# Patient Record
Sex: Male | Born: 1975 | Race: Black or African American | Hispanic: No | Marital: Single
Health system: Northeastern US, Academic
[De-identification: ages and names within clinical notes are randomized; demographics above are authoritative.]

## PROBLEM LIST (undated history)

## (undated) DIAGNOSIS — F32A Depression, unspecified: Secondary | ICD-10-CM

## (undated) DIAGNOSIS — F329 Major depressive disorder, single episode, unspecified: Secondary | ICD-10-CM

## (undated) DIAGNOSIS — R45851 Suicidal ideations: Secondary | ICD-10-CM

## (undated) DIAGNOSIS — F419 Anxiety disorder, unspecified: Secondary | ICD-10-CM

## (undated) DIAGNOSIS — F209 Schizophrenia, unspecified: Secondary | ICD-10-CM

## (undated) DIAGNOSIS — F3110 Bipolar disorder, current episode manic without psychotic features, unspecified: Secondary | ICD-10-CM

## (undated) HISTORY — PX: NO PAST SURGERIES: SHX2092

## (undated) HISTORY — DX: Anxiety disorder, unspecified: F41.9

## (undated) HISTORY — DX: Schizophrenia, unspecified: F20.9

## (undated) HISTORY — DX: Bipolar disorder, current episode manic without psychotic features, unspecified: F31.10

---

## 1997-10-25 ENCOUNTER — Emergency Department (HOSPITAL_COMMUNITY): Admission: EM | Admit: 1997-10-25 | Discharge: 1997-10-25 | Payer: Self-pay | Admitting: Emergency Medicine

## 2004-10-26 ENCOUNTER — Emergency Department (HOSPITAL_COMMUNITY): Admission: EM | Admit: 2004-10-26 | Discharge: 2004-10-26 | Payer: Self-pay | Admitting: Emergency Medicine

## 2004-12-25 ENCOUNTER — Emergency Department (HOSPITAL_COMMUNITY): Admission: EM | Admit: 2004-12-25 | Discharge: 2004-12-25 | Payer: Self-pay | Admitting: Emergency Medicine

## 2005-04-18 ENCOUNTER — Emergency Department (HOSPITAL_COMMUNITY): Admission: EM | Admit: 2005-04-18 | Discharge: 2005-04-18 | Payer: Self-pay | Admitting: Emergency Medicine

## 2005-06-05 ENCOUNTER — Emergency Department (HOSPITAL_COMMUNITY): Admission: EM | Admit: 2005-06-05 | Discharge: 2005-06-06 | Payer: Self-pay | Admitting: Emergency Medicine

## 2005-07-28 ENCOUNTER — Emergency Department (HOSPITAL_COMMUNITY): Admission: EM | Admit: 2005-07-28 | Discharge: 2005-07-28 | Payer: Self-pay | Admitting: Emergency Medicine

## 2007-07-17 ENCOUNTER — Other Ambulatory Visit: Payer: Self-pay

## 2007-07-17 ENCOUNTER — Inpatient Hospital Stay (HOSPITAL_COMMUNITY): Admission: AD | Admit: 2007-07-17 | Discharge: 2007-07-23 | Payer: Self-pay | Admitting: Psychiatry

## 2007-07-17 ENCOUNTER — Ambulatory Visit: Payer: Self-pay | Admitting: Psychiatry

## 2007-07-17 ENCOUNTER — Other Ambulatory Visit: Payer: Self-pay | Admitting: *Deleted

## 2007-07-28 ENCOUNTER — Emergency Department (HOSPITAL_COMMUNITY): Admission: EM | Admit: 2007-07-28 | Discharge: 2007-07-28 | Payer: Self-pay | Admitting: Emergency Medicine

## 2008-05-10 ENCOUNTER — Other Ambulatory Visit: Payer: Self-pay

## 2008-05-11 ENCOUNTER — Ambulatory Visit: Payer: Self-pay | Admitting: Psychiatry

## 2008-05-11 ENCOUNTER — Inpatient Hospital Stay (HOSPITAL_COMMUNITY): Admission: AD | Admit: 2008-05-11 | Discharge: 2008-05-15 | Payer: Self-pay | Admitting: Psychiatry

## 2008-05-18 ENCOUNTER — Emergency Department (HOSPITAL_COMMUNITY): Admission: EM | Admit: 2008-05-18 | Discharge: 2008-05-18 | Payer: Self-pay | Admitting: Emergency Medicine

## 2008-05-19 ENCOUNTER — Ambulatory Visit: Payer: Self-pay | Admitting: *Deleted

## 2008-05-19 ENCOUNTER — Emergency Department (HOSPITAL_COMMUNITY): Admission: EM | Admit: 2008-05-19 | Discharge: 2008-05-19 | Payer: Self-pay | Admitting: Emergency Medicine

## 2008-05-19 ENCOUNTER — Inpatient Hospital Stay (HOSPITAL_COMMUNITY): Admission: AD | Admit: 2008-05-19 | Discharge: 2008-05-22 | Payer: Self-pay | Admitting: *Deleted

## 2008-06-01 ENCOUNTER — Emergency Department (HOSPITAL_COMMUNITY): Admission: EM | Admit: 2008-06-01 | Discharge: 2008-06-02 | Payer: Self-pay | Admitting: Emergency Medicine

## 2008-06-07 ENCOUNTER — Emergency Department (HOSPITAL_COMMUNITY): Admission: EM | Admit: 2008-06-07 | Discharge: 2008-06-08 | Payer: Self-pay | Admitting: Emergency Medicine

## 2009-03-21 ENCOUNTER — Other Ambulatory Visit: Payer: Self-pay | Admitting: Emergency Medicine

## 2009-03-21 ENCOUNTER — Inpatient Hospital Stay (HOSPITAL_COMMUNITY): Admission: RE | Admit: 2009-03-21 | Discharge: 2009-03-29 | Payer: Self-pay | Admitting: Psychiatry

## 2009-03-21 ENCOUNTER — Ambulatory Visit: Payer: Self-pay | Admitting: Psychiatry

## 2009-04-21 ENCOUNTER — Inpatient Hospital Stay (HOSPITAL_COMMUNITY): Admission: RE | Admit: 2009-04-21 | Discharge: 2009-04-27 | Payer: Self-pay | Admitting: Psychiatry

## 2009-04-21 ENCOUNTER — Ambulatory Visit: Payer: Self-pay | Admitting: Psychiatry

## 2009-08-26 ENCOUNTER — Ambulatory Visit: Payer: Self-pay | Admitting: Psychiatry

## 2009-08-26 ENCOUNTER — Inpatient Hospital Stay (HOSPITAL_COMMUNITY): Admission: RE | Admit: 2009-08-26 | Discharge: 2009-09-03 | Payer: Self-pay | Admitting: Psychiatry

## 2009-09-10 ENCOUNTER — Other Ambulatory Visit: Payer: Self-pay | Admitting: Emergency Medicine

## 2009-09-10 ENCOUNTER — Ambulatory Visit (HOSPITAL_COMMUNITY): Admission: RE | Admit: 2009-09-10 | Discharge: 2009-09-10 | Payer: Self-pay | Admitting: Psychiatry

## 2009-09-12 ENCOUNTER — Inpatient Hospital Stay (HOSPITAL_COMMUNITY): Admission: RE | Admit: 2009-09-12 | Discharge: 2009-09-15 | Payer: Self-pay | Admitting: Psychiatry

## 2009-10-08 ENCOUNTER — Emergency Department (HOSPITAL_COMMUNITY): Admission: EM | Admit: 2009-10-08 | Discharge: 2009-10-08 | Payer: Self-pay | Admitting: Emergency Medicine

## 2009-10-15 ENCOUNTER — Other Ambulatory Visit: Payer: Self-pay | Admitting: Emergency Medicine

## 2009-10-15 ENCOUNTER — Ambulatory Visit (HOSPITAL_COMMUNITY): Admission: RE | Admit: 2009-10-15 | Discharge: 2009-10-15 | Payer: Self-pay | Admitting: Psychiatry

## 2009-10-20 ENCOUNTER — Inpatient Hospital Stay (HOSPITAL_COMMUNITY): Admission: RE | Admit: 2009-10-20 | Discharge: 2009-10-23 | Payer: Self-pay | Admitting: Psychiatry

## 2009-10-20 ENCOUNTER — Ambulatory Visit: Payer: Self-pay | Admitting: Psychiatry

## 2009-11-05 ENCOUNTER — Emergency Department (HOSPITAL_COMMUNITY): Admission: EM | Admit: 2009-11-05 | Discharge: 2009-11-05 | Payer: Self-pay | Admitting: Emergency Medicine

## 2009-11-07 ENCOUNTER — Emergency Department (HOSPITAL_COMMUNITY): Admission: EM | Admit: 2009-11-07 | Discharge: 2009-11-07 | Payer: Self-pay | Admitting: Emergency Medicine

## 2009-11-10 ENCOUNTER — Ambulatory Visit (HOSPITAL_COMMUNITY): Admission: RE | Admit: 2009-11-10 | Discharge: 2009-11-10 | Payer: Self-pay | Admitting: Psychiatry

## 2009-11-16 ENCOUNTER — Emergency Department (HOSPITAL_COMMUNITY)
Admission: EM | Admit: 2009-11-16 | Discharge: 2009-11-18 | Payer: Self-pay | Source: Home / Self Care | Admitting: Emergency Medicine

## 2009-11-16 ENCOUNTER — Ambulatory Visit (HOSPITAL_COMMUNITY)
Admission: RE | Admit: 2009-11-16 | Discharge: 2009-11-16 | Payer: Self-pay | Source: Home / Self Care | Admitting: Psychiatry

## 2009-11-18 ENCOUNTER — Emergency Department (HOSPITAL_COMMUNITY): Admission: EM | Admit: 2009-11-18 | Discharge: 2009-11-19 | Payer: Self-pay | Admitting: Emergency Medicine

## 2009-11-20 ENCOUNTER — Emergency Department (HOSPITAL_COMMUNITY)
Admission: EM | Admit: 2009-11-20 | Discharge: 2009-11-21 | Payer: Self-pay | Source: Home / Self Care | Admitting: Emergency Medicine

## 2010-02-26 ENCOUNTER — Emergency Department (HOSPITAL_COMMUNITY)
Admission: EM | Admit: 2010-02-26 | Discharge: 2010-02-26 | Discharge status: Against Medical Advice | Payer: Self-pay | Attending: Emergency Medicine | Admitting: Emergency Medicine

## 2010-02-26 DIAGNOSIS — Z0389 Encounter for observation for other suspected diseases and conditions ruled out: Secondary | ICD-10-CM | POA: Insufficient documentation

## 2010-03-16 LAB — SEDIMENTATION RATE: Sed Rate: 1 mm/hr (ref 0–16)

## 2010-03-16 LAB — DIFFERENTIAL
Basophils Absolute: 0.1 10*3/uL (ref 0.0–0.1)
Basophils Absolute: 0 10*3/uL (ref 0.0–0.1)
Basophils Relative: 1 % (ref 0–1)
Basophils Relative: 1 % (ref 0–1)
Eosinophils Absolute: 0.1 10*3/uL (ref 0.0–0.7)
Eosinophils Relative: 2 % (ref 0–5)
Lymphocytes Relative: 42 % (ref 12–46)
Neutro Abs: 2.8 10*3/uL (ref 1.7–7.7)
Neutrophils Relative %: 47 % (ref 43–77)
Neutrophils Relative %: 53 % (ref 43–77)

## 2010-03-16 LAB — POCT I-STAT, CHEM 8
Calcium, Ion: 1.14 mmol/L (ref 1.12–1.32)
HCT: 52 % (ref 39.0–52.0)
Hemoglobin: 17.7 g/dL — ABNORMAL HIGH (ref 13.0–17.0)
Sodium: 139 mEq/L (ref 135–145)
TCO2: 25 mmol/L (ref 0–100)

## 2010-03-16 LAB — COMPREHENSIVE METABOLIC PANEL
Alkaline Phosphatase: 63 U/L (ref 39–117)
BUN: 9 mg/dL (ref 6–23)
CO2: 27 mEq/L (ref 19–32)
Chloride: 105 mEq/L (ref 96–112)
Creatinine, Ser: 0.97 mg/dL (ref 0.4–1.5)
GFR calc non Af Amer: >60 mL/min (ref >60)
Glucose, Bld: 94 mg/dL (ref 70–99)
Potassium: 3.9 mEq/L (ref 3.5–5.1)
Total Bilirubin: 0.8 mg/dL (ref 0.3–1.2)

## 2010-03-16 LAB — RAPID URINE DRUG SCREEN, HOSP PERFORMED
Amphetamines: NOT DETECTED
Barbiturates: NOT DETECTED
Benzodiazepines: NOT DETECTED
Benzodiazepines: NOT DETECTED
Cocaine: NOT DETECTED
Cocaine: NOT DETECTED

## 2010-03-16 LAB — BASIC METABOLIC PANEL
BUN: 9 mg/dL (ref 6–23)
CO2: 28 mEq/L (ref 19–32)
Calcium: 9.9 mg/dL (ref 8.4–10.5)
Chloride: 108 mEq/L (ref 96–112)
Creatinine, Ser: 0.9 mg/dL (ref 0.4–1.5)
GFR calc Af Amer: >60 mL/min (ref >60)

## 2010-03-16 LAB — CBC
HCT: 45.4 % (ref 39.0–52.0)
Hemoglobin: 15.5 g/dL (ref 13.0–17.0)
MCH: 30.8 pg (ref 26.0–34.0)
MCH: 31.2 pg (ref 26.0–34.0)
MCV: 89.9 fL (ref 78.0–100.0)
MCV: 89.9 fL (ref 78.0–100.0)
Platelets: 166 10*3/uL (ref 150–400)
Platelets: 162 10*3/uL (ref 150–400)
RBC: 5.05 MIL/uL (ref 4.22–5.81)
RBC: 5.13 MIL/uL (ref 4.22–5.81)
RDW: 13.9 % (ref 11.5–15.5)
WBC: 6 10*3/uL (ref 4.0–10.5)

## 2010-03-16 LAB — VALPROIC ACID LEVEL: Valproic Acid Lvl: <10 ug/mL — ABNORMAL LOW (ref 50.0–100.0)

## 2010-03-16 LAB — ETHANOL: Alcohol, Ethyl (B): <5 mg/dL (ref 0–10)

## 2010-03-18 LAB — DIFFERENTIAL
Basophils Absolute: 0 10*3/uL (ref 0.0–0.1)
Basophils Relative: 1 % (ref 0–1)
Eosinophils Absolute: 0.1 10*3/uL (ref 0.0–0.7)
Monocytes Absolute: 0.4 10*3/uL (ref 0.1–1.0)
Neutro Abs: 3.2 10*3/uL (ref 1.7–7.7)
Neutrophils Relative %: 55 % (ref 43–77)

## 2010-03-18 LAB — CBC
HCT: 41.6 % (ref 39.0–52.0)
HCT: 44 % (ref 39.0–52.0)
Hemoglobin: 14.3 g/dL (ref 13.0–17.0)
Hemoglobin: 15.2 g/dL (ref 13.0–17.0)
MCH: 31.2 pg (ref 26.0–34.0)
MCHC: 34.4 g/dL (ref 30.0–36.0)
MCHC: 34.5 g/dL (ref 30.0–36.0)
RBC: 4.57 MIL/uL (ref 4.22–5.81)
RBC: 4.87 MIL/uL (ref 4.22–5.81)
RDW: 14.4 % (ref 11.5–15.5)
WBC: 5.3 10*3/uL (ref 4.0–10.5)
WBC: 5.8 10*3/uL (ref 4.0–10.5)

## 2010-03-18 LAB — ETHANOL
Alcohol, Ethyl (B): <5 mg/dL (ref 0–10)
Alcohol, Ethyl (B): <5 mg/dL (ref 0–10)

## 2010-03-18 LAB — BASIC METABOLIC PANEL
Chloride: 104 mEq/L (ref 96–112)
Creatinine, Ser: 1 mg/dL (ref 0.4–1.5)
GFR calc Af Amer: >60 mL/min (ref >60)
Potassium: 3.7 mEq/L (ref 3.5–5.1)
Sodium: 138 mEq/L (ref 135–145)

## 2010-03-18 LAB — RAPID URINE DRUG SCREEN, HOSP PERFORMED
Amphetamines: NOT DETECTED
Barbiturates: NOT DETECTED
Cocaine: NOT DETECTED
Opiates: NOT DETECTED
Tetrahydrocannabinol: NOT DETECTED

## 2010-03-18 LAB — COMPREHENSIVE METABOLIC PANEL
ALT: 25 U/L (ref 0–53)
AST: 29 U/L (ref 0–37)
Alkaline Phosphatase: 57 U/L (ref 39–117)
CO2: 26 mEq/L (ref 19–32)
Chloride: 109 mEq/L (ref 96–112)
GFR calc Af Amer: >60 mL/min (ref >60)
GFR calc non Af Amer: >60 mL/min (ref >60)
Glucose, Bld: 100 mg/dL — ABNORMAL HIGH (ref 70–99)
Potassium: 3.6 mEq/L (ref 3.5–5.1)
Sodium: 141 mEq/L (ref 135–145)
Total Bilirubin: 0.7 mg/dL (ref 0.3–1.2)

## 2010-03-18 LAB — VALPROIC ACID LEVEL: Valproic Acid Lvl: <10 ug/mL — ABNORMAL LOW (ref 50.0–100.0)

## 2010-03-19 LAB — CBC
Hemoglobin: 14.9 g/dL (ref 13.0–17.0)
MCH: 31.3 pg (ref 26.0–34.0)
MCHC: 34.7 g/dL (ref 30.0–36.0)
MCV: 90.3 fL (ref 78.0–100.0)
Platelets: 137 10*3/uL — ABNORMAL LOW (ref 150–400)
RBC: 4.75 MIL/uL (ref 4.22–5.81)

## 2010-03-19 LAB — VALPROIC ACID LEVEL: Valproic Acid Lvl: 89.4 ug/mL (ref 50.0–100.0)

## 2010-03-19 LAB — URINALYSIS, ROUTINE W REFLEX MICROSCOPIC
Bilirubin Urine: NEGATIVE
Hgb urine dipstick: NEGATIVE
Nitrite: NEGATIVE
Specific Gravity, Urine: 1.022 (ref 1.005–1.030)
Urobilinogen, UA: 0.2 mg/dL (ref 0.0–1.0)

## 2010-03-19 LAB — URINE DRUGS OF ABUSE SCREEN W ALC, ROUTINE (REF LAB)
Benzodiazepines.: NEGATIVE
Ethyl Alcohol: <10 mg/dL (ref <10)
Marijuana Metabolite: NEGATIVE
Opiate Screen, Urine: NEGATIVE
Phencyclidine (PCP): NEGATIVE
Propoxyphene: NEGATIVE

## 2010-03-19 LAB — HEPATIC FUNCTION PANEL
Albumin: 3.6 g/dL (ref 3.5–5.2)
Indirect Bilirubin: 0.6 mg/dL (ref 0.3–0.9)
Total Protein: 6.9 g/dL (ref 6.0–8.3)

## 2010-03-19 LAB — COMPREHENSIVE METABOLIC PANEL
BUN: 14 mg/dL (ref 6–23)
CO2: 27 mEq/L (ref 19–32)
Calcium: 9 mg/dL (ref 8.4–10.5)
Chloride: 109 mEq/L (ref 96–112)
Creatinine, Ser: 1.02 mg/dL (ref 0.4–1.5)
GFR calc non Af Amer: >60 mL/min (ref >60)
Total Bilirubin: 0.7 mg/dL (ref 0.3–1.2)

## 2010-03-23 LAB — CBC
HCT: 44 % (ref 39.0–52.0)
MCV: 92 fL (ref 78.0–100.0)
Platelets: 165 10*3/uL (ref 150–400)
RBC: 4.78 MIL/uL (ref 4.22–5.81)
WBC: 5.3 10*3/uL (ref 4.0–10.5)

## 2010-03-23 LAB — COMPREHENSIVE METABOLIC PANEL
AST: 17 U/L (ref 0–37)
Albumin: 3.6 g/dL (ref 3.5–5.2)
Alkaline Phosphatase: 49 U/L (ref 39–117)
BUN: 12 mg/dL (ref 6–23)
CO2: 29 mEq/L (ref 19–32)
Chloride: 106 mEq/L (ref 96–112)
GFR calc non Af Amer: >60 mL/min (ref >60)
Potassium: 4.1 mEq/L (ref 3.5–5.1)
Total Bilirubin: 0.7 mg/dL (ref 0.3–1.2)

## 2010-03-28 LAB — URINALYSIS, ROUTINE W REFLEX MICROSCOPIC
Nitrite: NEGATIVE
Specific Gravity, Urine: 1.038 — ABNORMAL HIGH (ref 1.005–1.030)
Urobilinogen, UA: 1 mg/dL (ref 0.0–1.0)

## 2010-03-28 LAB — BASIC METABOLIC PANEL
CO2: 29 mEq/L (ref 19–32)
Calcium: 9 mg/dL (ref 8.4–10.5)
Creatinine, Ser: 0.86 mg/dL (ref 0.4–1.5)
GFR calc Af Amer: >60 mL/min (ref >60)
GFR calc non Af Amer: >60 mL/min (ref >60)
Glucose, Bld: 102 mg/dL — ABNORMAL HIGH (ref 70–99)

## 2010-03-28 LAB — RAPID URINE DRUG SCREEN, HOSP PERFORMED
Barbiturates: NOT DETECTED
Opiates: NOT DETECTED
Tetrahydrocannabinol: NOT DETECTED

## 2010-03-28 LAB — DIFFERENTIAL
Basophils Absolute: 0.1 10*3/uL (ref 0.0–0.1)
Basophils Relative: 1 % (ref 0–1)
Neutro Abs: 6.2 10*3/uL (ref 1.7–7.7)
Neutrophils Relative %: 69 % (ref 43–77)

## 2010-03-28 LAB — CBC
MCHC: 33.4 g/dL (ref 30.0–36.0)
RDW: 13.6 % (ref 11.5–15.5)

## 2010-04-13 LAB — DIFFERENTIAL
Basophils Relative: 1 % (ref 0–1)
Basophils Relative: 1 % (ref 0–1)
Eosinophils Absolute: 0.3 10*3/uL (ref 0.0–0.7)
Eosinophils Relative: 4 % (ref 0–5)
Lymphocytes Relative: 28 % (ref 12–46)
Lymphocytes Relative: 45 % (ref 12–46)
Lymphs Abs: 3 10*3/uL (ref 0.7–4.0)
Lymphs Abs: 2.5 10*3/uL (ref 0.7–4.0)
Monocytes Absolute: 0.5 10*3/uL (ref 0.1–1.0)
Monocytes Absolute: 0.6 10*3/uL (ref 0.1–1.0)
Monocytes Relative: 8 % (ref 3–12)
Monocytes Relative: 10 % (ref 3–12)
Neutro Abs: 3.8 10*3/uL (ref 1.7–7.7)
Neutro Abs: 2.7 10*3/uL (ref 1.7–7.7)

## 2010-04-13 LAB — URINALYSIS, ROUTINE W REFLEX MICROSCOPIC
Bilirubin Urine: NEGATIVE
Bilirubin Urine: NEGATIVE
Hgb urine dipstick: NEGATIVE
Ketones, ur: NEGATIVE mg/dL
Nitrite: NEGATIVE
Specific Gravity, Urine: 1.026 (ref 1.005–1.030)
Urobilinogen, UA: 1 mg/dL (ref 0.0–1.0)
pH: 5.5 (ref 5.0–8.0)

## 2010-04-13 LAB — POCT I-STAT, CHEM 8
BUN: 18 mg/dL (ref 6–23)
Chloride: 105 mEq/L (ref 96–112)
HCT: 48 % (ref 39.0–52.0)
Sodium: 142 mEq/L (ref 135–145)
TCO2: 27 mmol/L (ref 0–100)

## 2010-04-13 LAB — CBC
HCT: 44.8 % (ref 39.0–52.0)
HCT: 40.6 % (ref 39.0–52.0)
Hemoglobin: 15.6 g/dL (ref 13.0–17.0)
Hemoglobin: 14.3 g/dL (ref 13.0–17.0)
MCHC: 34.7 g/dL (ref 30.0–36.0)
MCHC: 35.2 g/dL (ref 30.0–36.0)
MCV: 89.6 fL (ref 78.0–100.0)
Platelets: 181 10*3/uL (ref 150–400)
RBC: 5.05 MIL/uL (ref 4.22–5.81)
RBC: 4.54 MIL/uL (ref 4.22–5.81)
WBC: 6.8 10*3/uL (ref 4.0–10.5)
WBC: 6 10*3/uL (ref 4.0–10.5)

## 2010-04-13 LAB — BASIC METABOLIC PANEL
BUN: 8 mg/dL (ref 6–23)
CO2: 30 mEq/L (ref 19–32)
CO2: 26 mEq/L (ref 19–32)
Calcium: 10 mg/dL (ref 8.4–10.5)
Calcium: 9.1 mg/dL (ref 8.4–10.5)
Chloride: 109 mEq/L (ref 96–112)
Creatinine, Ser: 0.84 mg/dL (ref 0.4–1.5)
GFR calc Af Amer: >60 mL/min (ref >60)
GFR calc Af Amer: >60 mL/min (ref >60)
Glucose, Bld: 82 mg/dL (ref 70–99)
Sodium: 147 mEq/L — ABNORMAL HIGH (ref 135–145)

## 2010-04-13 LAB — RAPID URINE DRUG SCREEN, HOSP PERFORMED
Amphetamines: NOT DETECTED
Amphetamines: NOT DETECTED
Barbiturates: NOT DETECTED
Benzodiazepines: NOT DETECTED
Benzodiazepines: NOT DETECTED
Cocaine: NOT DETECTED
Cocaine: NOT DETECTED
Opiates: NOT DETECTED
Opiates: NOT DETECTED
Tetrahydrocannabinol: NOT DETECTED
Tetrahydrocannabinol: NOT DETECTED

## 2010-04-13 LAB — ETHANOL: Alcohol, Ethyl (B): <5 mg/dL (ref 0–10)

## 2010-05-18 NOTE — H&P (Signed)
NAME:  Erik [name], Erik [name] NO.:  [id number]   MEDICAL RECORD NO.:  [id number]          PATIENT TYPE:  IPS   LOCATION:  0407                          FACILITY:  BH   PHYSICIAN:  Anselm Jungling, MD  DATE OF BIRTH:  [birth year]   DATE OF ADMISSION:  07/17/2007  DATE OF DISCHARGE:                       PSYCHIATRIC ADMISSION ASSESSMENT   This is a 35 year old male involuntarily committed on 07/17/07.   HISTORY OF PRESENT ILLNESS:  The patient is here on petition papers.  Papers state the patient is acutely psychotic, refusing labs and  threatening to leave and is clearly a danger to himself and others.  The  patient states that he brought himself here and he is here to help get  his money back.  Denies any hallucinations or suicidal thoughts.   PAST PSYCHIATRIC HISTORY:  First admission to [facility].  No current outpatient mental health treatment.   SOCIAL HISTORY:  A 35 year old male, homeless. Remainder of social  history is unclear.   FAMILY HISTORY:  Unknown.   ALCOHOL AND DRUG HISTORY:  Denies any alcohol or drug use.   PRIMARY CARE Larance Ratledge:  None.   MEDICAL PROBLEMS:  Denies any acute or chronic health issues.   MEDICATIONS:  None prior to arrival.   DRUG ALLERGIES:  No known allergies.   PHYSICAL EXAM:  GENERAL:  This is a healthy-appearing male who was  assessed at [facility] Emergency Department.  His physical exam was  reviewed.  VITAL SIGNS:  Temperature 97.4, 88 heart rate, 60 respirations, blood  pressure is 100/62, 5 feet 9 inches tall and 174 pounds, 99% saturated.   In the ER the patient was laughing to himself.  He was tangential and  malodorous.  He did receive Ativan.   LABORATORY DATA:  Shows a platelet count 142.  Acetaminophen level less  than 10.  Salicylate level less than four.  Urine drug screen is  negative.  Valproic acid less than 10, potassium of 3.4.   MENTAL STATUS EXAM:  The patient is resting in bed.  He  is fully alert,  good eye contact.  Thought process, he just makes a couple statements  about wanting to get his money back.  He seems aware of himself, unclear  about the situation.  He is a poor historian.   AXIS I:  Psychotic disorder NOS, rule out polysubstance abuse.  AXIS II:  Deferred.  AXIS III:  No known medical conditions.  AXIS IV:  Problems with housing and possible other psychosocial  problems.  AXIS V:  Current is 30.   PLAN:  Agricultural engineer.  Stabilize mood and thinking.  Will have Zyprexa  available at bedtime with some p.r.n. doses of Zyprexa and Ativan.  Will  continue to gather more history and identify supports. His tentative  length of stay at this time is consider to be 3-5 days.      Landry Corporal, N.P.      Anselm Jungling, MD  Electronically Signed    JO/MEDQ  D:  07/18/2007  T:  07/18/2007  Job:  [phone]

## 2010-05-18 NOTE — Discharge Summary (Signed)
NAME:  Erik, [name] NO.:  [id number]   MEDICAL RECORD NO.:  [id number]          PATIENT TYPE:  IPS   LOCATION:  0403                          FACILITY:  BH   PHYSICIAN:  Anselm Jungling, MD  DATE OF BIRTH:  [birth year]   DATE OF ADMISSION:  05/11/2008  DATE OF DISCHARGE:  05/15/2008                               DISCHARGE SUMMARY   IDENTIFYING DATA AND REASON FOR ADMISSION:  This was an inpatient  psychiatric admission for Erik [name], a 35 year old African American male who  was admitted with a psychotic disorder and history of schizophrenia.  Please refer to the admission note for further details pertaining to the  symptoms, circumstances, and history that led to his hospitalization.  He was given an initial Axis I diagnosis of schizophrenia versus  psychosis, NOS.   MEDICAL AND LABORATORY:  The patient was medically and physically  assessed by the psychiatric nurse practitioner.  He was in generally  good health without any active or chronic medical problems.  There were  no significant medical issues.   HOSPITAL COURSE:  The patient was admitted to the adult inpatient  psychiatric service.  He presented as a well-nourished, normally-  developed, adult male who was quite withdrawn, guarded, and preferred to  remain in bed during the first several days of his hospital stay.  He  was not very communicative.   He was initially evaluated by Dr. Geoffery Lyons.  The undersigned assumed  his care on the 3rd hospital day.  On that date, the patient was  somewhat groggy but reported that he felt that his medications were  satisfactory.  At that time, he was on a regimen of Haldol and Depakote.  Once he was awake for a few minutes, he did not appear sedated.  He  complained of low back pain for which he was given Lidoderm 5% dermal  patch with reasonably good results.   The patient continued somewhat isolative and uninterested in  participating in the milieu program.   He did get up for meals but  otherwise stayed to himself.   The patient was up, dressed, and active on the unit on the 4th and 5th  hospital days.  He was still not very forthcoming with personal  information and not easy to engage verbally but he indicated that he  felt better and felt ready for discharge back to the community.   We learned that he had been last seen at [facility]  in February of 2009.  Following that he had been referred to an  assertive community treatment team with whom he was not cooperative.  He  had another appointment at [facility] in March of  this year for followup subsequent to an inpatient psychiatric  hospitalization at [facility].  He no showed for that  appointment.   The patient agreed to the following discharge and aftercare plan.  The  patient was to follow up with [facility] with an  appointment to see their psychiatrist on June 03, 2008, at 8:30 a.m.  DISCHARGE MEDICATIONS:  1. Haldol 5 mg q.h.s.  2. Depakote 1000 mg q.h.s.  3. Cogentin 1 mg p.o. b.i.d.   DISCHARGE DIAGNOSES:  AXIS I:  Schizophrenia, chronic paranoid type,  acute exacerbation, resolving.  AXIS II:  Deferred.  AXIS III:  No acute or chronic illnesses.  AXIS IV:  Stressors, severe.  AXIS V:  Global Assessment of Functioning on discharge 50.      Anselm Jungling, MD  Electronically Signed     SPB/MEDQ  D:  05/19/2008  T:  05/19/2008  Job:  [id number]

## 2010-05-21 NOTE — Discharge Summary (Signed)
NAME:  Erik [name], Erik [name] NO.:  [id number]   MEDICAL RECORD NO.:  [id number]          PATIENT TYPE:  IPS   LOCATION:  0407                          FACILITY:  BH   PHYSICIAN:  Anselm Jungling, MD  DATE OF BIRTH:  [birth year]   DATE OF ADMISSION:  07/17/2007  DATE OF DISCHARGE:  07/23/2007                               DISCHARGE SUMMARY   IDENTIFYING DATA/REASON FOR ADMISSION:  The patient is a 35 year old  single African American male who is homeless.  He was admitted with  psychotic symptoms on an involuntary basis.  He had been on no  medications.  He was a poor historian.  Please refer to the admission  note for further details pertaining to the symptoms, circumstances and  history that led to his hospitalization.  He was given an initial Axis I  diagnosis of psychosis NOS.   MEDICAL AND LABORATORY:  The patient was medically and physically  assessed by the psychiatric nurse practitioner.  He was in good health  without any active or chronic medical problems.  There were no  significant medical issues.   HOSPITAL COURSE:  The patient was admitted to the adult inpatient  psychiatric service.  He presented as a thin, but adequately nourished  [place] male, who although alert and awake, and appearing to be  fully oriented, stated that the reason that he was hospitalized was to  get help getting my money.  He really had no insight into the reason  for his hospitalization initially, and felt that he was there in order  to get some sort of social work help with getting his disability  benefits for welfare money.   He generally was isolative and withdrawn during the initial portion of  his inpatient stay, preferring to remain in bed most of the time.   He was treated with a regimen of Zyprexa 5 mg q.h.s.  Because of the  apparent affective component to his disorder and his level of  irritability.  He was also placed on Depakote ER.  Over the course of  his hospital stay, he became less reclusive and isolative.  He  eventually was able to provide more specific and important history.  He  acknowledged that medication was helpful to him.  He stated that the  medications that he had been on both with [place], and in prior courses of  psychiatric treatment helps to calm everything down.   He told us that he was being followed by the [facility] community  support team, which is based in [place].  He was able to tell us the  name of his case manager there, and we were able to contact that  program.   Following this, the patient continued to improve, and he had no further  subjective complaints, denied psychotic symptoms, and sleep and appetite  were stable.  He agreed to the following aftercare plan.   AFTERCARE:  The patient was to follow-up with the [facility] ACT team.  In  fact, a representative from that agency was to pick the patient up from  our facility on the date of discharge, July 20, 2007.  They were to see  to provision of all of his outpatient treatment needs.   DISCHARGE MEDICATIONS:  1. Depakote ER 500 mg b.i.d.  2. Zyprexa 5 mg q.h.s..   DISCHARGE DIAGNOSIS:  AXIS I:  Bipolar disorder NOS.  AXIS II:  Deferred.  AXIS III:  No acute or chronic illnesses.  AXIS IV:  Stressors severe.  AXIS V:  GAF on discharge 50.      Anselm Jungling, MD  Electronically Signed     SPB/MEDQ  D:  08/13/2007  T:  08/13/2007  Job:  [id number]

## 2010-05-21 NOTE — Discharge Summary (Signed)
NAME:  KARINA, LENDERMAN NO.:  [id number]   MEDICAL RECORD NO.:  [id number]          PATIENT TYPE:  IPS   LOCATION:  0302                          FACILITY:  BH   PHYSICIAN:  Jasmine Pang, M.D. DATE OF BIRTH:  12/19/1975   DATE OF ADMISSION:  05/19/2008  DATE OF DISCHARGE:  05/22/2008                               DISCHARGE SUMMARY   IDENTIFICATION:  This is a 35 year old single African American male from  [place], who was admitted on a voluntary basis.   HISTORY OF PRESENT ILLNESS:  The patient was just released from [facility] a  weeks ago.  He went to the [employer].  He was sent from the homeless  shelter due to disorganized behavior and constant masturbation.  He  states he cannot take his medications at the homeless shelter because  people are watching.  He came to the emergency department and stated  that shelter was terrible.  He has no family support on him.  He stated  I am trying to get myself back together.  For further admission  information, see psychiatric admission assessment.   PHYSICAL FINDINGS:  There were no acute physical or medical problems  noted.  A complete physical exam was done in the ED prior to transfer to  our unit.   HOSPITAL COURSE:  Upon admission, the patient was started on his home  medications of Depakote 500 mg p.o. q.h.s.  He was also started on  Haldol 5 mg p.o. q.6 h. p.r.n. psychosis, and Cogentin 1 mg p.o. b.i.d.  He was also started on Ambien 10 mg p.o. q.h.s. p.r.n. and Ativan 1 mg  p.o. t.i.d. p.r.n. agitation.  In individual sessions, the patient  initially presented as disheveled with minimal eye contact.  There was  positive psychomotor retardation.  Speech was soft and slow.  Mood was  depressed and anxious.  Affect consistent with mood.  There was no  suicidal or homicidal ideation.  There was no evidence of psychosis or  thought disorder.  He stated that he would like an assisted-living  facility.  He stayed  in bed much of the time.  His sleep was good  however, and this hospitalization progressed.  His mood was less  depressed, less anxious.  His thoughts remained somewhat slowed and he  continued to focus on going to assisted living.  On May 22, 2008, mental  status had improved.  The patient excepted [facility].  He was  happy about this.  Transition mood was less depressed and anxious.  Affect was consistent with mood.  There was no suicidal or homicidal  ideation.  No thoughts of self-injurious behavior.  No auditory or  visual hallucinations.  No paranoia or delusions.  Thoughts were logical  and goal-directed, thought content.  No predominant theme.  Insight is  fair, judgment fair, impulse control good.  It was felt the patient was  safe for discharge today and was going to transition to [facility].   DISCHARGE DIAGNOSES:  Axis I:  Schizophrenia, paranoid type.  Axis II:  None.  Axis III:  No acute or chronic health problems.  Axis IV:  Severe (problems with primary support group, housing problem,  economic problem, burden of psychiatric illness, occupational problem).  Axis V:  Global assessment of functioning was 50 upon discharge.  GAF  was 35 upon admission.  GAF highest past year was 60-65.   DISCHARGE PLANS:  There was no specific activity level or dietary  restrictions.   POSTHOSPITAL CARE PLANS:  The patient will go to [facility] on June 03, 2008, at 8:30 a.m.   DISCHARGE MEDICATIONS:  1. Seroquel 50 mg twice a day and Seroquel 200 mg at bedtime.  2. Depakote 500 mg at bedtime.   Haldol was discontinued since the Seroquel was begun.      Jasmine Pang, M.D.  Electronically Signed     BHS/MEDQ  D:  05/24/2008  T:  05/24/2008  Job:  [id number]

## 2010-07-06 ENCOUNTER — Emergency Department (HOSPITAL_COMMUNITY)
Admission: EM | Admit: 2010-07-06 | Discharge: 2010-07-06 | Disposition: A | Discharge status: Home / Self Care | Payer: Medicaid Other | Attending: Emergency Medicine | Admitting: Emergency Medicine

## 2010-07-06 DIAGNOSIS — F329 Major depressive disorder, single episode, unspecified: Secondary | ICD-10-CM | POA: Insufficient documentation

## 2010-07-06 DIAGNOSIS — F3289 Other specified depressive episodes: Secondary | ICD-10-CM | POA: Insufficient documentation

## 2010-07-06 DIAGNOSIS — F411 Generalized anxiety disorder: Secondary | ICD-10-CM | POA: Insufficient documentation

## 2010-07-14 ENCOUNTER — Emergency Department (HOSPITAL_COMMUNITY)
Admission: EM | Admit: 2010-07-14 | Discharge: 2010-07-15 | Disposition: A | Discharge status: Other Acute Inpatient Hospital | Payer: Medicaid Other | Source: Home / Self Care | Attending: Emergency Medicine | Admitting: Emergency Medicine

## 2010-07-14 DIAGNOSIS — F3289 Other specified depressive episodes: Secondary | ICD-10-CM | POA: Insufficient documentation

## 2010-07-14 DIAGNOSIS — F329 Major depressive disorder, single episode, unspecified: Secondary | ICD-10-CM | POA: Insufficient documentation

## 2010-07-14 DIAGNOSIS — R45851 Suicidal ideations: Secondary | ICD-10-CM | POA: Insufficient documentation

## 2010-07-14 LAB — BASIC METABOLIC PANEL
CO2: 28 mEq/L (ref 19–32)
Calcium: 9.6 mg/dL (ref 8.4–10.5)
Creatinine, Ser: 0.77 mg/dL (ref 0.50–1.35)
GFR calc non Af Amer: >60 mL/min (ref >60)
Glucose, Bld: 85 mg/dL (ref 70–99)
Sodium: 140 mEq/L (ref 135–145)

## 2010-07-14 LAB — CBC
HCT: 44.3 % (ref 39.0–52.0)
Hemoglobin: 14.7 g/dL (ref 13.0–17.0)
MCH: 29.5 pg (ref 26.0–34.0)
MCHC: 33.2 g/dL (ref 30.0–36.0)
RDW: 12.9 % (ref 11.5–15.5)

## 2010-07-14 LAB — DIFFERENTIAL
Basophils Absolute: 0 10*3/uL (ref 0.0–0.1)
Basophils Relative: 1 % (ref 0–1)
Eosinophils Relative: 1 % (ref 0–5)
Monocytes Absolute: 0.5 10*3/uL (ref 0.1–1.0)
Monocytes Relative: 8 % (ref 3–12)
Neutro Abs: 3.9 10*3/uL (ref 1.7–7.7)

## 2010-07-14 LAB — ETHANOL: Alcohol, Ethyl (B): 15 mg/dL — ABNORMAL HIGH (ref 0–11)

## 2010-07-14 LAB — URINALYSIS, ROUTINE W REFLEX MICROSCOPIC
Glucose, UA: NEGATIVE mg/dL
Leukocytes, UA: NEGATIVE
Protein, ur: 30 mg/dL — AB
Urobilinogen, UA: 1 mg/dL (ref 0.0–1.0)

## 2010-07-14 LAB — RAPID URINE DRUG SCREEN, HOSP PERFORMED
Amphetamines: NOT DETECTED
Tetrahydrocannabinol: NOT DETECTED

## 2010-07-14 LAB — URINE MICROSCOPIC-ADD ON

## 2010-07-15 ENCOUNTER — Inpatient Hospital Stay (HOSPITAL_COMMUNITY)
Admission: AD | Admit: 2010-07-15 | Discharge: 2010-07-22 | DRG: 885 | Disposition: A | Discharge status: Home / Self Care | Payer: Medicaid Other | Source: Ambulatory Visit | Attending: Psychiatry | Admitting: Psychiatry

## 2010-07-15 DIAGNOSIS — Z5900 Homelessness unspecified: Secondary | ICD-10-CM

## 2010-07-15 DIAGNOSIS — Z91199 Patient's noncompliance with other medical treatment and regimen due to unspecified reason: Secondary | ICD-10-CM

## 2010-07-15 DIAGNOSIS — R45851 Suicidal ideations: Secondary | ICD-10-CM

## 2010-07-15 DIAGNOSIS — Z560 Unemployment, unspecified: Secondary | ICD-10-CM

## 2010-07-15 DIAGNOSIS — F39 Unspecified mood [affective] disorder: Principal | ICD-10-CM

## 2010-07-15 DIAGNOSIS — F339 Major depressive disorder, recurrent, unspecified: Secondary | ICD-10-CM

## 2010-07-15 DIAGNOSIS — Z9119 Patient's noncompliance with other medical treatment and regimen: Secondary | ICD-10-CM

## 2010-07-16 DIAGNOSIS — F39 Unspecified mood [affective] disorder: Secondary | ICD-10-CM

## 2010-07-22 ENCOUNTER — Emergency Department (HOSPITAL_COMMUNITY)
Admission: EM | Admit: 2010-07-22 | Discharge: 2010-07-23 | Discharge status: Against Medical Advice | Payer: Medicaid Other | Attending: Emergency Medicine | Admitting: Emergency Medicine

## 2010-07-22 DIAGNOSIS — R079 Chest pain, unspecified: Secondary | ICD-10-CM | POA: Insufficient documentation

## 2010-07-22 DIAGNOSIS — R51 Headache: Secondary | ICD-10-CM | POA: Insufficient documentation

## 2010-07-23 ENCOUNTER — Emergency Department (HOSPITAL_COMMUNITY): Payer: Medicaid Other

## 2010-07-27 NOTE — Discharge Summary (Signed)
  NAME:  Erik [name], Erik [name] NO.:  [id number]  MEDICAL RECORD NO.:  [id number]  LOCATION:  0400                          FACILITY:  BH  PHYSICIAN:  Eulogio Ditch, MD DATE OF BIRTH:  April 15, 1975  DATE OF ADMISSION:  07/15/2010 DATE OF DISCHARGE:  07/22/2010                              DISCHARGE SUMMARY   HISTORY OF PRESENT ILLNESS:  A 35 year old African American male with history of depression who came to the ER as he verbalized suicidal ideations for the last 2 weeks with a plan to overdose on pills.  The patient was also frustrated because he was homeless.  The patient reported he was on Zoloft and Zyprexa but was noncompliant with these medications.  HOSPITAL COURSE:  The patient was admitted to [facility] and was started on 100 mg p.o. daily and Zyprexa 5 mg p.o. daily.  The patient responded to these medications well without any side effects.  The patient was also given Cogentin 1 mg at bedtime to prevent any side effects like EPS from Zyprexa.  Psychoeducation given to the patient regarding the side effects of Zyprexa that it can cause irreversible diabetes, but the patient reported to me that he feels good on Zyprexa and he wanted to continue that.  The patient was advised to follow up regularly for his blood sugar levels with primary care physician or with psychiatrist.  Throughout the stay in the hospital the patient participated in all the groups.  No agitated behavior was reported by the staff.  His insight and judgment improved throughout the stay in the hospital.  On 07/22/2010, when I saw the patient, the patient was very logical and goal-directed, pleasant on approach.  Denied hearing any voices.  Denied any suicidal or homicidal ideations.  The patient denied any side effects from the medications.  The patient got a better  and wanted to go today, as the patient is stable the patient was discharged to follow up in the outpatient  setting.  DISCHARGE MEDICATIONS:  Zyprexa 5 mg p.o. daily,    Zoloft 100 mg p.o. daily.  Diagnosis; 1 Mood disorder nos. 2 deferred 3 no active medical issue.  DISCHARGE FOLLOWUP:  The patient is going to follow up at [facility]  at phone number [phone].     Eulogio Ditch, MD     SA/MEDQ  D:  07/22/2010  T:  07/22/2010  Job:  [id number]  Electronically Signed by Eulogio Ditch  on 07/27/2010 09:41:36 AM

## 2010-07-27 NOTE — Assessment & Plan Note (Signed)
  NAME:  Erik [name], TURVEY NO.:  [id number]  MEDICAL RECORD NO.:  [id number]  LOCATION:  0400                          FACILITY:  BH  PHYSICIAN:  Eulogio Ditch, MD DATE OF BIRTH:  05/18/1975  DATE OF ADMISSION:  07/15/2010 DATE OF DISCHARGE:                      PSYCHIATRIC ADMISSION ASSESSMENT   REASON FOR ADMISSION:  Depression and suicidal ideations.  HISTORY OF PRESENT ILLNESS:  A 35 year old, African American male with a history of depression who was admitted from the ER as he verbalized suicidal ideations for the last 2 weeks with a plan to overdose on the pills.  The patient reported he is frustrated and stressed out because of not working and homeless.  The patient also reported he was not taking his medication Zoloft and Zyprexa.  The patient has a number of suicidal threats in the past.  SUBSTANCE ABUSE:  The patient denied abusing any drugs.  PAST PSYCH HISTORY:  The patient has multiple hospitalizations in the past at behavioral health but I do not see any records in the E-chart at this time.  PAST MEDICATIONS:  Prozac, Zoloft, Zyprexa, last use was in September 2011.  MEDICAL HISTORY:  No active medical issues.  ALLERGIES:  NO KNOWN DRUG ALLERGIES.  PHYSICAL EXAMINATION:  Done at [facility] within normal limits.  VITAL SIGNS:  Blood pressure 116/81, pulse 74, respiratory rate 18.  WBC count 6.4, hemoglobin 14.7, alcohol level 15.  Sodium 140, potassium 3.1, BUN 9, creatinine 0.77, UDS negative.  Physical examination done by Dr. Eber Hong in [place] ER within normal limits.  The patient was given potassium chloride 40 mg in the ER. MENTAL STATUS EXAM:  The patient is calm, cooperative with the interview.  Fair eye contact.  Guarded.  Speech normal in rate, rhythm and volume.  MOOD:  Depressed.  Affect and mood congruent.  Thought process logical and goal-directed.  Thought content:  The patient still has suicidal ideation with a  plan to overdose on the pills, not delusional.  Denies hearing voices.  Is not internally preoccupied. Cognition:  Alert, awake, oriented x3.  Memory immediate, recent and remote fair.  Attention and concentration poor.  Abstractionability fair.  Insight and judgment poor.  DIAGNOSIS:  Axis I:  Mood disorder, not otherwise specified. Rule out major depressive disorder, recurrent type. Axis II:  Deferred. Axis III:  No active medical issue. Axis IV:  Psychosocial stressors, homeless, unemployed. Axis V:  40.  TREATMENT PLAN: 1. The patient will be started on Zoloft 100 mg p.o. daily and Zyprexa     5 mg at bedtime. 2. We will get more collateral information on the patient. 3. Estimated length of stay in the hospital will be 6-7 days. 4. Side effects, risks and benefits of the medication discussed with     the patient.     Eulogio Ditch, MD     SA/MEDQ  D:  07/16/2010  T:  07/16/2010  Job:  [id number]  Electronically Signed by Eulogio Ditch  on 07/27/2010 09:38:35 AM

## 2010-07-28 ENCOUNTER — Emergency Department (HOSPITAL_COMMUNITY)
Admission: EM | Admit: 2010-07-28 | Discharge: 2010-07-28 | Disposition: A | Discharge status: Home / Self Care | Payer: Medicaid Other | Attending: Emergency Medicine | Admitting: Emergency Medicine

## 2010-07-28 DIAGNOSIS — F319 Bipolar disorder, unspecified: Secondary | ICD-10-CM | POA: Insufficient documentation

## 2010-07-28 LAB — CBC
Hemoglobin: 14.8 g/dL (ref 13.0–17.0)
MCHC: 33.5 g/dL (ref 30.0–36.0)
RBC: 5.03 MIL/uL (ref 4.22–5.81)

## 2010-07-28 LAB — COMPREHENSIVE METABOLIC PANEL
Albumin: 4.1 g/dL (ref 3.5–5.2)
BUN: 11 mg/dL (ref 6–23)
Chloride: 102 mEq/L (ref 96–112)
Creatinine, Ser: 0.74 mg/dL (ref 0.50–1.35)
GFR calc Af Amer: >60 mL/min (ref >60)
Glucose, Bld: 105 mg/dL — ABNORMAL HIGH (ref 70–99)
Total Bilirubin: 0.7 mg/dL (ref 0.3–1.2)

## 2010-07-28 LAB — RAPID URINE DRUG SCREEN, HOSP PERFORMED
Amphetamines: NOT DETECTED
Cocaine: NOT DETECTED
Opiates: NOT DETECTED
Tetrahydrocannabinol: NOT DETECTED

## 2010-07-28 LAB — DIFFERENTIAL
Basophils Absolute: 0 10*3/uL (ref 0.0–0.1)
Basophils Relative: 1 % (ref 0–1)
Neutro Abs: 3.9 10*3/uL (ref 1.7–7.7)
Neutrophils Relative %: 59 % (ref 43–77)

## 2010-07-28 LAB — ETHANOL: Alcohol, Ethyl (B): <11 mg/dL (ref 0–11)

## 2010-08-12 ENCOUNTER — Emergency Department (HOSPITAL_COMMUNITY)
Admission: EM | Admit: 2010-08-12 | Discharge: 2010-08-13 | Disposition: A | Discharge status: Other Acute Inpatient Hospital | Payer: Medicaid Other | Attending: Emergency Medicine | Admitting: Emergency Medicine

## 2010-08-13 ENCOUNTER — Emergency Department (HOSPITAL_COMMUNITY): Payer: Medicaid Other

## 2010-08-13 ENCOUNTER — Emergency Department (HOSPITAL_COMMUNITY)
Admission: EM | Admit: 2010-08-13 | Discharge: 2010-08-13 | Disposition: A | Discharge status: Home / Self Care | Payer: Medicaid Other | Attending: Emergency Medicine | Admitting: Emergency Medicine

## 2010-08-13 DIAGNOSIS — IMO0001 Reserved for inherently not codable concepts without codable children: Secondary | ICD-10-CM | POA: Insufficient documentation

## 2010-08-13 DIAGNOSIS — F319 Bipolar disorder, unspecified: Secondary | ICD-10-CM | POA: Insufficient documentation

## 2010-08-13 LAB — URINALYSIS, ROUTINE W REFLEX MICROSCOPIC
Leukocytes, UA: NEGATIVE
Nitrite: NEGATIVE
Specific Gravity, Urine: 1.031 — ABNORMAL HIGH (ref 1.005–1.030)
Urobilinogen, UA: 1 mg/dL (ref 0.0–1.0)
pH: 6 (ref 5.0–8.0)

## 2010-08-13 LAB — RAPID URINE DRUG SCREEN, HOSP PERFORMED
Benzodiazepines: NOT DETECTED
Cocaine: NOT DETECTED

## 2010-08-13 LAB — BASIC METABOLIC PANEL
Chloride: 104 mEq/L (ref 96–112)
GFR calc Af Amer: >60 mL/min (ref >60)
GFR calc non Af Amer: >60 mL/min (ref >60)
Potassium: 3.7 mEq/L (ref 3.5–5.1)
Sodium: 142 mEq/L (ref 135–145)

## 2010-08-13 LAB — DIFFERENTIAL
Basophils Absolute: 0 10*3/uL (ref 0.0–0.1)
Basophils Relative: 1 % (ref 0–1)
Eosinophils Absolute: 0.1 10*3/uL (ref 0.0–0.7)
Lymphs Abs: 2.5 10*3/uL (ref 0.7–4.0)
Neutrophils Relative %: 42 % — ABNORMAL LOW (ref 43–77)

## 2010-08-13 LAB — CBC
MCV: 86.7 fL (ref 78.0–100.0)
Platelets: 182 10*3/uL (ref 150–400)
RBC: 4.83 MIL/uL (ref 4.22–5.81)
RDW: 13.8 % (ref 11.5–15.5)
WBC: 5.6 10*3/uL (ref 4.0–10.5)

## 2010-08-13 LAB — ETHANOL: Alcohol, Ethyl (B): <11 mg/dL (ref 0–11)

## 2010-08-17 ENCOUNTER — Emergency Department (HOSPITAL_COMMUNITY)
Admission: EM | Admit: 2010-08-17 | Discharge: 2010-08-17 | Disposition: A | Discharge status: Home / Self Care | Payer: Medicaid Other | Attending: Emergency Medicine | Admitting: Emergency Medicine

## 2010-08-17 DIAGNOSIS — F319 Bipolar disorder, unspecified: Secondary | ICD-10-CM | POA: Insufficient documentation

## 2010-08-17 DIAGNOSIS — Z760 Encounter for issue of repeat prescription: Secondary | ICD-10-CM | POA: Insufficient documentation

## 2010-08-18 ENCOUNTER — Emergency Department (HOSPITAL_COMMUNITY)
Admission: EM | Admit: 2010-08-18 | Discharge: 2010-08-18 | Disposition: A | Discharge status: Home / Self Care | Payer: Medicaid Other | Attending: Emergency Medicine | Admitting: Emergency Medicine

## 2010-08-18 DIAGNOSIS — Z760 Encounter for issue of repeat prescription: Secondary | ICD-10-CM | POA: Insufficient documentation

## 2010-08-18 DIAGNOSIS — F3130 Bipolar disorder, current episode depressed, mild or moderate severity, unspecified: Secondary | ICD-10-CM | POA: Insufficient documentation

## 2010-08-21 ENCOUNTER — Emergency Department (HOSPITAL_COMMUNITY)
Admission: EM | Admit: 2010-08-21 | Discharge: 2010-08-21 | Discharge status: Against Medical Advice | Payer: Medicaid Other | Attending: Emergency Medicine | Admitting: Emergency Medicine

## 2010-08-22 ENCOUNTER — Emergency Department (HOSPITAL_COMMUNITY)
Admission: EM | Admit: 2010-08-22 | Discharge: 2010-08-23 | Disposition: A | Discharge status: Home / Self Care | Payer: Medicaid Other | Attending: Emergency Medicine | Admitting: Emergency Medicine

## 2010-08-22 DIAGNOSIS — F319 Bipolar disorder, unspecified: Secondary | ICD-10-CM | POA: Insufficient documentation

## 2010-08-22 DIAGNOSIS — Z79899 Other long term (current) drug therapy: Secondary | ICD-10-CM | POA: Insufficient documentation

## 2010-08-22 DIAGNOSIS — IMO0001 Reserved for inherently not codable concepts without codable children: Secondary | ICD-10-CM | POA: Insufficient documentation

## 2010-08-22 DIAGNOSIS — F411 Generalized anxiety disorder: Secondary | ICD-10-CM | POA: Insufficient documentation

## 2010-08-22 LAB — CBC
HCT: 41.9 % (ref 39.0–52.0)
MCH: 29.9 pg (ref 26.0–34.0)
MCV: 88.2 fL (ref 78.0–100.0)
Platelets: 170 10*3/uL (ref 150–400)
RBC: 4.75 MIL/uL (ref 4.22–5.81)
WBC: 5.5 10*3/uL (ref 4.0–10.5)

## 2010-08-22 LAB — COMPREHENSIVE METABOLIC PANEL
AST: 22 U/L (ref 0–37)
BUN: 12 mg/dL (ref 6–23)
CO2: 28 mEq/L (ref 19–32)
Calcium: 9.2 mg/dL (ref 8.4–10.5)
Chloride: 106 mEq/L (ref 96–112)
Creatinine, Ser: 0.78 mg/dL (ref 0.50–1.35)
GFR calc Af Amer: >60 mL/min (ref >60)
GFR calc non Af Amer: >60 mL/min (ref >60)
Total Bilirubin: 0.2 mg/dL — ABNORMAL LOW (ref 0.3–1.2)

## 2010-08-22 LAB — DIFFERENTIAL
Eosinophils Absolute: 0.3 10*3/uL (ref 0.0–0.7)
Eosinophils Relative: 5 % (ref 0–5)
Lymphocytes Relative: 37 % (ref 12–46)
Lymphs Abs: 2 10*3/uL (ref 0.7–4.0)
Monocytes Relative: 10 % (ref 3–12)
Neutrophils Relative %: 48 % (ref 43–77)

## 2010-08-22 LAB — ETHANOL: Alcohol, Ethyl (B): <11 mg/dL (ref 0–11)

## 2010-08-22 LAB — RAPID URINE DRUG SCREEN, HOSP PERFORMED
Cocaine: NOT DETECTED
Opiates: NOT DETECTED

## 2010-08-27 ENCOUNTER — Emergency Department (HOSPITAL_COMMUNITY)
Admission: EM | Admit: 2010-08-27 | Discharge: 2010-08-27 | Disposition: A | Discharge status: Home / Self Care | Payer: Medicaid Other | Attending: Emergency Medicine | Admitting: Emergency Medicine

## 2010-08-27 DIAGNOSIS — Z9119 Patient's noncompliance with other medical treatment and regimen: Secondary | ICD-10-CM | POA: Insufficient documentation

## 2010-08-27 DIAGNOSIS — F319 Bipolar disorder, unspecified: Secondary | ICD-10-CM | POA: Insufficient documentation

## 2010-08-27 DIAGNOSIS — Z79899 Other long term (current) drug therapy: Secondary | ICD-10-CM | POA: Insufficient documentation

## 2010-08-27 DIAGNOSIS — Z91199 Patient's noncompliance with other medical treatment and regimen due to unspecified reason: Secondary | ICD-10-CM | POA: Insufficient documentation

## 2010-08-27 LAB — RAPID URINE DRUG SCREEN, HOSP PERFORMED
Amphetamines: NOT DETECTED
Barbiturates: NOT DETECTED
Benzodiazepines: NOT DETECTED
Cocaine: NOT DETECTED
Tetrahydrocannabinol: NOT DETECTED

## 2010-09-02 ENCOUNTER — Emergency Department (HOSPITAL_COMMUNITY)
Admission: EM | Admit: 2010-09-02 | Discharge: 2010-09-03 | Disposition: A | Discharge status: Other Acute Inpatient Hospital | Payer: Medicaid Other | Source: Home / Self Care | Attending: Emergency Medicine | Admitting: Emergency Medicine

## 2010-09-02 DIAGNOSIS — Z046 Encounter for general psychiatric examination, requested by authority: Secondary | ICD-10-CM | POA: Insufficient documentation

## 2010-09-02 DIAGNOSIS — Z5900 Homelessness unspecified: Secondary | ICD-10-CM | POA: Insufficient documentation

## 2010-09-02 DIAGNOSIS — F22 Delusional disorders: Secondary | ICD-10-CM | POA: Insufficient documentation

## 2010-09-02 LAB — RAPID URINE DRUG SCREEN, HOSP PERFORMED
Amphetamines: NOT DETECTED
Barbiturates: NOT DETECTED
Benzodiazepines: NOT DETECTED
Cocaine: NOT DETECTED
Opiates: NOT DETECTED
Tetrahydrocannabinol: NOT DETECTED

## 2010-09-02 LAB — CBC
HCT: 42.2 % (ref 39.0–52.0)
Hemoglobin: 14.2 g/dL (ref 13.0–17.0)
MCH: 29.6 pg (ref 26.0–34.0)
MCHC: 33.6 g/dL (ref 30.0–36.0)
MCV: 87.9 fL (ref 78.0–100.0)
Platelets: 182 K/uL (ref 150–400)
RBC: 4.8 MIL/uL (ref 4.22–5.81)
RDW: 13.8 % (ref 11.5–15.5)
WBC: 4.9 K/uL (ref 4.0–10.5)

## 2010-09-02 LAB — URINALYSIS, ROUTINE W REFLEX MICROSCOPIC
Bilirubin Urine: NEGATIVE
Glucose, UA: NEGATIVE mg/dL
Hgb urine dipstick: NEGATIVE
Ketones, ur: NEGATIVE mg/dL
Leukocytes, UA: NEGATIVE
Nitrite: NEGATIVE
Protein, ur: NEGATIVE mg/dL
Specific Gravity, Urine: 1.02 (ref 1.005–1.030)
Urobilinogen, UA: 1 mg/dL (ref 0.0–1.0)
pH: 6 (ref 5.0–8.0)

## 2010-09-02 LAB — DIFFERENTIAL
Basophils Absolute: 0 10*3/uL (ref 0.0–0.1)
Basophils Relative: 0 % (ref 0–1)
Eosinophils Absolute: 0.1 K/uL (ref 0.0–0.7)
Eosinophils Relative: 2 % (ref 0–5)
Lymphocytes Relative: 38 % (ref 12–46)
Lymphs Abs: 1.9 10*3/uL (ref 0.7–4.0)
Monocytes Absolute: 0.4 10*3/uL (ref 0.1–1.0)
Monocytes Relative: 9 % (ref 3–12)
Neutro Abs: 2.5 10*3/uL (ref 1.7–7.7)
Neutrophils Relative %: 51 % (ref 43–77)

## 2010-09-02 LAB — BASIC METABOLIC PANEL WITH GFR
CO2: 24 meq/L (ref 19–32)
Calcium: 10 mg/dL (ref 8.4–10.5)
Chloride: 107 meq/L (ref 96–112)
Glucose, Bld: 88 mg/dL (ref 70–99)
Sodium: 140 meq/L (ref 135–145)

## 2010-09-02 LAB — BASIC METABOLIC PANEL
BUN: 15 mg/dL (ref 6–23)
Creatinine, Ser: 0.62 mg/dL (ref 0.50–1.35)
GFR calc Af Amer: >60 mL/min (ref >60)
GFR calc non Af Amer: >60 mL/min (ref >60)
Potassium: 3.9 mEq/L (ref 3.5–5.1)

## 2010-09-02 LAB — ETHANOL: Alcohol, Ethyl (B): <11 mg/dL (ref 0–11)

## 2010-09-03 ENCOUNTER — Inpatient Hospital Stay (HOSPITAL_COMMUNITY)
Admission: RE | Admit: 2010-09-03 | Discharge: 2010-09-06 | DRG: 885 | Disposition: A | Discharge status: Home / Self Care | Payer: Medicaid Other | Source: Ambulatory Visit | Attending: Psychiatry | Admitting: Psychiatry

## 2010-09-03 DIAGNOSIS — F39 Unspecified mood [affective] disorder: Secondary | ICD-10-CM

## 2010-09-03 DIAGNOSIS — F200 Paranoid schizophrenia: Secondary | ICD-10-CM

## 2010-09-03 DIAGNOSIS — Z91199 Patient's noncompliance with other medical treatment and regimen due to unspecified reason: Secondary | ICD-10-CM

## 2010-09-03 DIAGNOSIS — Z5900 Homelessness unspecified: Secondary | ICD-10-CM

## 2010-09-03 DIAGNOSIS — Z9119 Patient's noncompliance with other medical treatment and regimen: Secondary | ICD-10-CM

## 2010-09-03 DIAGNOSIS — F29 Unspecified psychosis not due to a substance or known physiological condition: Principal | ICD-10-CM

## 2010-09-04 DIAGNOSIS — F29 Unspecified psychosis not due to a substance or known physiological condition: Secondary | ICD-10-CM

## 2010-09-08 NOTE — Discharge Summary (Signed)
  NAMEBILLYJOE, GO NO.:  [id number]  MEDICAL RECORD NO.:  [id number]  LOCATION:  0402                          FACILITY:  BH  PHYSICIAN:  Eulogio Ditch, MD DATE OF BIRTH:  07-20-1975  DATE OF ADMISSION:  09/03/2010 DATE OF DISCHARGE:  09/06/2010                              DISCHARGE SUMMARY   HISTORY OF PRESENT ILLNESS:  Please refer to initial psych assessment for details.  Briefly, a 35 year old African American male who was admitted on commitment papers.  The patient denied any suicidal ideations or hallucinations at the time of admission.  The patient stated that he was noncompliant with his medications.  HOSPITAL COURSE:  The patient was started on his medication Zyprexa 5 mg p.o. at bedtime.  Side effects of the medications likely irreversible diabetes and other side effects from metabolic syndrome were explained to the patient.  But the patient still wanted to be continued on Zyprexa.  The patient responded to the medication well without any side effects.  On 09/06/2010, the patient was seen in the treatment team.  The patient was logical and goal-directed, not hallucinating or delusional, not suicidal or homicidal.  The patient was motivated to follow up in the outpatient setting and wanted to be compliant with his treatment. Psychoeducation given to the patient regarding abusing alcohol.  When asking why the patient came to the hospital, the patient stated that he came to get some help for the placement.  Also, he was looking for a job.  DIAGNOSES:  AXIS I: Psychosis NOS, rule out schizophrenia paranoid type, rule out mood disorder NOS AXIS II: Deferred AXIS III: No active medical issue AXIS IV: Chronic mental health issues, psychosocial stressors AXIS V: 55  DISCHARGE MEDICATIONS:  Zyprexa 5 mg at bedtime  DISCHARGE FOLLOWUP:  The patient will follow up at [facility].  Appointment was given to the patient.     Eulogio Ditch,  MD     SA/MEDQ  D:  09/06/2010  T:  09/06/2010  Job:  [id number]  Electronically Signed by Eulogio Ditch  on 09/08/2010 02:59:49 PM
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
## 2010-09-09 ENCOUNTER — Emergency Department (HOSPITAL_COMMUNITY)
Admission: EM | Admit: 2010-09-09 | Discharge: 2010-09-09 | Disposition: A | Discharge status: Home / Self Care | Payer: Medicaid Other | Source: Home / Self Care | Attending: Emergency Medicine | Admitting: Emergency Medicine

## 2010-09-09 ENCOUNTER — Inpatient Hospital Stay (HOSPITAL_COMMUNITY)
Admission: AD | Admit: 2010-09-09 | Discharge: 2010-09-14 | DRG: 885 | Disposition: A | Discharge status: Home / Self Care | Payer: Medicaid Other | Source: Ambulatory Visit | Attending: Psychiatry | Admitting: Psychiatry

## 2010-09-09 DIAGNOSIS — Z91199 Patient's noncompliance with other medical treatment and regimen due to unspecified reason: Secondary | ICD-10-CM

## 2010-09-09 DIAGNOSIS — F329 Major depressive disorder, single episode, unspecified: Secondary | ICD-10-CM

## 2010-09-09 DIAGNOSIS — IMO0002 Reserved for concepts with insufficient information to code with codable children: Secondary | ICD-10-CM | POA: Insufficient documentation

## 2010-09-09 DIAGNOSIS — F411 Generalized anxiety disorder: Secondary | ICD-10-CM | POA: Insufficient documentation

## 2010-09-09 DIAGNOSIS — F3289 Other specified depressive episodes: Secondary | ICD-10-CM

## 2010-09-09 DIAGNOSIS — R000 Tachycardia, unspecified: Secondary | ICD-10-CM | POA: Insufficient documentation

## 2010-09-09 DIAGNOSIS — F39 Unspecified mood [affective] disorder: Secondary | ICD-10-CM

## 2010-09-09 DIAGNOSIS — Z9119 Patient's noncompliance with other medical treatment and regimen: Secondary | ICD-10-CM

## 2010-09-09 DIAGNOSIS — F200 Paranoid schizophrenia: Principal | ICD-10-CM

## 2010-09-09 DIAGNOSIS — Z5900 Homelessness unspecified: Secondary | ICD-10-CM

## 2010-09-09 DIAGNOSIS — Z0000 Encounter for general adult medical examination without abnormal findings: Secondary | ICD-10-CM | POA: Insufficient documentation

## 2010-09-13 NOTE — Assessment & Plan Note (Signed)
NAME:  Erik [name], Erik [name] NO.:  [id number]  MEDICAL RECORD NO.:  [id number]  LOCATION:  0400                          FACILITY:  BH  PHYSICIAN:  Eulogio Ditch, MD DATE OF BIRTH:  1975-05-05  DATE OF ADMISSION:  09/09/2010 DATE OF DISCHARGE:                      PSYCHIATRIC ADMISSION ASSESSMENT   DATE OF ASSESSMENT:  September 09, 2010, at 11:40 a.m.  IDENTIFYING INFORMATION:  This is a 35 year old male.  This is a voluntary admission.  He is single.  HISTORY OF THE PRESENT ILLNESS:  Erik [name] presents today with an exacerbation of his psychosis.  He has been having some significant disorganization of thought and reports that he is upset because he did not get his check this month and is unable to find his car.  It takes quite a while to sort out what he is saying.  He is not reporting any specific homicidal or suicidal thoughts, but he does appear somewhat anxious and reports that he is very upset.  He also says that he has nowhere to go, and his living circumstances are unclear.  He was medically cleared in the emergency room yesterday, where he reportedly did smell of alcohol, but he does not appear intoxicated today or with any withdrawal symptoms and has no odor of alcohol.  In the emergency room, he refused basic labs studies.  Today. He has been directable and cooperative, though, as noted, he is disorganized in thought.  He denies any substance abuse when asked directly.  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  He has a history of previous admissions to be [facility] August 31st to September the 3rd and July 12th to the 19th, 2012, also for disorganized thought.  He has requested to remain on Zyprexa. He reports he did not keep any of his followup appointments at [facility].  It is unclear if he has been taking medications.  SOCIAL HISTORY:  Living situation is unknown.  He reports that he  does have a car which he is unable to locate and receives a disability check for chronic mental illness.  He denies any legal charges.  FAMILY HISTORY:  Not available.  MEDICAL HISTORY:  He denies having a primary care provider.  He denies chronic medical problems.  The record reflects no chronic medical problems.  CURRENT MEDICATIONS:  Previously on Zyprexa 5 mg p.o. q.h.s.  DRUG ALLERGIES:  None.  PHYSICAL EXAM:  This is a well-nourished, well-developed male with smooth motor exam and normal gait.  He is fully alert.  Memory seems intact, but thoughts are disorganized.  He will not permit a full physical exam at this time, although he has allowed [place] to get his vital signs.  Temperature 97.9, pulse 103, respirations 20, blood pressure 115/77.  He weighs 80 kg and is 5 feet 10-1/2 inches tall.  He is unable to participate in a review of systems.  Most recent diagnostic studies were done on August the 30th which reflect a normal basic chemistry, urine drug screen negative for all substances, a normal CBC and normal urinalysis.  Alcohol screen at that time was negative.  Updated diagnostic studies are currently pending.  MENTAL STATUS EXAM:  Fully alert male, cooperative.  Appears internally distracted and mildly guarded.  Has several seconds of response latency and appears to be either listening to voices or thinking over my questions before he answers.  He says that he primarily wants me to help him get into college and figure out why he has not gotten his check for the month of September.  Also, says that he does not know what to do about his car.  Thoughts are disorganized and illogical.  Speech is non- pressured.  Affect blunt.  Appears internally distracted.  Insight and judgment are all significantly impaired.  Impulse control appears normal.  Unable to accurately assess memory.  Axis I:  Psychosis not otherwise specified, rule out schizophrenia, acute exacerbation. Axis  II:  No diagnosis. Axis III:  No diagnosis. Axis IV:  Significant issues with burden of illness and apparent economic issues. Axis V:  Current is 30, past year not known.  PLAN:  Voluntarily admit him to our acute stabilization unit.  He has asked for medicine to help with his thoughts and would like to continue his previous Zyprexa 5 mg.  We have no baseline evaluation of his lipids or diabetes screen, so we will get a fasting lipid panel and a hemoglobin A1c.  We have advised him of the risks of Zyprexa in relation to metabolic syndrome, and we will reiterate those instructions after he can process information more effectively.     Margaret A. Lorin Picket, N.P.   ______________________________ Eulogio Ditch, MD    MAS/MEDQ  D:  09/09/2010  T:  09/09/2010  Job:  [id number]  Electronically Signed by Kari Baars N.P. on 09/13/2010 08:43:44 AM Electronically Signed by Eulogio Ditch  on 09/13/2010 01:07:36 PM

## 2010-09-24 ENCOUNTER — Emergency Department (HOSPITAL_COMMUNITY)
Admission: EM | Admit: 2010-09-24 | Discharge: 2010-09-27 | Disposition: A | Discharge status: Home / Self Care | Payer: Medicaid Other | Attending: Emergency Medicine | Admitting: Emergency Medicine

## 2010-09-24 DIAGNOSIS — F3289 Other specified depressive episodes: Secondary | ICD-10-CM | POA: Insufficient documentation

## 2010-09-24 DIAGNOSIS — F329 Major depressive disorder, single episode, unspecified: Secondary | ICD-10-CM | POA: Insufficient documentation

## 2010-09-25 LAB — ETHANOL: Alcohol, Ethyl (B): <11 mg/dL (ref 0–11)

## 2010-09-25 LAB — CBC
Platelets: 201 10*3/uL (ref 150–400)
RDW: 14.3 % (ref 11.5–15.5)
WBC: 6.7 10*3/uL (ref 4.0–10.5)

## 2010-09-25 LAB — DIFFERENTIAL
Basophils Absolute: 0 10*3/uL (ref 0.0–0.1)
Eosinophils Absolute: 0.3 10*3/uL (ref 0.0–0.7)
Eosinophils Relative: 5 % (ref 0–5)

## 2010-09-25 LAB — RAPID URINE DRUG SCREEN, HOSP PERFORMED
Benzodiazepines: NOT DETECTED
Cocaine: NOT DETECTED

## 2010-09-25 LAB — BASIC METABOLIC PANEL
BUN: 11 mg/dL (ref 6–23)
CO2: 30 mEq/L (ref 19–32)
Chloride: 105 mEq/L (ref 96–112)
Creatinine, Ser: 0.83 mg/dL (ref 0.50–1.35)

## 2010-09-28 ENCOUNTER — Emergency Department (HOSPITAL_COMMUNITY)
Admission: EM | Admit: 2010-09-28 | Discharge: 2010-09-28 | Disposition: A | Discharge status: Home / Self Care | Payer: Medicaid Other | Attending: Emergency Medicine | Admitting: Emergency Medicine

## 2010-09-28 DIAGNOSIS — Z79899 Other long term (current) drug therapy: Secondary | ICD-10-CM | POA: Insufficient documentation

## 2010-09-28 DIAGNOSIS — F319 Bipolar disorder, unspecified: Secondary | ICD-10-CM | POA: Insufficient documentation

## 2010-09-28 DIAGNOSIS — Z765 Malingerer [conscious simulation]: Secondary | ICD-10-CM | POA: Insufficient documentation

## 2010-09-28 LAB — CBC
Hemoglobin: 14.4 g/dL (ref 13.0–17.0)
MCHC: 35.3 g/dL (ref 30.0–36.0)
Platelets: 188 10*3/uL (ref 150–400)
RDW: 13.9 % (ref 11.5–15.5)

## 2010-09-28 LAB — DIFFERENTIAL
Basophils Absolute: 0 10*3/uL (ref 0.0–0.1)
Basophils Relative: 0 % (ref 0–1)
Eosinophils Absolute: 0.2 10*3/uL (ref 0.0–0.7)
Monocytes Absolute: 0.7 10*3/uL (ref 0.1–1.0)
Neutro Abs: 4.3 10*3/uL (ref 1.7–7.7)

## 2010-09-28 LAB — COMPREHENSIVE METABOLIC PANEL
ALT: 20 U/L (ref 0–53)
AST: 19 U/L (ref 0–37)
Albumin: 3.5 g/dL (ref 3.5–5.2)
Alkaline Phosphatase: 63 U/L (ref 39–117)
Glucose, Bld: 110 mg/dL — ABNORMAL HIGH (ref 70–99)
Potassium: 3.6 mEq/L (ref 3.5–5.1)
Sodium: 139 mEq/L (ref 135–145)
Total Protein: 6.9 g/dL (ref 6.0–8.3)

## 2010-09-28 LAB — RAPID URINE DRUG SCREEN, HOSP PERFORMED
Amphetamines: NOT DETECTED
Opiates: NOT DETECTED
Tetrahydrocannabinol: NOT DETECTED

## 2010-09-29 NOTE — Discharge Summary (Signed)
NAME:  Erik [name], Erik [name] NO.:  [id number]  MEDICAL RECORD NO.:  [id number]  LOCATION:  0400                          FACILITY:  BH  PHYSICIAN:  Eulogio Ditch, MD DATE OF BIRTH:  1975/03/26  DATE OF ADMISSION:  09/09/2010 DATE OF DISCHARGE:  09/14/2010                              DISCHARGE SUMMARY   IDENTIFYING INFORMATION:  This is a 35 year old, single, African American male.  This is a voluntary admission.  HISTORY OF PRESENT ILLNESS:  Erik [name] presented with an exacerbation of his psychosis.  He had been having significant disorganization of thought and had been upset because he did not get his disability check this month and had been unable to find his car.  On initial presentation, it had taken quite a while to sort out what he was actually saying.  He did not report any specific homicidal or suicidal thoughts but appeared quite anxious and reported that he was very upset.  Also, he said he had nowhere to go and that his living circumstances are unclear.  He apparently smelled of alcohol in the emergency room but did not appear intoxicated on presentation to our unit and denied any withdrawal symptoms.  He refused basic laboratory studies in the emergency room. He has been directable and cooperative though quite disorganized in thought.  He was previously diagnosed with schizophrenia and had a history of previous admissions to [facility] from September 03, 2010, to September 06, 2010, and also in July 2012 for disorganized thinking.  He has been on Zyprexa in the past and typically requests to stay on it.  It is not clear if he had been keeping any of his appointments at [facility].  MEDICAL EVALUATION:  He was medically evaluated in our emergency room. This is a normally-developed African American male with smooth motor exam.  No abnormal movements.  Normal gait.  Memory seemed intact but thoughts disorganized.  Physical exam was unremarkable.   Temperature 97.9, pulse 103, respirations 20, blood pressure 115/77, weight 80 kg, height 5 feet 10-1/2 inches.  He was unable to participate in any meaningful review of systems and had no physical complaints.  Most recent diagnostic studies which had been done previously on September 02, 2010, reflected normal chemistry, negative urine drug screen, normal CBC, normal urinalysis and negative alcohol screen.  COURSE OF HOSPITALIZATION:  He was admitted to our acute stabilization unit and presented as initially cooperative and directable but internally distracted and guarded with several seconds of response latency in responding to questions and appeared to be listening to voices or thinking over questions before giving answers.  His primary request was to have me help him get him into college and figure out why he had not gotten his check for the month of September.  He was also upset about his car, but the reason was unclear.  Thoughts were disorganized and illogical.  Speech was nonpressured.  Affect blunted and appearing internally distracted with significant impairment to insight, judgment and impulse control.  He was given an initial working diagnosis of psychosis, NOS, rule out schizophrenia, paranoid type, acute exacerbation.  We elected to continue his Zyprexa 5 mg nightly.  We  attempted to get basic labs including fasting lipid panel and hemoglobin A1c.  Had advised him of the risks of Zyprexa in relation to metabolic syndrome.  He, however, refused the laboratory studies but was cooperative in taking most doses of the Zyprexa.  Our case worker was able to investigate his outpatient services and found he had an ACTT team working with him.  He is homeless.  His SSDI check had been withheld because of no address.  His ACTT team was working on getting it reinstated for him.  We continued to work with the Zyprexa, and it was gradually titrated to 10 mg in the morning and 10 mg at  night to address symptoms of irritability and ongoing paranoia. Boy was willing to take the night doses but did not want to take a morning dose of medication.  His compliance with the morning dose was variable.  By September 12, 2010, he was doing better and denying any auditory or visual hallucinations but was still concerned about his check.  He had been taking evening medications consistently.  As previously noted, he refused laboratory studies against our advice.  His ACTT team stated that they were familiar with him and were willing to take him home to the shelter and continue to work with him.  He was ready for discharge by September 14, 2010.  DISCHARGE DIAGNOSIS:  AXIS I:  Schizophrenia, paranoid type, acute exacerbation, chronic. AXIS II:  Deferred. AXIS III:  No diagnosis. AXIS IV:  Chronic homelessness. AXIS V:  Current 50, past year not known.  DISCHARGE CONDITION:  Stable.  DISCHARGE PLAN:  Follow up with [facility] team on September 14, 2010, at 11:30 a.m.  They picked him up from the hospital.  DISCHARGE MEDICATIONS:  Olanzapine 10 mg q. a.m. and nightly for clear thoughts.  He was instructed to stop taking the 5 mg olanzapine dose.     Margaret A. Lorin Picket, N.P.   ______________________________ Eulogio Ditch, MD    MAS/MEDQ  D:  09/16/2010  T:  09/16/2010  Job:  [id number]  Electronically Signed by Kari Baars N.P. on 09/17/2010 08:26:20 AM Electronically Signed by Eulogio Ditch  on 09/29/2010 03:01:56 PM

## 2010-09-30 LAB — DIFFERENTIAL
Basophils Absolute: 0.1
Basophils Relative: 1
Eosinophils Absolute: 0.1
Monocytes Absolute: 0.4
Monocytes Relative: 6
Neutro Abs: 3.2
Neutrophils Relative %: 55

## 2010-09-30 LAB — CBC
Hemoglobin: 14.5
MCHC: 33.8
MCV: 89.4
RDW: 14.2

## 2010-09-30 LAB — RAPID URINE DRUG SCREEN, HOSP PERFORMED
Amphetamines: NOT DETECTED
Benzodiazepines: NOT DETECTED
Cocaine: NOT DETECTED
Opiates: NOT DETECTED
Tetrahydrocannabinol: NOT DETECTED

## 2010-09-30 LAB — BASIC METABOLIC PANEL
CO2: 28
Calcium: 9.2
Chloride: 101
Creatinine, Ser: 1.09
Glucose, Bld: 181 — ABNORMAL HIGH

## 2010-09-30 LAB — TRICYCLICS SCREEN, URINE: TCA Scrn: NOT DETECTED

## 2010-10-01 ENCOUNTER — Emergency Department (HOSPITAL_COMMUNITY)
Admission: EM | Admit: 2010-10-01 | Discharge: 2010-10-02 | Disposition: A | Discharge status: Home / Self Care | Payer: Medicaid Other | Attending: Emergency Medicine | Admitting: Emergency Medicine

## 2010-10-01 DIAGNOSIS — R443 Hallucinations, unspecified: Secondary | ICD-10-CM | POA: Insufficient documentation

## 2010-10-01 DIAGNOSIS — F319 Bipolar disorder, unspecified: Secondary | ICD-10-CM | POA: Insufficient documentation

## 2010-10-01 LAB — BASIC METABOLIC PANEL
Chloride: 108
GFR calc Af Amer: >60
GFR calc non Af Amer: >60
Potassium: 3.5
Sodium: 143

## 2010-10-01 LAB — URINALYSIS, ROUTINE W REFLEX MICROSCOPIC
Hgb urine dipstick: NEGATIVE
Nitrite: NEGATIVE
Protein, ur: 100 — AB
Specific Gravity, Urine: 1.039 — ABNORMAL HIGH
Urobilinogen, UA: 1

## 2010-10-01 LAB — ETHANOL: Alcohol, Ethyl (B): <5

## 2010-10-01 LAB — URINE MICROSCOPIC-ADD ON

## 2010-10-01 LAB — CBC
HCT: 43.6
MCV: 89.4
RBC: 4.87
WBC: 5.6

## 2010-10-01 LAB — DIFFERENTIAL
Eosinophils Absolute: 0.2
Eosinophils Relative: 3
Lymphocytes Relative: 43
Lymphs Abs: 2.4
Monocytes Relative: 16 — ABNORMAL HIGH

## 2010-10-01 LAB — RAPID URINE DRUG SCREEN, HOSP PERFORMED
Amphetamines: NOT DETECTED
Barbiturates: NOT DETECTED
Benzodiazepines: NOT DETECTED
Opiates: NOT DETECTED
Tetrahydrocannabinol: NOT DETECTED

## 2010-10-02 LAB — DIFFERENTIAL
Basophils Absolute: 0.1 10*3/uL (ref 0.0–0.1)
Basophils Relative: 1 % (ref 0–1)
Eosinophils Absolute: 0.3 10*3/uL (ref 0.0–0.7)
Eosinophils Relative: 5 % (ref 0–5)
Monocytes Absolute: 0.6 10*3/uL (ref 0.1–1.0)
Monocytes Relative: 10 % (ref 3–12)

## 2010-10-02 LAB — URINALYSIS, ROUTINE W REFLEX MICROSCOPIC
Glucose, UA: NEGATIVE mg/dL
Hgb urine dipstick: NEGATIVE
Leukocytes, UA: NEGATIVE
pH: 7.5 (ref 5.0–8.0)

## 2010-10-02 LAB — CBC
Hemoglobin: 14.5 g/dL (ref 13.0–17.0)
MCH: 30 pg (ref 26.0–34.0)
MCHC: 34.4 g/dL (ref 30.0–36.0)
RDW: 14.4 % (ref 11.5–15.5)

## 2010-10-02 LAB — BASIC METABOLIC PANEL
CO2: 26 mEq/L (ref 19–32)
Calcium: 9.5 mg/dL (ref 8.4–10.5)
Creatinine, Ser: 1.61 mg/dL — ABNORMAL HIGH (ref 0.50–1.35)
GFR calc non Af Amer: 49 mL/min — ABNORMAL LOW (ref >60)

## 2010-10-02 LAB — RAPID URINE DRUG SCREEN, HOSP PERFORMED
Amphetamines: NOT DETECTED
Benzodiazepines: NOT DETECTED
Cocaine: NOT DETECTED
Opiates: NOT DETECTED
Tetrahydrocannabinol: NOT DETECTED

## 2010-10-02 LAB — ETHANOL: Alcohol, Ethyl (B): <11 mg/dL (ref 0–11)

## 2010-10-07 ENCOUNTER — Emergency Department (HOSPITAL_COMMUNITY)
Admission: EM | Admit: 2010-10-07 | Discharge: 2010-10-07 | Discharge status: Against Medical Advice | Payer: Medicaid Other | Attending: Emergency Medicine | Admitting: Emergency Medicine

## 2010-10-07 DIAGNOSIS — F329 Major depressive disorder, single episode, unspecified: Secondary | ICD-10-CM | POA: Insufficient documentation

## 2010-10-07 DIAGNOSIS — F209 Schizophrenia, unspecified: Secondary | ICD-10-CM | POA: Insufficient documentation

## 2010-10-07 DIAGNOSIS — F3289 Other specified depressive episodes: Secondary | ICD-10-CM | POA: Insufficient documentation

## 2010-10-09 ENCOUNTER — Emergency Department (HOSPITAL_COMMUNITY)
Admission: EM | Admit: 2010-10-09 | Discharge: 2010-10-10 | Disposition: A | Discharge status: Other Acute Inpatient Hospital | Payer: Medicaid Other | Attending: Emergency Medicine | Admitting: Emergency Medicine

## 2010-10-09 DIAGNOSIS — F329 Major depressive disorder, single episode, unspecified: Secondary | ICD-10-CM | POA: Insufficient documentation

## 2010-10-09 DIAGNOSIS — F3289 Other specified depressive episodes: Secondary | ICD-10-CM | POA: Insufficient documentation

## 2010-10-09 DIAGNOSIS — R45851 Suicidal ideations: Secondary | ICD-10-CM | POA: Insufficient documentation

## 2010-10-09 DIAGNOSIS — Z8659 Personal history of other mental and behavioral disorders: Secondary | ICD-10-CM | POA: Insufficient documentation

## 2010-10-10 ENCOUNTER — Emergency Department (HOSPITAL_COMMUNITY)
Admission: EM | Admit: 2010-10-10 | Discharge: 2010-10-12 | Disposition: A | Discharge status: Home / Self Care | Payer: Medicaid Other | Attending: Emergency Medicine | Admitting: Emergency Medicine

## 2010-10-10 DIAGNOSIS — F209 Schizophrenia, unspecified: Secondary | ICD-10-CM | POA: Insufficient documentation

## 2010-10-10 DIAGNOSIS — F329 Major depressive disorder, single episode, unspecified: Secondary | ICD-10-CM | POA: Insufficient documentation

## 2010-10-10 DIAGNOSIS — Z79899 Other long term (current) drug therapy: Secondary | ICD-10-CM | POA: Insufficient documentation

## 2010-10-10 DIAGNOSIS — Z5900 Homelessness unspecified: Secondary | ICD-10-CM | POA: Insufficient documentation

## 2010-10-10 DIAGNOSIS — F3289 Other specified depressive episodes: Secondary | ICD-10-CM | POA: Insufficient documentation

## 2010-10-10 LAB — CBC
MCH: 30.2 pg (ref 26.0–34.0)
MCHC: 34.4 g/dL (ref 30.0–36.0)
Platelets: 190 10*3/uL (ref 150–400)
RBC: 4.84 MIL/uL (ref 4.22–5.81)

## 2010-10-10 LAB — DIFFERENTIAL
Basophils Absolute: 0 10*3/uL (ref 0.0–0.1)
Basophils Relative: 1 % (ref 0–1)
Eosinophils Absolute: 0.2 10*3/uL (ref 0.0–0.7)
Monocytes Absolute: 0.7 10*3/uL (ref 0.1–1.0)
Monocytes Relative: 9 % (ref 3–12)
Neutrophils Relative %: 51 % (ref 43–77)

## 2010-10-10 LAB — BASIC METABOLIC PANEL
Calcium: 9.5 mg/dL (ref 8.4–10.5)
Creatinine, Ser: 0.81 mg/dL (ref 0.50–1.35)
GFR calc Af Amer: >90 mL/min (ref >90)

## 2010-10-10 LAB — RAPID URINE DRUG SCREEN, HOSP PERFORMED: Opiates: NOT DETECTED

## 2010-10-10 LAB — ETHANOL: Alcohol, Ethyl (B): <11 mg/dL (ref 0–11)

## 2010-10-16 ENCOUNTER — Emergency Department: Payer: Self-pay | Admitting: Emergency Medicine

## 2010-10-18 ENCOUNTER — Emergency Department (HOSPITAL_COMMUNITY)
Admission: EM | Admit: 2010-10-18 | Discharge: 2010-10-18 | Disposition: A | Discharge status: Home / Self Care | Payer: Medicaid Other | Attending: Emergency Medicine | Admitting: Emergency Medicine

## 2010-10-18 DIAGNOSIS — Z5900 Homelessness unspecified: Secondary | ICD-10-CM | POA: Insufficient documentation

## 2010-10-18 DIAGNOSIS — K2970 Gastritis, unspecified, without bleeding: Secondary | ICD-10-CM | POA: Insufficient documentation

## 2010-10-18 DIAGNOSIS — K2990 Gastroduodenitis, unspecified, without bleeding: Secondary | ICD-10-CM | POA: Insufficient documentation

## 2010-10-18 DIAGNOSIS — R1013 Epigastric pain: Secondary | ICD-10-CM | POA: Insufficient documentation

## 2010-10-22 ENCOUNTER — Emergency Department (HOSPITAL_COMMUNITY)
Admission: EM | Admit: 2010-10-22 | Discharge: 2010-10-22 | Disposition: A | Discharge status: Home / Self Care | Payer: Medicaid Other | Attending: Emergency Medicine | Admitting: Emergency Medicine

## 2010-10-22 DIAGNOSIS — R5381 Other malaise: Secondary | ICD-10-CM | POA: Insufficient documentation

## 2010-10-22 DIAGNOSIS — F209 Schizophrenia, unspecified: Secondary | ICD-10-CM | POA: Insufficient documentation

## 2010-10-22 DIAGNOSIS — F329 Major depressive disorder, single episode, unspecified: Secondary | ICD-10-CM | POA: Insufficient documentation

## 2010-10-22 DIAGNOSIS — F3289 Other specified depressive episodes: Secondary | ICD-10-CM | POA: Insufficient documentation

## 2010-10-24 ENCOUNTER — Emergency Department (HOSPITAL_COMMUNITY)
Admission: EM | Admit: 2010-10-24 | Discharge: 2010-10-24 | Disposition: A | Discharge status: Home / Self Care | Payer: Medicaid Other | Attending: Emergency Medicine | Admitting: Emergency Medicine

## 2010-10-24 DIAGNOSIS — Z0000 Encounter for general adult medical examination without abnormal findings: Secondary | ICD-10-CM | POA: Insufficient documentation

## 2010-10-24 DIAGNOSIS — F329 Major depressive disorder, single episode, unspecified: Secondary | ICD-10-CM | POA: Insufficient documentation

## 2010-10-24 DIAGNOSIS — Z5900 Homelessness unspecified: Secondary | ICD-10-CM | POA: Insufficient documentation

## 2010-10-24 DIAGNOSIS — R52 Pain, unspecified: Secondary | ICD-10-CM | POA: Insufficient documentation

## 2010-10-24 DIAGNOSIS — F3289 Other specified depressive episodes: Secondary | ICD-10-CM | POA: Insufficient documentation

## 2010-10-25 ENCOUNTER — Emergency Department (HOSPITAL_COMMUNITY)
Admission: EM | Admit: 2010-10-25 | Discharge: 2010-10-25 | Disposition: A | Discharge status: Home / Self Care | Payer: Medicaid Other | Attending: Emergency Medicine | Admitting: Emergency Medicine

## 2010-10-25 ENCOUNTER — Ambulatory Visit (HOSPITAL_COMMUNITY)
Admission: RE | Admit: 2010-10-25 | Discharge: 2010-10-25 | Disposition: A | Discharge status: Home / Self Care | Payer: Medicaid Other | Attending: Psychiatry | Admitting: Psychiatry

## 2010-10-25 DIAGNOSIS — F2089 Other schizophrenia: Secondary | ICD-10-CM | POA: Insufficient documentation

## 2010-10-25 DIAGNOSIS — F209 Schizophrenia, unspecified: Secondary | ICD-10-CM | POA: Insufficient documentation

## 2010-10-25 DIAGNOSIS — R5381 Other malaise: Secondary | ICD-10-CM | POA: Insufficient documentation

## 2010-10-25 DIAGNOSIS — Z79899 Other long term (current) drug therapy: Secondary | ICD-10-CM | POA: Insufficient documentation

## 2010-11-13 ENCOUNTER — Encounter: Payer: Self-pay | Admitting: Emergency Medicine

## 2010-11-13 ENCOUNTER — Emergency Department (HOSPITAL_COMMUNITY)
Admission: EM | Admit: 2010-11-13 | Discharge: 2010-11-13 | Disposition: A | Discharge status: Home / Self Care | Payer: Medicaid Other | Attending: Emergency Medicine | Admitting: Emergency Medicine

## 2010-11-13 DIAGNOSIS — F209 Schizophrenia, unspecified: Secondary | ICD-10-CM

## 2010-11-13 DIAGNOSIS — F17200 Nicotine dependence, unspecified, uncomplicated: Secondary | ICD-10-CM | POA: Insufficient documentation

## 2010-11-13 LAB — CBC
HCT: 41.6 % (ref 39.0–52.0)
Hemoglobin: 14.2 g/dL (ref 13.0–17.0)
MCV: 88.5 fL (ref 78.0–100.0)
RBC: 4.7 MIL/uL (ref 4.22–5.81)
WBC: 4.3 10*3/uL (ref 4.0–10.5)

## 2010-11-13 LAB — POCT I-STAT, CHEM 8
BUN: 11 mg/dL (ref 6–23)
Calcium, Ion: 1.24 mmol/L (ref 1.12–1.32)
Chloride: 102 mEq/L (ref 96–112)
Creatinine, Ser: 0.8 mg/dL (ref 0.50–1.35)
Glucose, Bld: 90 mg/dL (ref 70–99)
HCT: 43 % (ref 39.0–52.0)
Hemoglobin: 14.6 g/dL (ref 13.0–17.0)
Sodium: 140 mEq/L (ref 135–145)
TCO2: 27 mmol/L (ref 0–100)

## 2010-11-13 LAB — DIFFERENTIAL
Eosinophils Relative: 3 % (ref 0–5)
Lymphocytes Relative: 38 % (ref 12–46)
Lymphs Abs: 1.6 10*3/uL (ref 0.7–4.0)
Monocytes Absolute: 0.5 10*3/uL (ref 0.1–1.0)

## 2010-11-13 LAB — RAPID URINE DRUG SCREEN, HOSP PERFORMED
Amphetamines: NOT DETECTED
Barbiturates: NOT DETECTED
Benzodiazepines: NOT DETECTED

## 2010-11-13 LAB — ETHANOL: Alcohol, Ethyl (B): <11 mg/dL (ref 0–11)

## 2010-11-13 NOTE — ED Provider Notes (Signed)
History     CSN: [id number] Arrival date & time: 11/13/2010  2:27 AM   First MD Initiated Contact with Patient 11/13/10 [phone]      Chief Complaint  Patient presents with  . Behavior Problem    hearing voices, uneasy feeling    (Consider location/radiation/quality/duration/timing/severity/associated sxs/prior treatment) The history is provided by the patient. The history is limited by the condition of the patient.   patient presents requesting admission for his schizophrenia. States that he recently has restarted his medications which is Zyprexa. He denies any suicidal or homicidal ideations. States that he has not  having increased hallucinations. He denies any recent illicit drug use. Denies any headaches chest pain or vomiting. States that "just doesn't feel right"  Past Medical History  Diagnosis Date  . Schizoaffective disorder     History reviewed. No pertinent past surgical history.  History reviewed. No pertinent family history.  History  Substance Use Topics  . Smoking status: Current Everyday Smoker -- 0.5 packs/day    Types: Cigarettes  . Smokeless tobacco: Not on file  . Alcohol Use: Yes     social      Review of Systems  Unable to perform ROS   Allergies  Review of patient's allergies indicates no known allergies.  Home Medications  No current outpatient prescriptions on file.  BP 112/77  Pulse 87  Temp(Src) 97.8 F (36.6 C) (Oral)  Resp 20  SpO2 100%  Physical Exam  Nursing note and vitals reviewed. Constitutional: He is oriented to person, place, and time. Vital signs are normal. He appears well-developed and well-nourished.  Non-toxic appearance. No distress.  HENT:  Head: Normocephalic and atraumatic.  Eyes: Conjunctivae and EOM are normal. Pupils are equal, round, and reactive to light.  Neck: Normal range of motion. Neck supple. No tracheal deviation present.  Cardiovascular: Normal rate, regular rhythm and normal heart sounds.  Exam  reveals no gallop.   No murmur heard. Pulmonary/Chest: Effort normal and breath sounds normal. No stridor. No respiratory distress. He has no wheezes.  Abdominal: Soft. Normal appearance and bowel sounds are normal. He exhibits no distension. There is no tenderness. There is no rebound.  Musculoskeletal: Normal range of motion. He exhibits no edema and no tenderness.  Neurological: He is alert and oriented to person, place, and time. He has normal strength. No cranial nerve deficit or sensory deficit. GCS eye subscore is 4. GCS verbal subscore is 5. GCS motor subscore is 6.  Skin: Skin is warm and dry.  Psychiatric: His speech is normal. His affect is angry and blunt. He is aggressive. He exhibits a depressed mood. He expresses no homicidal and no suicidal ideation. He expresses no suicidal plans and no homicidal plans.    ED Course  Procedures (including critical care time)   Labs Reviewed  CBC  DIFFERENTIAL  ETHANOL  URINE RAPID DRUG SCREEN (HOSP PERFORMED)  I-STAT, CHEM 8   No results found.   No diagnosis found.    MDM  Patient has been seen by behavioral health services and patient does not have an acute psychiatric emergency. Patient is well-known to the therapist that saw him today. Patient continues to deny suicidal ideations or gross visual or auditory hallucinations. He has been given a followup instructions        Toy Baker, MD 11/13/10 1049

## 2010-11-13 NOTE — ED Notes (Signed)
Bobby, ACT team, is at bedside to assess pt.

## 2010-11-13 NOTE — ED Notes (Signed)
Pt refused to let his vital signs be obtained will notify RN

## 2010-11-13 NOTE — ED Notes (Signed)
Awaiting patient to give urine sample

## 2010-11-13 NOTE — BH Assessment (Signed)
Assessment Note   Erik [name] is an 35 y.o. male. Pt initially came into ED wanting help to "get his life together."  Pt reports he is not taking his meds and does not want to go to [facility].  Pt denies SI/HI and AVH.  Pt uses illegal drugs but does not want help with issue.  Pt appears paranoid but at his base line.  Pt is frequently at [facility] and staff is aware of pt issues.  Pt currently receives ACTT services from [facility].  Pt referred back to program as well as to [facility] for optx and med mgt.  Pt reported he did not want help at this time.  While pt does struggle with schizophrenia pt appears to be able to make decisions, Ox3 and does not appear to be a threat to self or others at this time.  Pt encouraged to return to [facility] or ED if pt needed further help over the weekend.  Pt reported he wanted to leave and would follow up with services recommended on Monday "if I so choose."    Axis I: Schizoaffective Disorder  Past Medical History:  Past Medical History  Diagnosis Date  . Schizoaffective disorder     History reviewed. No pertinent past surgical history.  Family History: History reviewed. No pertinent family history.  Social History:  reports that he has been smoking Cigarettes.  He has been smoking about .5 packs per day. He does not have any smokeless tobacco history on file. He reports that he drinks alcohol. He reports that he does not use illicit drugs.  Allergies: No Known Allergies  Home Medications:  No current facility-administered medications on file as of 11/13/2010.   No current outpatient prescriptions on file as of 11/13/2010.    OB/GYN Status:  No LMP for male patient.     Risk to self Is patient at risk for suicide?: No Substance abuse history and/or treatment for substance abuse?: No     Mental Status Report Motor Activity:  (resting)           Home Assistive Devices/Equipment Home Assistive Devices/Equipment: None      Abuse/Neglect Assessment (Assessment to be complete while patient is alone) Physical Abuse: Denies Verbal Abuse: Denies Sexual Abuse: Denies Exploitation of patient/patient's resources: Denies Self-Neglect: Denies Values / Beliefs Cultural Requests During Hospitalization: None Spiritual Requests During Hospitalization: None Consults Spiritual Care Consult Needed: No Social Work Consult Needed: No            Disposition:    Pt discharged to home with follow up to [facility] and [facility] where client is currently involved.  Dr. Freida Busman in agreement with plan as is patient and ACT Team.  On Site Evaluation by:   Reviewed with Physician:     Titus Mould, Eppie Gibson 11/13/2010 11:10 AM

## 2010-11-13 NOTE — ED Notes (Signed)
Pt states he is having psych problems  Pt states he has been off his meds for about a month now

## 2010-11-13 NOTE — ED Notes (Signed)
Went with nurse to collect labs patient refused stated that he gave blood yesterday and was not giving any today.  Nurse went to talk to MD

## 2010-11-25 ENCOUNTER — Encounter (HOSPITAL_COMMUNITY): Payer: Self-pay | Admitting: *Deleted

## 2010-11-25 ENCOUNTER — Emergency Department (HOSPITAL_COMMUNITY)
Admission: EM | Admit: 2010-11-25 | Discharge: 2010-11-26 | Disposition: A | Discharge status: Home / Self Care | Payer: Medicaid Other | Attending: Emergency Medicine | Admitting: Emergency Medicine

## 2010-11-25 DIAGNOSIS — F17200 Nicotine dependence, unspecified, uncomplicated: Secondary | ICD-10-CM | POA: Insufficient documentation

## 2010-11-25 DIAGNOSIS — F259 Schizoaffective disorder, unspecified: Secondary | ICD-10-CM | POA: Insufficient documentation

## 2010-11-25 DIAGNOSIS — M791 Myalgia, unspecified site: Secondary | ICD-10-CM

## 2010-11-25 DIAGNOSIS — F411 Generalized anxiety disorder: Secondary | ICD-10-CM | POA: Insufficient documentation

## 2010-11-25 DIAGNOSIS — Z79899 Other long term (current) drug therapy: Secondary | ICD-10-CM | POA: Insufficient documentation

## 2010-11-25 DIAGNOSIS — IMO0001 Reserved for inherently not codable concepts without codable children: Secondary | ICD-10-CM | POA: Insufficient documentation

## 2010-11-25 DIAGNOSIS — F419 Anxiety disorder, unspecified: Secondary | ICD-10-CM

## 2010-11-25 MED ORDER — LORAZEPAM 1 MG PO TABS
1.0000 mg | ORAL_TABLET | Freq: Once | ORAL | Status: AC
  Administered 2010-11-26: 1 mg via ORAL
  Filled 2010-11-25: qty 1

## 2010-11-25 MED ORDER — BENZTROPINE MESYLATE 1 MG PO TABS: 1.0000 mg | ORAL_TABLET | Freq: Once | ORAL | Status: DC

## 2010-11-25 MED ORDER — BENZTROPINE MESYLATE 1 MG PO TABS
2.0000 mg | ORAL_TABLET | Freq: Once | ORAL | Status: AC
  Administered 2010-11-26: 2 mg via ORAL
  Filled 2010-11-25: qty 2

## 2010-11-25 NOTE — ED Provider Notes (Signed)
History     CSN: [id number] Arrival date & time: 11/25/2010  8:28 PM   First MD Initiated Contact with Patient 11/25/10 2230      Chief Complaint  Patient presents with  . Generalized Body Aches    HPI  Hx provided by pt.  Pt presents with complaints of not taking his meds for the past 2 days and feeling anxious with some general body aches.  Symptoms came on gradually.  Pt denies any aggravating or aleviating factors.  Pt denies fever, chills, sweats, cough, sore throat, abdominal pain, diarrhea, constipation.  Pt denies depression, hallucinations, SI/HI. Pt is not requesting help with a psychiatrist and states that he has a follow up appointment with Act team next week.      Past Medical History  Diagnosis Date  . Schizoaffective disorder     History reviewed. No pertinent past surgical history.  History reviewed. No pertinent family history.  History  Substance Use Topics  . Smoking status: Current Everyday Smoker -- 0.5 packs/day    Types: Cigarettes  . Smokeless tobacco: Not on file  . Alcohol Use: Yes     social      Review of Systems  Constitutional: Negative for fever, chills and fatigue.  HENT: Negative for congestion, sore throat and rhinorrhea.   Respiratory: Negative for cough and shortness of breath.   Cardiovascular: Negative for chest pain.  Gastrointestinal: Negative for nausea, vomiting, abdominal pain, diarrhea and constipation.  Psychiatric/Behavioral: Negative for suicidal ideas, hallucinations, confusion and self-injury.  All other systems reviewed and are negative.    Allergies  Review of patient's allergies indicates no known allergies.  Home Medications   Current Outpatient Rx  Name Route Sig Dispense Refill  . BENZTROPINE MESYLATE 2 MG PO TABS Oral Take 2 mg by mouth daily.      [name] DIVALPROEX SODIUM 500 MG PO TBEC Oral Take 500 mg by mouth 3 (three) times daily.      [name] OLANZAPINE 20 MG PO TABS Oral Take by mouth at bedtime.         BP 109/73  Pulse 87  Temp(Src) 98.3 F (36.8 C) (Oral)  Resp 20  SpO2 100%  Physical Exam  Nursing note and vitals reviewed. Constitutional: He is oriented to person, place, and time. He appears well-developed and well-nourished. No distress.  HENT:  Head: Normocephalic.  Eyes: Conjunctivae and EOM are normal. Pupils are equal, round, and reactive to light.  Cardiovascular: Normal rate.   No murmur heard. Pulmonary/Chest: Effort normal. He has no wheezes. He has no rales.  Abdominal: Soft. There is no tenderness. There is no rebound and no guarding.  Musculoskeletal: Normal range of motion.  Neurological: He is alert and oriented to person, place, and time.  Skin: Skin is warm.  Psychiatric: He has a normal mood and affect. Judgment normal.    ED Course  Procedures (including critical care time)    1. Myalgia   2. Anxiety      MDM  Patient seen and evaluated. Patient with no acute distress         Angus Seller, [place] 11/26/10 [phone]

## 2010-11-25 NOTE — ED Notes (Signed)
the patient is actively talking to himself mumbling incoherent words. When asked a question the patient has to stop mumbling to himself and then answer the question. the patient sometimes takes 2 to 3 seconds to answer questions. the patient appears to be mildly agitated.

## 2010-11-25 NOTE — ED Notes (Addendum)
the patient states he wants medicine to calm his nerves. the patient also states he has been out of his medication for four to five months

## 2010-11-25 NOTE — ED Notes (Signed)
Pt in c/o generalized body aches x1 day

## 2010-11-26 NOTE — ED Provider Notes (Signed)
Medical screening examination/treatment/procedure(s) were performed by non-physician practitioner and as supervising physician I was immediately available for consultation/collaboration.  Karsen Fellows K Capone Schwinn-Rasch, MD 11/26/10 1915 

## 2010-12-01 ENCOUNTER — Encounter (HOSPITAL_COMMUNITY): Payer: Self-pay | Admitting: Emergency Medicine

## 2010-12-01 ENCOUNTER — Emergency Department (HOSPITAL_COMMUNITY)
Admission: EM | Admit: 2010-12-01 | Discharge: 2010-12-02 | Disposition: A | Discharge status: Home / Self Care | Payer: Medicaid Other | Attending: Emergency Medicine | Admitting: Emergency Medicine

## 2010-12-01 DIAGNOSIS — Z9119 Patient's noncompliance with other medical treatment and regimen: Secondary | ICD-10-CM | POA: Insufficient documentation

## 2010-12-01 DIAGNOSIS — Z91199 Patient's noncompliance with other medical treatment and regimen due to unspecified reason: Secondary | ICD-10-CM | POA: Insufficient documentation

## 2010-12-01 DIAGNOSIS — F259 Schizoaffective disorder, unspecified: Secondary | ICD-10-CM | POA: Insufficient documentation

## 2010-12-01 DIAGNOSIS — Z9114 Patient's other noncompliance with medication regimen: Secondary | ICD-10-CM

## 2010-12-01 DIAGNOSIS — F17200 Nicotine dependence, unspecified, uncomplicated: Secondary | ICD-10-CM | POA: Insufficient documentation

## 2010-12-01 NOTE — ED Notes (Signed)
C/o auditory hallucinations x 1 month.  Pt denies suicidal ideation.  States he just needs some "common ground".

## 2010-12-02 MED ORDER — BENZTROPINE MESYLATE 1 MG PO TABS: 1.0000 mg | ORAL_TABLET | Freq: Two times a day (BID) | ORAL | Status: DC

## 2010-12-02 MED ORDER — DIVALPROEX SODIUM ER 500 MG PO TB24: 500.0000 mg | ORAL_TABLET | Freq: Every day | ORAL | Status: DC

## 2010-12-02 MED ORDER — OLANZAPINE 10 MG PO TABS: 10.0000 mg | ORAL_TABLET | Freq: Every day | ORAL | Status: DC

## 2010-12-02 MED ORDER — OLANZAPINE 10 MG PO TABS
10.0000 mg | ORAL_TABLET | ORAL | Status: AC
  Administered 2010-12-02: 10 mg via ORAL
  Filled 2010-12-02: qty 1

## 2010-12-02 MED ORDER — BENZTROPINE MESYLATE 1 MG/ML IJ SOLN
1.0000 mg | INTRAMUSCULAR | Status: DC
  Filled 2010-12-02: qty 1

## 2010-12-02 NOTE — ED Provider Notes (Signed)
History     CSN: [id number] Arrival date & time: 12/01/2010 11:42 PM   First MD Initiated Contact with Patient 12/02/10 0043      Chief Complaint  Patient presents with  . Hallucinations    (Consider location/radiation/quality/duration/timing/severity/associated sxs/prior treatment) The history is provided by the patient.   the patient reports auditory hallucinations for one month.  He denies suicidal or homicidal ideations.  He reports his post be on Zyprexa as well as Cogentin however he has been noncompliant with these.  He is unable to say why he is noncompliant with these.  He reports no command hallucinations.  Nothing worsens the symptoms.  Nothing improves his symptoms.  His symptoms are mild.  He has no homicidal ideation.  He denies drug abuse to me  Past Medical History  Diagnosis Date  . Schizoaffective disorder     History reviewed. No pertinent past surgical history.  No family history on file.  History  Substance Use Topics  . Smoking status: Current Everyday Smoker -- 0.5 packs/day    Types: Cigarettes  . Smokeless tobacco: Not on file  . Alcohol Use: Yes     social      Review of Systems  All other systems reviewed and are negative.    Allergies  Review of patient's allergies indicates no known allergies.  Home Medications   Current Outpatient Rx  Name Route Sig Dispense Refill  . BENZTROPINE MESYLATE 2 MG PO TABS Oral Take 2 mg by mouth daily.      [name] DIVALPROEX SODIUM 500 MG PO TBEC Oral Take 500 mg by mouth 3 (three) times daily.      [name] OLANZAPINE 20 MG PO TABS Oral Take by mouth at bedtime.        BP 119/77  Pulse 85  Temp(Src) 96.8 F (36 C) (Oral)  Resp 18  SpO2 99%  Physical Exam  Nursing note and vitals reviewed. Constitutional: He is oriented to person, place, and time. He appears well-developed and well-nourished.  HENT:  Head: Normocephalic and atraumatic.  Eyes: EOM are normal.  Neck: Normal range of motion.    Cardiovascular: Normal rate, regular rhythm, normal heart sounds and intact distal pulses.   Pulmonary/Chest: Effort normal and breath sounds normal. No respiratory distress.  Abdominal: Soft. He exhibits no distension. There is no tenderness.  Musculoskeletal: Normal range of motion.  Neurological: He is alert and oriented to person, place, and time.  Skin: Skin is warm and dry.  Psychiatric: He has a normal mood and affect.       Clear coherent speech    ED Course  Procedures (including critical care time)   Labs Reviewed  CBC  DIFFERENTIAL  I-STAT, CHEM 8  ETHANOL  URINE RAPID DRUG SCREEN (HOSP PERFORMED)   No results found.   1. Schizoaffective disorder   2. Noncompliance with medications       MDM  This appears to be secondary to medical noncompliance.  He'll start his Zyprexa in the emergency department.  At this time the patient does not appear to be a threat to himself or to others.  He has no homicidal or suicidal ideation.  I can help the patient obtain his medications his symptoms should improve  7:13 AM The patient was able to sleep.  He feels much better at this time.  He's been given Zyprexa.  He reports significant improvement in his symptoms.  Patient also reports his post be on Depakote.  Hour and prescriptions for  his Depakote as well as Zyprexa and Cogentin.  He does request a prescription for Ativan as well for which I recommended he followup with his doctor for      Lyanne Co, MD 12/02/10 [phone]

## 2010-12-02 NOTE — ED Notes (Signed)
Patient verbalized understanding of need to get prescriptions filled. Pt provided with bus passes in order to go get rx filled this morning. Pt ate breakfast. He had no questions or complaints at discharge.

## 2010-12-30 ENCOUNTER — Encounter (HOSPITAL_COMMUNITY): Payer: Self-pay | Admitting: Emergency Medicine

## 2010-12-30 ENCOUNTER — Emergency Department (HOSPITAL_COMMUNITY)
Admission: EM | Admit: 2010-12-30 | Discharge: 2010-12-31 | Disposition: A | Discharge status: Home / Self Care | Payer: Medicaid Other | Attending: Emergency Medicine | Admitting: Emergency Medicine

## 2010-12-30 ENCOUNTER — Ambulatory Visit (HOSPITAL_COMMUNITY): Admission: RE | Admit: 2010-12-30 | Payer: Medicaid Other | Source: Home / Self Care | Admitting: Psychiatry

## 2010-12-30 ENCOUNTER — Ambulatory Visit (HOSPITAL_COMMUNITY)
Admission: RE | Admit: 2010-12-30 | Discharge: 2010-12-30 | Disposition: A | Discharge status: Home / Self Care | Payer: Medicaid Other | Attending: Psychiatry | Admitting: Psychiatry

## 2010-12-30 DIAGNOSIS — IMO0002 Reserved for concepts with insufficient information to code with codable children: Secondary | ICD-10-CM | POA: Insufficient documentation

## 2010-12-30 DIAGNOSIS — F17200 Nicotine dependence, unspecified, uncomplicated: Secondary | ICD-10-CM | POA: Insufficient documentation

## 2010-12-30 DIAGNOSIS — F3289 Other specified depressive episodes: Secondary | ICD-10-CM | POA: Insufficient documentation

## 2010-12-30 DIAGNOSIS — Z79899 Other long term (current) drug therapy: Secondary | ICD-10-CM | POA: Insufficient documentation

## 2010-12-30 DIAGNOSIS — F329 Major depressive disorder, single episode, unspecified: Secondary | ICD-10-CM | POA: Insufficient documentation

## 2010-12-30 HISTORY — DX: Major depressive disorder, single episode, unspecified: F32.9

## 2010-12-30 HISTORY — DX: Depression, unspecified: F32.A

## 2010-12-30 LAB — CBC
Hemoglobin: 16 g/dL (ref 13.0–17.0)
MCH: 30.3 pg (ref 26.0–34.0)
MCHC: 34.9 g/dL (ref 30.0–36.0)
RDW: 14.3 % (ref 11.5–15.5)

## 2010-12-30 LAB — COMPREHENSIVE METABOLIC PANEL
ALT: 21 U/L (ref 0–53)
Albumin: 4 g/dL (ref 3.5–5.2)
Alkaline Phosphatase: 67 U/L (ref 39–117)
Calcium: 10 mg/dL (ref 8.4–10.5)
GFR calc Af Amer: >90 mL/min (ref >90)
Glucose, Bld: 133 mg/dL — ABNORMAL HIGH (ref 70–99)
Potassium: 3.8 mEq/L (ref 3.5–5.1)
Sodium: 139 mEq/L (ref 135–145)
Total Protein: 7.8 g/dL (ref 6.0–8.3)

## 2010-12-30 LAB — ETHANOL: Alcohol, Ethyl (B): <11 mg/dL (ref 0–11)

## 2010-12-30 NOTE — ED Notes (Signed)
Pt alert, presents with c/o SI, onset was a month ago, resp even unlabored, skin pwd, states recent family stressors

## 2010-12-31 ENCOUNTER — Encounter (HOSPITAL_COMMUNITY): Payer: Self-pay | Admitting: *Deleted

## 2010-12-31 LAB — RAPID URINE DRUG SCREEN, HOSP PERFORMED
Amphetamines: NOT DETECTED
Barbiturates: NOT DETECTED
Benzodiazepines: NOT DETECTED
Cocaine: NOT DETECTED

## 2010-12-31 NOTE — BH Assessment (Signed)
Assessment Note   Erik [name] is a 35 y.o. male who presents to [facility] with SI.  Pt reports he is current homeless after being asked to leave the homeless shelter.  Pt has been off meds x2 days and states someone is after him and is not able to provide specifics--"I can't explain it".  Pt is agitated and unwilling to speak with this counselor.  Pt walked away in the middle of the assessment and then shut the door in this counselor's face.  Pt told med staff that he was tired and cold and wanted to rest.  Pt pulled covers over head during assessment and he's refusing proper care from med staff only req food which he has been provided throughout the night.  Telepsych requested and completed, final disposition from Dr. Ma Hillock, ok to discharge.  Pt will be d/c'd home and referred back to current provider.    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 Schizoaffective disorder   . Depression    Axis IV: economic problems, housing problems, other psychosocial or environmental problems, problems related to social environment and problems with primary support group Axis V: 51-60 moderate symptoms  Past Medical History:  Past Medical History  Diagnosis Date  . Schizoaffective disorder   . Depression     History reviewed. No pertinent past surgical history.  Family History: No family history on file.  Social History:  reports that he has been smoking Cigarettes.  He has been smoking about .5 packs per day. He does not have any smokeless tobacco history on file. He reports that he drinks alcohol. He reports that he does not use illicit drugs.  Additional Social History:  Alcohol / Drug Use Pain Medications: None  Prescriptions: None  Over the Counter: None  History of alcohol / drug use?: Yes (Pt. has past hx; no current use; UDS is negative ) Longest period of sobriety (when/how long): None  Allergies: No Known Allergies  Home  Medications:  No current facility-administered medications on file as of 12/30/2010.   Medications Prior to Admission  Medication Sig Dispense Refill  . divalproex (DEPAKOTE ER) 500 MG 24 hr tablet Take 1 tablet (500 mg total) by mouth daily.  30 tablet  0  . OLANZapine (ZYPREXA) 10 MG tablet Take 1 tablet (10 mg total) by mouth at bedtime.  30 tablet  0    OB/GYN Status:  No LMP for male patient.  General Assessment Data Location of Assessment: [facility] Living Arrangements: Homeless Can pt return to current living arrangement?: Yes Admission Status: Voluntary Is patient capable of signing voluntary admission?: Yes Transfer from: Acute Hospital Referral Source: MD  Education Status Is patient currently in school?: No Current Grade: None  Highest grade of school patient has completed: Unk  Name of school: None  Contact person: None   Risk to self Suicidal Ideation: No Suicidal Intent: No Is patient at risk for suicide?: No Suicidal Plan?: No Access to Means: No What has been your use of drugs/alcohol within the last 12 months?: None  Previous Attempts/Gestures: No How many times?: 0  Other Self Harm Risks: None  Triggers for Past Attempts: None known Intentional Self Injurious Behavior: None Family Suicide History: No Recent stressful life event(s): Other (Comment) (Pt. is homeless; off meds x2 days ) Persecutory voices/beliefs?: No Depression: Yes Depression Symptoms: Loss of interest in usual pleasures Substance abuse history and/or treatment for substance abuse?:  No Suicide prevention information given to non-admitted patients: Not applicable  Risk to Others Homicidal Ideation: No Thoughts of Harm to Others: No Current Homicidal Intent: No Current Homicidal Plan: No Access to Homicidal Means: No Identified Victim: None  History of harm to others?: No Assessment of Violence: None Noted Violent Behavior Description: None  Does patient have access to weapons?:  No Criminal Charges Pending?: No Does patient have a court date: No  Psychosis Hallucinations: None noted Delusions: None noted  Mental Status Report Appear/Hygiene: Body odor;Disheveled Eye Contact: Poor Motor Activity: Agitation;Restlessness Speech: Incoherent Level of Consciousness: Drowsy Mood: Anhedonia;Depressed Affect: Depressed Anxiety Level: None Thought Processes: Irrelevant Judgement: Unimpaired Orientation: Person;Place;Time;Situation Obsessive Compulsive Thoughts/Behaviors: None  Cognitive Functioning Concentration: Normal Memory: Recent Intact;Remote Intact IQ: Average Insight: Fair Impulse Control: Fair Appetite: Good Weight Loss: 0  Weight Gain: 0  Sleep: No Change Total Hours of Sleep: 8  Vegetative Symptoms: Not bathing Vegetative Symptoms: Not bathing  Prior Inpatient Therapy Prior Inpatient Therapy: Yes Prior Therapy Dates: 2009-2012 Prior Therapy Facilty/Provider(s): [facility]  Reason for Treatment: Depression/SI  Prior Outpatient Therapy Prior Outpatient Therapy: No Prior Therapy Dates: None  Prior Therapy Facilty/Provider(s): None  Reason for Treatment: None   ADL Screening (condition at time of admission) Patient's cognitive ability adequate to safely complete daily activities?: Yes Patient able to express need for assistance with ADLs?: Yes Independently performs ADLs?: Yes Weakness of Legs: None Weakness of Arms/Hands: None       Abuse/Neglect Assessment (Assessment to be complete while patient is alone) Physical Abuse: Denies Verbal Abuse: Denies Sexual Abuse: Denies Exploitation of patient/patient's resources: Denies Self-Neglect: Denies Values / Beliefs Cultural Requests During Hospitalization: None Spiritual Requests During Hospitalization: None Consults Spiritual Care Consult Needed: No Social Work Consult Needed: No Merchant navy officer (For Healthcare) Advance Directive: Patient does not have advance directive;Patient  would not like information Pre-existing out of facility DNR order (yellow form or pink MOST form): No    Additional Information 1:1 In Past 12 Months?: No CIRT Risk: No Elopement Risk: No Does patient have medical clearance?: Yes     Disposition:  Disposition Disposition of Patient: Inpatient treatment program;Referred to (Telepsych for disposition ) Type of inpatient treatment program:  (Unk at this time ) Type of treatment offered and refused: Out-patient Patient referred to: [facility];Outpatient clinic referral (Final disposition to be determined by telepsych )  On Site Evaluation by:   Reviewed with Physician:     Murrell Redden 12/31/2010 5:01 AM

## 2010-12-31 NOTE — ED Notes (Signed)
Pt with offensive body odor upon admission. Given shower supplies by staff, but pt refuses to shower at this time, stating he is too tired and cold. Pt instructed to shower in AM before breakfast.

## 2010-12-31 NOTE — ED Notes (Signed)
When staff entered room to obtain VS, pt had pushed chair against door in effort to block it. Writer explained to pt that he was being discharged per recommendation of telepsych MD and pt became upset, stating that he "can't be discharged because I didn't get my check yet",and "can't discharge me without my meds". RN explained to pt that he was already aware of the resources that were available to him, and reminded him that he could get his meds through [facility], as he usually did. He was also reminded that he had been asked to leave the last shelter he was in for refusing to comply with rules, such as bathing, and that if he were compliant with the rules, he could stay at one of the shelters, most of which open at 0800. Toiletries given to pt to bathe before he leaves, but he refused. As he is escorted out, pt states "I will just be out here for a little while, and then come back," referring to the lobby. [employer] escorts pt out of building, and reminded him of loitering rule.

## 2010-12-31 NOTE — ED Notes (Signed)
ACT counselor attempted to assess pt, but he stated he did not want 'to be bothered'. Got out of bed and shut the door on her.

## 2011-01-04 ENCOUNTER — Encounter (HOSPITAL_COMMUNITY): Payer: Self-pay

## 2011-01-04 ENCOUNTER — Emergency Department (HOSPITAL_COMMUNITY)
Admission: EM | Admit: 2011-01-04 | Discharge: 2011-01-04 | Disposition: A | Discharge status: Home / Self Care | Payer: Medicaid Other | Attending: Emergency Medicine | Admitting: Emergency Medicine

## 2011-01-04 DIAGNOSIS — F329 Major depressive disorder, single episode, unspecified: Secondary | ICD-10-CM | POA: Insufficient documentation

## 2011-01-04 DIAGNOSIS — F17200 Nicotine dependence, unspecified, uncomplicated: Secondary | ICD-10-CM | POA: Insufficient documentation

## 2011-01-04 DIAGNOSIS — F3289 Other specified depressive episodes: Secondary | ICD-10-CM | POA: Insufficient documentation

## 2011-01-04 DIAGNOSIS — R45851 Suicidal ideations: Secondary | ICD-10-CM

## 2011-01-04 HISTORY — DX: Suicidal ideations: R45.851

## 2011-01-04 LAB — COMPREHENSIVE METABOLIC PANEL
BUN: 11 mg/dL (ref 6–23)
CO2: 28 mEq/L (ref 19–32)
Calcium: 9.3 mg/dL (ref 8.4–10.5)
Chloride: 100 mEq/L (ref 96–112)
Creatinine, Ser: 0.71 mg/dL (ref 0.50–1.35)
GFR calc non Af Amer: >90 mL/min (ref >90)
Total Bilirubin: 0.3 mg/dL (ref 0.3–1.2)

## 2011-01-04 LAB — URINALYSIS, ROUTINE W REFLEX MICROSCOPIC
Leukocytes, UA: NEGATIVE
Nitrite: NEGATIVE
Specific Gravity, Urine: 1.021 (ref 1.005–1.030)
pH: 5.5 (ref 5.0–8.0)

## 2011-01-04 LAB — RAPID URINE DRUG SCREEN, HOSP PERFORMED
Benzodiazepines: NOT DETECTED
Cocaine: NOT DETECTED
Opiates: NOT DETECTED

## 2011-01-04 LAB — CBC
Hemoglobin: 15.1 g/dL (ref 13.0–17.0)
Platelets: 171 10*3/uL (ref 150–400)
RBC: 5.05 MIL/uL (ref 4.22–5.81)
WBC: 8 10*3/uL (ref 4.0–10.5)

## 2011-01-04 MED ORDER — LORAZEPAM 1 MG PO TABS: 1.0000 mg | ORAL_TABLET | Freq: Three times a day (TID) | ORAL | Status: DC | PRN

## 2011-01-04 MED ORDER — ALUM & MAG HYDROXIDE-SIMETH 200-200-20 MG/5ML PO SUSP: 30.0000 mL | ORAL | Status: DC | PRN

## 2011-01-04 MED ORDER — IBUPROFEN 200 MG PO TABS: 600.0000 mg | ORAL_TABLET | Freq: Three times a day (TID) | ORAL | Status: DC | PRN

## 2011-01-04 MED ORDER — ONDANSETRON HCL 4 MG PO TABS: 4.0000 mg | ORAL_TABLET | Freq: Three times a day (TID) | ORAL | Status: DC | PRN

## 2011-01-04 MED ORDER — NICOTINE 21 MG/24HR TD PT24: 21.0000 mg | MEDICATED_PATCH | Freq: Every day | TRANSDERMAL | Status: DC

## 2011-01-04 NOTE — ED Notes (Signed)
telepsych center called and paperwork faxed. Awaiting call to set up conference call.

## 2011-01-04 NOTE — ED Notes (Signed)
telepsych conference in progress.

## 2011-01-04 NOTE — ED Notes (Signed)
Pt admits to SI/HI- no plan just thoughts

## 2011-01-04 NOTE — ED Provider Notes (Signed)
History     CSN: [id number]  Arrival date & time 01/04/11  0014   First MD Initiated Contact with Patient 01/04/11 0123      Chief Complaint  Patient presents with  . Medical Clearance  . Suicidal    (Consider location/radiation/quality/duration/timing/severity/associated sxs/prior treatment) HPI Comments: Patient presents stating that he is having suicidal thoughts regarding stress he is having with his family. Patient denies plan. Patient denies medical complaints other than his depression is making his body "feel sick".  Patient is a 36 y.o. male presenting with mental health disorder. The history is provided by the patient.  Mental Health Problem  He admits to suicidal ideas. He does not have a plan to commit suicide.    Past Medical History  Diagnosis Date  . Schizoaffective disorder   . Depression   . Suicidal thoughts     History reviewed. No pertinent past surgical history.  No family history on file.  History  Substance Use Topics  . Smoking status: Current Everyday Smoker -- 0.5 packs/day    Types: Cigarettes  . Smokeless tobacco: Not on file  . Alcohol Use: Yes     social      Review of Systems  Unable to perform ROS: Psychiatric disorder    Allergies  Review of patient's allergies indicates no known allergies.  Home Medications   Current Outpatient Rx  Name Route Sig Dispense Refill  . DIVALPROEX SODIUM ER 500 MG PO TB24 Oral Take 1 tablet (500 mg total) by mouth daily. 30 tablet 0    BP 112/70  Pulse 77  Temp(Src) 97.6 F (36.4 C) (Oral)  Resp 16  Wt 209 lb (94.802 kg)  SpO2 99%  Physical Exam  Nursing note and vitals reviewed. Constitutional: He is oriented to person, place, and time. He appears well-developed and well-nourished.  HENT:  Head: Normocephalic and atraumatic.  Eyes: Conjunctivae are normal. Pupils are equal, round, and reactive to light.  Neck: Normal range of motion. Neck supple.  Cardiovascular: Normal rate,  regular rhythm and normal heart sounds.   Pulmonary/Chest: Effort normal and breath sounds normal.  Abdominal: Soft. Bowel sounds are normal. There is no tenderness. There is no rebound and no guarding.  Musculoskeletal: He exhibits no edema.  Neurological: He is alert and oriented to person, place, and time.  Skin: Skin is warm and dry.  Psychiatric: He exhibits a depressed mood. He expresses suicidal ideation.       Patient unwilling to answer direct questions.    ED Course  Procedures (including critical care time)  Labs Reviewed  ETHANOL - Abnormal; Notable for the following:    Alcohol, Ethyl (B) 91 (*)    All other components within normal limits  COMPREHENSIVE METABOLIC PANEL - Abnormal; Notable for the following:    Potassium 3.3 (*)    Glucose, Bld 112 (*)    All other components within normal limits  CBC  URINALYSIS, ROUTINE W REFLEX MICROSCOPIC  URINE RAPID DRUG SCREEN (HOSP PERFORMED)   No results found.   1. Suicidal ideations     1:43 AM patient seen and examined. Workup pending.  2:49 AM ACT to see.   6:15 AM tele-psych complete. Patient able to be discharged home.  MDM  Suicidal ideation.        Carolee Rota, PA 01/04/11 0249  Carolee Rota, PA 01/04/11 [phone]

## 2011-01-04 NOTE — BH Assessment (Signed)
Assessment Note   Erik [name] is a 36 y.o. male who presents to [facility] with SI, no specific plan.  Pt reports only that he feels bad.  Pt has been drinking this evening, BAL 91.  Pt is currently homeless and has been refusing care while in the ed, only sleeping and also refusing to complete assessment with behavioral health counselor.  Pt has poor hygiene and grooming habits and it is uncertain if pt is taking any psych meds at this time.  Pt will provided a telepsych consult to complete disposition.    Axis I: Schizoaffective Disorder Axis II: Deferred Axis III:  Past Medical History  Diagnosis Date  . Schizoaffective disorder   . Depression   . Suicidal thoughts    Axis IV: housing problems, other psychosocial or environmental problems, problems related to social environment and problems with primary support group Axis V: 31-40 impairment in reality testing  Past Medical History:  Past Medical History  Diagnosis Date  . Schizoaffective disorder   . Depression   . Suicidal thoughts     History reviewed. No pertinent past surgical history.  Family History: No family history on file.  Social History:  reports that he has been smoking Cigarettes.  He has been smoking about .5 packs per day. He does not have any smokeless tobacco history on file. He reports that he drinks alcohol. He reports that he does not use illicit drugs.  Additional Social History:  Alcohol / Drug Use Pain Medications: None  Prescriptions: None  Over the Counter: None  History of alcohol / drug use?: Yes Longest period of sobriety (when/how long): None  (Pt has past hx of SA use. UDS Neg; BAL 91) Allergies: No Known Allergies  Home Medications:  Medications Prior to Admission  Medication Dose Route Frequency Provider Last Rate Last Dose  . alum & mag hydroxide-simeth (MAALOX/MYLANTA) 200-200-20 MG/5ML suspension 30 mL  30 mL Oral PRN Carolee Rota, PA      . ibuprofen (ADVIL,MOTRIN) tablet 600 mg  600  mg Oral Q8H PRN Carolee Rota, PA      . LORazepam (ATIVAN) tablet 1 mg  1 mg Oral Q8H PRN Carolee Rota, PA      . nicotine (NICODERM CQ - dosed in mg/24 hours) patch 21 mg  21 mg Transdermal Daily Carolee Rota, [place]      . ondansetron (ZOFRAN) tablet 4 mg  4 mg Oral Q8H PRN Carolee Rota, PA       Medications Prior to Admission  Medication Sig Dispense Refill  . divalproex (DEPAKOTE ER) 500 MG 24 hr tablet Take 1 tablet (500 mg total) by mouth daily.  30 tablet  0    OB/GYN Status:  No LMP for male patient.  General Assessment Data [facility]: [facility] Living Arrangements: Homeless Can pt return to current living arrangement?: Yes Admission Status: Voluntary Is patient capable of signing voluntary admission?: Yes Transfer from: Acute Hospital Referral Source: MD  Education Status Is patient currently in school?: No Current Grade: None  Highest grade of school patient has completed: Unk Name of school: None Contact person: None   Risk to self Suicidal Ideation: Yes-Currently Present Suicidal Intent: No Is patient at risk for suicide?: No Suicidal Plan?: No Access to Means: No What has been your use of drugs/alcohol within the last 12 months?: None  Previous Attempts/Gestures: No (Thoughts only ) How many times?: 0  Other Self Harm Risks: None  Triggers  for Past Attempts: None known Intentional Self Injurious Behavior: None Factors that decrease suicide risk:  (None ) Family Suicide History: No Recent stressful life event(s): Other (Comment) (Homeless ) Persecutory voices/beliefs?: No Depression: Yes Depression Symptoms: Loss of interest in usual pleasures;Feeling angry/irritable Substance abuse history and/or treatment for substance abuse?: Yes (Pt has past hx of SA ) Suicide prevention information given to non-admitted patients: Not applicable  Risk to Others Homicidal Ideation: No Thoughts of Harm to Others: No Current Homicidal Intent:  No Current Homicidal Plan: No Access to Homicidal Means: No Identified Victim: None  History of harm to others?: No Assessment of Violence: None Noted Violent Behavior Description: None  Does patient have access to weapons?: No Criminal Charges Pending?: No Does patient have a court date: No  Psychosis Hallucinations: None noted Delusions: None noted  Mental Status Report Appear/Hygiene: Disheveled;Poor hygiene Eye Contact: Poor Motor Activity: Unremarkable Speech: Incoherent Level of Consciousness: Irritable Mood: Depressed Affect: Depressed Anxiety Level: None Thought Processes: Relevant Judgement: Unimpaired Orientation: Person;Place;Time;Situation Obsessive Compulsive Thoughts/Behaviors: None  Cognitive Functioning Concentration: Normal Memory: Recent Intact;Remote Intact IQ: Average Insight: Fair Impulse Control: Fair Appetite: Good Weight Loss: 0  Weight Gain: 0  Sleep: No Change Total Hours of Sleep: 8  Vegetative Symptoms: Not bathing Vegetative Symptoms: Not bathing;Decreased grooming  Prior Inpatient Therapy Prior Inpatient Therapy: Yes Prior Therapy Dates: 2009-2012 Prior Therapy Facilty/Provider(s): [facility] Reason for Treatment: Depression/SI   Prior Outpatient Therapy Prior Outpatient Therapy: No Prior Therapy Dates: None  Prior Therapy Facilty/Provider(s): None  Reason for Treatment: None   ADL Screening (condition at time of admission) Patient's cognitive ability adequate to safely complete daily activities?: Yes Patient able to express need for assistance with ADLs?: Yes Independently performs ADLs?: Yes Weakness of Legs: None Weakness of Arms/Hands: None       Abuse/Neglect Assessment (Assessment to be complete while patient is alone) Physical Abuse: Denies Verbal Abuse: Denies Sexual Abuse: Denies Exploitation of patient/patient's resources: Denies Self-Neglect: Denies Values / Beliefs Cultural Requests During Hospitalization:  None Spiritual Requests During Hospitalization: None Consults Spiritual Care Consult Needed: No Social Work Consult Needed: No Merchant navy officer (For Healthcare) Advance Directive: Patient does not have advance directive;Patient would not like information Pre-existing out of facility DNR order (yellow form or pink MOST form): No    Additional Information 1:1 In Past 12 Months?: No CIRT Risk: No Elopement Risk: No Does patient have medical clearance?: Yes     Disposition:  Disposition Disposition of Patient: Other dispositions (Telepsych requested) Type of inpatient treatment program:  (None ) Type of treatment offered and refused: Out-patient Other disposition(s): To current provider Patient referred to: [facility];Outpatient clinic referral  On Site Evaluation by:   Reviewed with Physician:     Murrell Redden 01/04/2011 4:45 AM

## 2011-01-04 NOTE — ED Provider Notes (Signed)
Medical screening examination/treatment/procedure(s) were performed by non-physician practitioner and as supervising physician I was immediately available for consultation/collaboration.   Joya Gaskins, MD 01/04/11 [phone]

## 2011-01-08 ENCOUNTER — Encounter (HOSPITAL_COMMUNITY): Payer: Self-pay | Admitting: *Deleted

## 2011-01-08 ENCOUNTER — Emergency Department (HOSPITAL_COMMUNITY)
Admission: EM | Admit: 2011-01-08 | Discharge: 2011-01-10 | Disposition: A | Discharge status: Home / Self Care | Payer: Medicaid Other | Attending: Emergency Medicine | Admitting: Emergency Medicine

## 2011-01-08 DIAGNOSIS — IMO0002 Reserved for concepts with insufficient information to code with codable children: Secondary | ICD-10-CM | POA: Insufficient documentation

## 2011-01-08 DIAGNOSIS — F329 Major depressive disorder, single episode, unspecified: Secondary | ICD-10-CM

## 2011-01-08 DIAGNOSIS — F3289 Other specified depressive episodes: Secondary | ICD-10-CM | POA: Insufficient documentation

## 2011-01-08 DIAGNOSIS — F22 Delusional disorders: Secondary | ICD-10-CM | POA: Insufficient documentation

## 2011-01-08 MED ORDER — ALUM & MAG HYDROXIDE-SIMETH 200-200-20 MG/5ML PO SUSP: 30.0000 mL | ORAL | Status: DC | PRN

## 2011-01-08 MED ORDER — ONDANSETRON HCL 4 MG PO TABS: 4.0000 mg | ORAL_TABLET | Freq: Three times a day (TID) | ORAL | Status: DC | PRN

## 2011-01-08 MED ORDER — ACETAMINOPHEN 325 MG PO TABS: 650.0000 mg | ORAL_TABLET | ORAL | Status: DC | PRN

## 2011-01-08 MED ORDER — LORAZEPAM 1 MG PO TABS
1.0000 mg | ORAL_TABLET | Freq: Three times a day (TID) | ORAL | Status: DC | PRN
  Administered 2011-01-09: 1 mg via ORAL
  Filled 2011-01-08: qty 1

## 2011-01-08 MED ORDER — ZOLPIDEM TARTRATE 5 MG PO TABS
5.0000 mg | ORAL_TABLET | Freq: Every evening | ORAL | Status: DC | PRN
  Administered 2011-01-09: 5 mg via ORAL
  Filled 2011-01-08: qty 1

## 2011-01-08 MED ORDER — IBUPROFEN 600 MG PO TABS: 600.0000 mg | ORAL_TABLET | Freq: Three times a day (TID) | ORAL | Status: DC | PRN
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
## 2011-01-09 LAB — COMPREHENSIVE METABOLIC PANEL WITH GFR
ALT: 19 U/L (ref 0–53)
AST: 24 U/L (ref 0–37)
Albumin: 3.4 g/dL — ABNORMAL LOW (ref 3.5–5.2)
Alkaline Phosphatase: 70 U/L (ref 39–117)
BUN: 11 mg/dL (ref 6–23)
CO2: 27 meq/L (ref 19–32)
Calcium: 9.6 mg/dL (ref 8.4–10.5)
Chloride: 102 meq/L (ref 96–112)
Creatinine, Ser: 0.8 mg/dL (ref 0.50–1.35)
GFR calc Af Amer: >90 mL/min
GFR calc non Af Amer: >90 mL/min
Glucose, Bld: 95 mg/dL (ref 70–99)
Potassium: 3.7 meq/L (ref 3.5–5.1)
Sodium: 137 meq/L (ref 135–145)
Total Bilirubin: 0.4 mg/dL (ref 0.3–1.2)
Total Protein: 6.9 g/dL (ref 6.0–8.3)

## 2011-01-09 LAB — CBC
HCT: 42.5 % (ref 39.0–52.0)
Hemoglobin: 14.6 g/dL (ref 13.0–17.0)
MCH: 30 pg (ref 26.0–34.0)
MCHC: 34.4 g/dL (ref 30.0–36.0)
MCV: 87.3 fL (ref 78.0–100.0)
Platelets: 165 K/uL (ref 150–400)
RBC: 4.87 MIL/uL (ref 4.22–5.81)
RDW: 14.2 % (ref 11.5–15.5)
WBC: 6.3 K/uL (ref 4.0–10.5)

## 2011-01-09 LAB — RAPID URINE DRUG SCREEN, HOSP PERFORMED
Amphetamines: NOT DETECTED
Barbiturates: NOT DETECTED

## 2011-01-09 LAB — ETHANOL: Alcohol, Ethyl (B): <11 mg/dL (ref 0–11)

## 2011-01-09 LAB — ACETAMINOPHEN LEVEL: Acetaminophen (Tylenol), Serum: <15 ug/mL (ref 10–30)

## 2011-01-09 MED ORDER — HALOPERIDOL 1 MG PO TABS
0.5000 mg | ORAL_TABLET | ORAL | Status: DC
  Administered 2011-01-09: 0.5 mg via ORAL
  Filled 2011-01-09: qty 1

## 2011-01-09 NOTE — ED Notes (Signed)
Snack given.
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
## 2011-01-09 NOTE — ED Notes (Signed)
Pt reports he was on cogentin 2mg , haldol 5 mg and depakote 500mg  and that it worked well for him
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
## 2011-01-09 NOTE — ED Provider Notes (Signed)
Medical screening examination/treatment/procedure(s) were performed by non-physician practitioner and as supervising physician I was immediately available for consultation/collaboration.  Fredrico Beedle P Addyson Traub, MD 01/09/11 0024 

## 2011-01-09 NOTE — ED Notes (Signed)
Pt refusing lab work at this time

## 2011-01-10 NOTE — Discharge Planning (Signed)
Patient was seen by telepsych who recommends discharge. Discussed with EDP who is in agreement with disposition.  Patient's nurse advised. No additional information needed from ACT/SW.  Ileene Hutchinson , MSW, LCSWA 01/10/2011 10:43 AM [phone]

## 2011-01-10 NOTE — ED Provider Notes (Signed)
Well known to psych service. Cleared for d/c by telepsych. D/C home. No current SI/HI  Loren Racer, MD 01/10/11 1103

## 2011-01-11 ENCOUNTER — Emergency Department (HOSPITAL_COMMUNITY)
Admission: EM | Admit: 2011-01-11 | Discharge: 2011-01-11 | Disposition: A | Discharge status: Home / Self Care | Payer: Medicaid Other | Attending: Emergency Medicine | Admitting: Emergency Medicine

## 2011-01-11 ENCOUNTER — Encounter (HOSPITAL_COMMUNITY): Payer: Self-pay | Admitting: *Deleted

## 2011-01-11 ENCOUNTER — Emergency Department (HOSPITAL_COMMUNITY): Payer: Medicaid Other

## 2011-01-11 DIAGNOSIS — IMO0001 Reserved for inherently not codable concepts without codable children: Secondary | ICD-10-CM | POA: Insufficient documentation

## 2011-01-11 DIAGNOSIS — Z79899 Other long term (current) drug therapy: Secondary | ICD-10-CM | POA: Insufficient documentation

## 2011-01-11 DIAGNOSIS — F329 Major depressive disorder, single episode, unspecified: Secondary | ICD-10-CM | POA: Insufficient documentation

## 2011-01-11 DIAGNOSIS — M2550 Pain in unspecified joint: Secondary | ICD-10-CM | POA: Insufficient documentation

## 2011-01-11 DIAGNOSIS — F29 Unspecified psychosis not due to a substance or known physiological condition: Secondary | ICD-10-CM | POA: Insufficient documentation

## 2011-01-11 DIAGNOSIS — R4182 Altered mental status, unspecified: Secondary | ICD-10-CM | POA: Insufficient documentation

## 2011-01-11 DIAGNOSIS — R52 Pain, unspecified: Secondary | ICD-10-CM | POA: Insufficient documentation

## 2011-01-11 DIAGNOSIS — F259 Schizoaffective disorder, unspecified: Secondary | ICD-10-CM | POA: Insufficient documentation

## 2011-01-11 DIAGNOSIS — F3289 Other specified depressive episodes: Secondary | ICD-10-CM | POA: Insufficient documentation

## 2011-01-11 LAB — URINALYSIS, ROUTINE W REFLEX MICROSCOPIC
Bilirubin Urine: NEGATIVE
Hgb urine dipstick: NEGATIVE
Nitrite: NEGATIVE
Specific Gravity, Urine: 1.021 (ref 1.005–1.030)
pH: 6 (ref 5.0–8.0)

## 2011-01-11 LAB — RAPID URINE DRUG SCREEN, HOSP PERFORMED
Barbiturates: NOT DETECTED
Benzodiazepines: NOT DETECTED
Cocaine: NOT DETECTED
Opiates: NOT DETECTED
Tetrahydrocannabinol: NOT DETECTED

## 2011-01-11 LAB — DIFFERENTIAL
Lymphocytes Relative: 24 % (ref 12–46)
Lymphs Abs: 4.9 10*3/uL — ABNORMAL HIGH (ref 0.7–4.0)
Monocytes Relative: 2 % — ABNORMAL LOW (ref 3–12)
Neutro Abs: 14.8 10*3/uL — ABNORMAL HIGH (ref 1.7–7.7)
Neutrophils Relative %: 73 % (ref 43–77)

## 2011-01-11 LAB — BASIC METABOLIC PANEL
BUN: 61 mg/dL — ABNORMAL HIGH (ref 6–23)
Calcium: 9.4 mg/dL (ref 8.4–10.5)
Chloride: 91 mEq/L — ABNORMAL LOW (ref 96–112)
GFR calc Af Amer: >90 mL/min (ref >90)
GFR calc non Af Amer: >90 mL/min (ref >90)
Glucose, Bld: 218 mg/dL — ABNORMAL HIGH (ref 70–99)
Glucose, Bld: 102 mg/dL — ABNORMAL HIGH (ref 70–99)
Potassium: 4.9 mEq/L (ref 3.5–5.1)
Potassium: 3.8 mEq/L (ref 3.5–5.1)
Sodium: 138 mEq/L (ref 135–145)

## 2011-01-11 LAB — POCT I-STAT, CHEM 8
Creatinine, Ser: 1 mg/dL (ref 0.50–1.35)
HCT: 48 % (ref 39.0–52.0)
Hemoglobin: 16.3 g/dL (ref 13.0–17.0)
Potassium: 3.8 mEq/L (ref 3.5–5.1)
Sodium: 142 mEq/L (ref 135–145)
TCO2: 26 mmol/L (ref 0–100)

## 2011-01-11 LAB — CBC
Hemoglobin: 13.5 g/dL (ref 13.0–17.0)
MCH: 33 pg (ref 26.0–34.0)
RBC: 4.09 MIL/uL — ABNORMAL LOW (ref 4.22–5.81)

## 2011-01-11 LAB — ETHANOL: Alcohol, Ethyl (B): <11 mg/dL (ref 0–11)

## 2011-01-11 MED ORDER — LORAZEPAM 1 MG PO TABS
2.0000 mg | ORAL_TABLET | Freq: Once | ORAL | Status: AC
  Administered 2011-01-11: 2 mg via ORAL
  Filled 2011-01-11: qty 2

## 2011-01-11 MED ORDER — ZIPRASIDONE MESYLATE 20 MG IM SOLR
20.0000 mg | Freq: Once | INTRAMUSCULAR | Status: AC
  Administered 2011-01-11: 20 mg via INTRAMUSCULAR
  Filled 2011-01-11: qty 20

## 2011-01-11 MED ORDER — OLANZAPINE 10 MG PO TABS: 10.0000 mg | ORAL_TABLET | Freq: Every day | ORAL | Status: DC
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
## 2011-01-11 NOTE — ED Notes (Signed)
EKG completed and given to Dr. Manus Gunning along with OLD ekg at 1:15.

## 2011-01-11 NOTE — ED Notes (Signed)
Report received, assumed care.  
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
## 2011-01-11 NOTE — ED Notes (Signed)
Erik [name], Child psychotherapist called & notified of pt needing resources for shelters due to homelessness. Diet tray ordered

## 2011-01-11 NOTE — ED Notes (Signed)
To room 28 yellow. Pt's asleep and diff to wake , uncooperative and pulls covers over head. Sitter in room.

## 2011-01-11 NOTE — ED Notes (Signed)
Arranging tele-psych.  

## 2011-01-11 NOTE — Consult Note (Signed)
Clinical Social Work aware of consult and that patient is homeless requesting resources and needs for shelters and day centers. Provided information, met with RN and ACT team with regards to tele-psych and patient has been cleared and will be dc and given a bus pass for transportation.  No other needs at this time.    Ashley Jacobs, MSW LCSW [phone]

## 2011-01-11 NOTE — ED Notes (Signed)
Radiology tech to come back in 45 minutes to take the patient for his films.

## 2011-01-11 NOTE — ED Provider Notes (Signed)
telepsychiatry consult performed. Patient is deemed safe for discharge to home. Psychiatrist recommends 10 mg Zyprexa twice daily. He'll be discharged home with these recommendations. Has been provided resources for outpatient services.  Dayton Bailiff, MD 01/11/11 [phone]
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
## 2011-01-11 NOTE — ED Notes (Signed)
Telepsych in progress. 

## 2011-01-12 ENCOUNTER — Emergency Department (HOSPITAL_COMMUNITY)
Admission: EM | Admit: 2011-01-12 | Discharge: 2011-01-12 | Discharge status: Against Medical Advice | Payer: Medicaid Other | Attending: Emergency Medicine | Admitting: Emergency Medicine

## 2011-01-12 ENCOUNTER — Encounter (HOSPITAL_COMMUNITY): Payer: Self-pay | Admitting: Emergency Medicine

## 2011-01-12 DIAGNOSIS — R110 Nausea: Secondary | ICD-10-CM | POA: Insufficient documentation

## 2011-01-12 DIAGNOSIS — R10816 Epigastric abdominal tenderness: Secondary | ICD-10-CM | POA: Insufficient documentation

## 2011-01-12 DIAGNOSIS — R109 Unspecified abdominal pain: Secondary | ICD-10-CM | POA: Insufficient documentation

## 2011-01-12 DIAGNOSIS — F17200 Nicotine dependence, unspecified, uncomplicated: Secondary | ICD-10-CM | POA: Insufficient documentation

## 2011-01-12 MED ORDER — ONDANSETRON HCL 4 MG/2ML IJ SOLN: 4.0000 mg | Freq: Once | INTRAMUSCULAR | Status: DC

## 2011-01-12 MED ORDER — SODIUM CHLORIDE 0.9 % IV SOLN: 999.0000 mL | INTRAVENOUS | Status: DC

## 2011-01-12 MED ORDER — GI COCKTAIL ~~LOC~~: 30.0000 mL | Freq: Once | ORAL | Status: DC

## 2011-01-12 NOTE — ED Notes (Signed)
Upon entering room, Pt was having a complete conversation with self. Almost as if he were multiple people or hearing voices.

## 2011-01-12 NOTE — ED Notes (Signed)
Pt alert, nad, c/o stomach pain, onset a week ago, "i think i have an ulcer", resp even ulaobred, skin pwd, denies n/v
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
## 2011-01-12 NOTE — ED Provider Notes (Signed)
History     CSN: [id number]  Arrival date & time 01/12/11  0419   First MD Initiated Contact with Patient 01/12/11 0725      Chief Complaint  Patient presents with  . Abdominal Pain    (Consider location/radiation/quality/duration/timing/severity/associated sxs/prior treatment) Patient is a 36 y.o. male presenting with abdominal pain. The history is provided by the patient and medical records.  Abdominal Pain The primary symptoms of the illness include abdominal pain and nausea. The primary symptoms of the illness do not include fever, shortness of breath, vomiting or diarrhea.   patient is a 36 year old, male, who drinks alcohol.  He complains of epigastric pain with nausea since this morning.  He had been draped.  The alcohol previously.  He denies vomiting, or diarrhea.  He has not had any respiratory symptoms.  He has never had abdominal surgery, and past.  Past Medical History  Diagnosis Date  . Schizoaffective disorder   . Depression   . Suicidal thoughts     History reviewed. No pertinent past surgical history.  No family history on file.  History  Substance Use Topics  . Smoking status: Current Everyday Smoker -- 0.5 packs/day    Types: Cigarettes  . Smokeless tobacco: Not on file  . Alcohol Use: Yes     social/rare      Review of Systems  Constitutional: Negative for fever.  Respiratory: Negative for cough and shortness of breath.   Cardiovascular: Negative for chest pain.  Gastrointestinal: Positive for nausea and abdominal pain. Negative for vomiting and diarrhea.  Neurological: Negative for headaches.  Psychiatric/Behavioral: Negative for confusion.  All other systems reviewed and are negative.    Allergies  Review of patient's allergies indicates no known allergies.  Home Medications   Current Outpatient Rx  Name Route Sig Dispense Refill  . BENZTROPINE MESYLATE 1 MG PO TABS Oral Take 1 mg by mouth 2 (two) times daily.      [name] DIVALPROEX SODIUM  ER 500 MG PO TB24 Oral Take 1 tablet (500 mg total) by mouth daily. 30 tablet 0  . HALOPERIDOL 0.5 MG PO TABS Oral Take 0.5 mg by mouth 2 (two) times daily.      . TRAZODONE HCL 50 MG PO TABS Oral Take 50 mg by mouth daily.      [name] OLANZAPINE 10 MG PO TABS Oral Take 1 tablet (10 mg total) by mouth at bedtime. 30 tablet 0    Wt 210 lb (95.255 kg)  Physical Exam  Vitals reviewed. Constitutional: He is oriented to person, place, and time. He appears well-developed and well-nourished. No distress.  HENT:  Head: Normocephalic and atraumatic.  Eyes: EOM are normal. Pupils are equal, round, and reactive to light.  Neck: Normal range of motion. Neck supple.  Cardiovascular: Normal rate, regular rhythm and normal heart sounds.   No murmur heard. Pulmonary/Chest: Effort normal and breath sounds normal. No respiratory distress. He has no wheezes. He has no rales.  Abdominal: Soft. Bowel sounds are normal. He exhibits no distension and no mass. There is tenderness. There is no rebound and no guarding.       Moderate epigastric tenderness without any peritoneal signs.  No hepatosplenomegaly  Musculoskeletal: Normal range of motion. He exhibits no edema and no tenderness.  Neurological: He is alert and oriented to person, place, and time. No cranial nerve deficit.  Skin: Skin is warm and dry. He is not diaphoretic.  Psychiatric: He has a normal mood and affect. His behavior  is normal.    ED Course  Procedures (including critical care time) 36 year old, male, with epigastric pain following drinking alcohol.  This morning.  No acute abdomen.  We'll establish an IV gave him antiemetics and a GI cocktail and perform laboratory testing, including a cmet and lipase   Labs Reviewed  CBC  COMPREHENSIVE METABOLIC PANEL  LIPASE, BLOOD   Dg Chest 2 View  01/11/2011  *RADIOLOGY REPORT*  Clinical Data: No chest complaints.  Medical clearance.  CHEST - 2 VIEW  Comparison: 08/13/2010.  Findings:   Cardiopericardial silhouette within normal limits. Mediastinal contours normal. Trachea midline.  No airspace disease or effusion.  IMPRESSION: No active cardiopulmonary disease.  Original Report Authenticated By: Andreas Newport, M.D.   Ct Head Wo Contrast  01/11/2011  *RADIOLOGY REPORT*  Clinical Data: Medical clearance.  Altered mental status.  Altered level of consciousness.  CT HEAD WITHOUT CONTRAST  Technique:  Contiguous axial images were obtained from the base of the skull through the vertex without contrast.  Comparison: None.  Findings: Opacification of the majority of the ethmoid air cells. Mild mucosal thickening in the frontal sinuses.  Sphenoid sinuses are clear.  Mastoid air cells clear.  Calvarium intact. No mass lesion, mass effect, midline shift, hydrocephalus, hemorrhage.  No territorial ischemia or acute infarction.  Benign basal ganglia calcifications.  IMPRESSION: Negative CT head.  Paranasal sinus disease.  Original Report Authenticated By: Andreas Newport, M.D.     No diagnosis found.  Pt eloped.  MDM  Abdominal pain        Nicholes Stairs, MD 01/12/11 [phone]

## 2011-01-13 ENCOUNTER — Emergency Department (HOSPITAL_COMMUNITY)
Admission: EM | Admit: 2011-01-13 | Discharge: 2011-01-14 | Disposition: A | Discharge status: Home / Self Care | Payer: Medicaid Other | Attending: Emergency Medicine | Admitting: Emergency Medicine

## 2011-01-13 ENCOUNTER — Encounter (HOSPITAL_COMMUNITY): Payer: Self-pay | Admitting: Emergency Medicine

## 2011-01-13 DIAGNOSIS — F17200 Nicotine dependence, unspecified, uncomplicated: Secondary | ICD-10-CM | POA: Insufficient documentation

## 2011-01-13 DIAGNOSIS — R45850 Homicidal ideations: Secondary | ICD-10-CM | POA: Insufficient documentation

## 2011-01-13 DIAGNOSIS — IMO0002 Reserved for concepts with insufficient information to code with codable children: Secondary | ICD-10-CM | POA: Insufficient documentation

## 2011-01-13 DIAGNOSIS — F3289 Other specified depressive episodes: Secondary | ICD-10-CM | POA: Insufficient documentation

## 2011-01-13 DIAGNOSIS — Z79899 Other long term (current) drug therapy: Secondary | ICD-10-CM | POA: Insufficient documentation

## 2011-01-13 DIAGNOSIS — F259 Schizoaffective disorder, unspecified: Secondary | ICD-10-CM | POA: Insufficient documentation

## 2011-01-13 DIAGNOSIS — F329 Major depressive disorder, single episode, unspecified: Secondary | ICD-10-CM | POA: Insufficient documentation

## 2011-01-13 NOTE — ED Notes (Signed)
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 Pt states " i am capable of doing things" "Everybody trying to help me and that is driving me crazy"." I feel like screeming right now, and that will lead to do something stupid" "pt state " I do not feel like it I want to kill somebody". [employer] at the bedside. Pt voluntarily in the ER.

## 2011-01-14 LAB — COMPREHENSIVE METABOLIC PANEL
ALT: 16 U/L (ref 0–53)
AST: 23 U/L (ref 0–37)
Alkaline Phosphatase: 69 U/L (ref 39–117)
CO2: 26 mEq/L (ref 19–32)
Calcium: 9.5 mg/dL (ref 8.4–10.5)
Chloride: 101 mEq/L (ref 96–112)
GFR calc Af Amer: >90 mL/min (ref >90)
GFR calc non Af Amer: >90 mL/min (ref >90)
Glucose, Bld: 92 mg/dL (ref 70–99)
Potassium: 3.7 mEq/L (ref 3.5–5.1)
Sodium: 136 mEq/L (ref 135–145)

## 2011-01-14 LAB — CBC
Hemoglobin: 14.7 g/dL (ref 13.0–17.0)
MCH: 29.8 pg (ref 26.0–34.0)
Platelets: 178 10*3/uL (ref 150–400)
RBC: 4.94 MIL/uL (ref 4.22–5.81)
WBC: 6.9 10*3/uL (ref 4.0–10.5)

## 2011-01-14 LAB — RAPID URINE DRUG SCREEN, HOSP PERFORMED
Amphetamines: NOT DETECTED
Barbiturates: NOT DETECTED
Opiates: NOT DETECTED
Tetrahydrocannabinol: NOT DETECTED

## 2011-01-14 MED ORDER — LORAZEPAM 1 MG PO TABS: 1.0000 mg | ORAL_TABLET | Freq: Three times a day (TID) | ORAL | Status: DC | PRN

## 2011-01-14 MED ORDER — ZOLPIDEM TARTRATE 5 MG PO TABS: 5.0000 mg | ORAL_TABLET | Freq: Every evening | ORAL | Status: DC | PRN

## 2011-01-14 MED ORDER — ACETAMINOPHEN 325 MG PO TABS: 650.0000 mg | ORAL_TABLET | ORAL | Status: DC | PRN

## 2011-01-14 MED ORDER — ALUM & MAG HYDROXIDE-SIMETH 200-200-20 MG/5ML PO SUSP: 30.0000 mL | ORAL | Status: DC | PRN

## 2011-01-14 MED ORDER — ONDANSETRON HCL 4 MG PO TABS: 4.0000 mg | ORAL_TABLET | Freq: Three times a day (TID) | ORAL | Status: DC | PRN

## 2011-01-14 MED ORDER — IBUPROFEN 600 MG PO TABS: 600.0000 mg | ORAL_TABLET | Freq: Three times a day (TID) | ORAL | Status: DC | PRN

## 2011-01-14 NOTE — Discharge Planning (Addendum)
CSW has requested telepsych on patient. Pending  telepsych and disposition.  Ileene Hutchinson , MSW, LCSWA 01/14/2011 8:19 AM [phone]  Telepsych has been completed by Dr. Jacky Kindle who recommends discharge.  EDP notified and agrees with recommendation. Patient to be discharged.  Ileene Hutchinson , MSW, LCSWA 01/14/2011 9:54 AM [phone]

## 2011-01-14 NOTE — ED Notes (Signed)
Went into room to introduce myself to patient. He was talking to himself loudly. When I asked if I could help him he told this tech to get out of his room.

## 2011-01-14 NOTE — ED Notes (Signed)
Writer in room to speak with introduce self to pt/complete assessment/go over rules of unit, pt lying in bed covered from head to toe with blanket, no eye contact, states "I'm just having problems and I don't want to talk about it anymore", pt advised when he was ready to complete assessment staff would be available.

## 2011-01-14 NOTE — ED Provider Notes (Addendum)
Pt awake and alert. Nad. Vitals normal. Labs unremarkable. Awaiting assessment teal evaluation.   Suzi Roots, MD 01/14/11 1029   Pt has had telepsych evaluation and recheck by assessment team. They recommend discharge to home without change in medication regimen. Pt calm and alert. Denies thoughts of harm to self or others, no hallucinations.   Suzi Roots, MD 01/14/11 1052

## 2011-01-14 NOTE — ED Provider Notes (Signed)
History     CSN: [id number]  Arrival date & time 01/13/11  2258   First MD Initiated Contact with Patient 01/14/11 0217      Chief Complaint  Patient presents with  . Medical Clearance     HPI  History provided by the patient. Patient is a 36 year old male with history of depression and schizoaffective disorder and prior psychosis who presents with complaints of auditory hallucinations, agitation and homicidal ideations. Patient states that "those people" are out to get him and that he wants to "kill them". Patient does not give any specifics as to who these "people" are. Patient is very short in his responses. Patient reports having not taken his normal medications the past 2-3 days. Patient denies feeling suicidal. And states that he feels personally safe. Patient also states that he wants to see a psychiatrist. Patient does not give other medical history. Patient denies any other complaints. He denies any recent injury. He denies alcohol or drug use. Patient has multiple visits to the emergency room for similar complaints and psychiatric illness.    Past Medical History  Diagnosis Date  . Schizoaffective disorder   . Depression   . Suicidal thoughts     History reviewed. No pertinent past surgical history.  History reviewed. No pertinent family history.  History  Substance Use Topics  . Smoking status: Current Everyday Smoker -- 0.5 packs/day    Types: Cigarettes  . Smokeless tobacco: Not on file  . Alcohol Use: Yes     social/rare      Review of Systems  All other systems reviewed and are negative.    Allergies  Review of patient's allergies indicates no known allergies.  Home Medications   Current Outpatient Rx  Name Route Sig Dispense Refill  . BENZTROPINE MESYLATE 1 MG PO TABS Oral Take 1 mg by mouth 2 (two) times daily.      [name] DIVALPROEX SODIUM ER 500 MG PO TB24 Oral Take 1 tablet (500 mg total) by mouth daily. 30 tablet 0  . HALOPERIDOL 0.5 MG PO TABS  Oral Take 0.5 mg by mouth 2 (two) times daily.      [name] OLANZAPINE 10 MG PO TABS Oral Take 1 tablet (10 mg total) by mouth at bedtime. 30 tablet 0  . TRAZODONE HCL 50 MG PO TABS Oral Take 50 mg by mouth daily.        BP 127/89  Pulse 91  Temp(Src) 98.2 F (36.8 C) (Oral)  Resp 20  SpO2 100%  Physical Exam  Nursing note and vitals reviewed. Constitutional: He is oriented to person, place, and time. He appears well-developed and well-nourished.  HENT:  Head: Normocephalic and atraumatic.  Eyes: Conjunctivae are normal.  Cardiovascular: Normal rate and regular rhythm.   Pulmonary/Chest: Effort normal and breath sounds normal.  Neurological: He is alert and oriented to person, place, and time.  Psychiatric: His affect is angry. He is agitated and actively hallucinating. He is not combative. He expresses homicidal ideation. He expresses no suicidal ideation. He expresses no suicidal plans and no homicidal plans.    ED Course  Procedures (including critical care time)   Labs Reviewed  CBC  COMPREHENSIVE METABOLIC PANEL  ETHANOL  ACETAMINOPHEN LEVEL  URINE RAPID DRUG SCREEN (HOSP PERFORMED)   Results for orders placed during the hospital encounter of 01/13/11  CBC      Component Value Range   WBC 6.9  4.0 - 10.5 (K/uL)   RBC 4.94  4.22 - 5.81 (  MIL/uL)   Hemoglobin 14.7  13.0 - 17.0 (g/dL)   HCT 62.1  30.8 - 65.7 (%)   MCV 86.8  78.0 - 100.0 (fL)   MCH 29.8  26.0 - 34.0 (pg)   MCHC 34.3  30.0 - 36.0 (g/dL)   RDW 84.6  96.2 - 95.2 (%)   Platelets 178  150 - 400 (K/uL)  COMPREHENSIVE METABOLIC PANEL      Component Value Range   Sodium 136  135 - 145 (mEq/L)   Potassium 3.7  3.5 - 5.1 (mEq/L)   Chloride 101  96 - 112 (mEq/L)   CO2 26  19 - 32 (mEq/L)   Glucose, Bld 92  70 - 99 (mg/dL)   BUN 15  6 - 23 (mg/dL)   Creatinine, Ser 8.41  0.50 - 1.35 (mg/dL)   Calcium 9.5  8.4 - 32.4 (mg/dL)   Total Protein 7.2  6.0 - 8.3 (g/dL)   Albumin 3.7  3.5 - 5.2 (g/dL)   AST 23  0 - 37  (U/L)   ALT 16  0 - 53 (U/L)   Alkaline Phosphatase 69  39 - 117 (U/L)   Total Bilirubin 0.5  0.3 - 1.2 (mg/dL)   GFR calc non Af Amer >90  >90 (mL/min)   GFR calc Af Amer >90  >90 (mL/min)  ETHANOL      Component Value Range   Alcohol, Ethyl (B) <11  0 - 11 (mg/dL)  ACETAMINOPHEN LEVEL      Component Value Range   Acetaminophen (Tylenol), Serum <15.0  10 - 30 (ug/mL)  URINE RAPID DRUG SCREEN (HOSP PERFORMED)      Component Value Range   Opiates NONE DETECTED  NONE DETECTED    Cocaine NONE DETECTED  NONE DETECTED    Benzodiazepines NONE DETECTED  NONE DETECTED    Amphetamines NONE DETECTED  NONE DETECTED    Tetrahydrocannabinol NONE DETECTED  NONE DETECTED    Barbiturates NONE DETECTED  NONE DETECTED      1. Homicidal ideation       MDM  2:30 AM patient seen and evaluated. Patient no acute distress.   Psych holding orders in place. [facility] act team contacted and they will see patient and evaluate.     Angus Seller, [place] 01/14/11 [phone]

## 2011-01-14 NOTE — ED Provider Notes (Signed)
Medical screening examination/treatment/procedure(s) were performed by non-physician practitioner and as supervising physician I was immediately available for consultation/collaboration.   Keirstan Iannello L Jo Booze, MD 01/14/11 0642 

## 2011-01-15 ENCOUNTER — Ambulatory Visit (HOSPITAL_COMMUNITY)
Admission: RE | Admit: 2011-01-15 | Discharge: 2011-01-15 | Disposition: A | Discharge status: Home / Self Care | Payer: Medicaid Other | Source: Ambulatory Visit | Attending: Psychiatry | Admitting: Psychiatry

## 2011-01-16 ENCOUNTER — Encounter (HOSPITAL_COMMUNITY): Payer: Self-pay | Admitting: *Deleted

## 2011-01-16 ENCOUNTER — Emergency Department (HOSPITAL_COMMUNITY)
Admission: EM | Admit: 2011-01-16 | Discharge: 2011-01-16 | Disposition: A | Discharge status: Home / Self Care | Payer: Medicaid Other | Attending: Emergency Medicine | Admitting: Emergency Medicine

## 2011-01-16 ENCOUNTER — Telehealth (HOSPITAL_COMMUNITY): Payer: Self-pay | Admitting: *Deleted

## 2011-01-16 ENCOUNTER — Encounter (HOSPITAL_COMMUNITY): Payer: Self-pay | Admitting: Emergency Medicine

## 2011-01-16 DIAGNOSIS — F329 Major depressive disorder, single episode, unspecified: Secondary | ICD-10-CM | POA: Insufficient documentation

## 2011-01-16 DIAGNOSIS — R443 Hallucinations, unspecified: Secondary | ICD-10-CM | POA: Insufficient documentation

## 2011-01-16 DIAGNOSIS — F1910 Other psychoactive substance abuse, uncomplicated: Secondary | ICD-10-CM

## 2011-01-16 DIAGNOSIS — Z79899 Other long term (current) drug therapy: Secondary | ICD-10-CM | POA: Insufficient documentation

## 2011-01-16 DIAGNOSIS — Z8659 Personal history of other mental and behavioral disorders: Secondary | ICD-10-CM | POA: Insufficient documentation

## 2011-01-16 DIAGNOSIS — F3289 Other specified depressive episodes: Secondary | ICD-10-CM | POA: Insufficient documentation

## 2011-01-16 DIAGNOSIS — F17200 Nicotine dependence, unspecified, uncomplicated: Secondary | ICD-10-CM | POA: Insufficient documentation

## 2011-01-16 DIAGNOSIS — Z5900 Homelessness unspecified: Secondary | ICD-10-CM | POA: Insufficient documentation

## 2011-01-16 DIAGNOSIS — F411 Generalized anxiety disorder: Secondary | ICD-10-CM | POA: Insufficient documentation

## 2011-01-16 LAB — CBC
MCH: 29.9 pg (ref 26.0–34.0)
MCHC: 34 g/dL (ref 30.0–36.0)
MCV: 87.8 fL (ref 78.0–100.0)
Platelets: 187 10*3/uL (ref 150–400)
RBC: 4.99 MIL/uL (ref 4.22–5.81)
RDW: 14.1 % (ref 11.5–15.5)

## 2011-01-16 LAB — COMPREHENSIVE METABOLIC PANEL
AST: 20 U/L (ref 0–37)
CO2: 25 mEq/L (ref 19–32)
Calcium: 9.6 mg/dL (ref 8.4–10.5)
Creatinine, Ser: 0.82 mg/dL (ref 0.50–1.35)
GFR calc non Af Amer: >90 mL/min (ref >90)
Total Protein: 7.4 g/dL (ref 6.0–8.3)

## 2011-01-16 LAB — DIFFERENTIAL
Basophils Absolute: 0 10*3/uL (ref 0.0–0.1)
Eosinophils Relative: 3 % (ref 0–5)
Lymphocytes Relative: 48 % — ABNORMAL HIGH (ref 12–46)
Lymphs Abs: 3.2 10*3/uL (ref 0.7–4.0)
Monocytes Absolute: 0.7 10*3/uL (ref 0.1–1.0)

## 2011-01-16 LAB — RAPID URINE DRUG SCREEN, HOSP PERFORMED
Benzodiazepines: NOT DETECTED
Cocaine: NOT DETECTED
Opiates: NOT DETECTED
Tetrahydrocannabinol: NOT DETECTED

## 2011-01-16 MED ORDER — ONDANSETRON HCL 4 MG PO TABS: 4.0000 mg | ORAL_TABLET | Freq: Three times a day (TID) | ORAL | Status: DC | PRN

## 2011-01-16 MED ORDER — BENZTROPINE MESYLATE 1 MG/ML IJ SOLN
1.0000 mg | Freq: Once | INTRAMUSCULAR | Status: DC
  Filled 2011-01-16: qty 2

## 2011-01-16 MED ORDER — TRAZODONE HCL 50 MG PO TABS: 50.0000 mg | ORAL_TABLET | Freq: Every day | ORAL | Status: DC

## 2011-01-16 MED ORDER — NITROGLYCERIN 0.4 MG SL SUBL
SUBLINGUAL_TABLET | SUBLINGUAL | Status: AC
  Filled 2011-01-16: qty 25

## 2011-01-16 MED ORDER — OLANZAPINE 5 MG PO TABS: 10.0000 mg | ORAL_TABLET | Freq: Every day | ORAL | Status: DC

## 2011-01-16 MED ORDER — LORAZEPAM 1 MG PO TABS: 1.0000 mg | ORAL_TABLET | Freq: Three times a day (TID) | ORAL | Status: DC | PRN

## 2011-01-16 MED ORDER — HALOPERIDOL 1 MG PO TABS
0.5000 mg | ORAL_TABLET | Freq: Once | ORAL | Status: AC
  Administered 2011-01-16: 0.5 mg via ORAL
  Filled 2011-01-16: qty 1

## 2011-01-16 MED ORDER — BENZTROPINE MESYLATE 1 MG PO TABS
1.0000 mg | ORAL_TABLET | Freq: Once | ORAL | Status: AC
  Administered 2011-01-16: 1 mg via ORAL
  Filled 2011-01-16: qty 1

## 2011-01-16 MED ORDER — ASPIRIN 81 MG PO CHEW
CHEWABLE_TABLET | ORAL | Status: AC
  Filled 2011-01-16: qty 4

## 2011-01-16 NOTE — Progress Notes (Signed)
Telepsych w/ Dr. Trisha Mangle completed. Waiting for eval to be faxed.

## 2011-01-16 NOTE — ED Notes (Signed)
Pt. Inventory: jeans, belt, 2 tshirts, 1 pair of boots, 1 jacket, 1 pair of socks, 1 watch. 2 bags of belongings located behind nurses station in triage

## 2011-01-16 NOTE — ED Notes (Signed)
Pt. In blue scrubs and red socks. -- Pt. And belongings both wanded by security.  

## 2011-01-16 NOTE — ED Provider Notes (Signed)
Medical screening examination/treatment/procedure(s) were performed by non-physician practitioner and as supervising physician I was immediately available for consultation/collaboration.   Dione Booze, MD 01/16/11 [phone]

## 2011-01-16 NOTE — ED Provider Notes (Signed)
History     CSN: [id number]  Arrival date & time 01/16/11  0027   First MD Initiated Contact with Patient 01/16/11 0225      Chief Complaint  Patient presents with  . Medical Clearance     HPI  History provided by the patient. Patient is a 36 year old male with history of schizoaffective disorder, depression and anxiety with multiple visits to the emergency room who returns tonight with complaints that he's not feeling right and needs to see a psychiatrist. Patient was just discharged from the emergency room one day ago for similar complaints. Patient had telemetry psych evaluation at that time with recommendations for him to continue his normal medications. Patient states he is currently homeless and does not have any of his medications. He has not taken any of his medications since leaving the emergency room. Patient states that while he is in the hospital he feels better medicine as he leaves he becomes agitated, angry and feels like hurting others. At this time patient does not express SI/HI to me. He admits that he hears voices. He does not give any specifics other information about his hallucinations. Patient does not have any other complaints.    Past Medical History  Diagnosis Date  . Schizoaffective disorder   . Depression   . Suicidal thoughts   . Anxiety     History reviewed. No pertinent past surgical history.  History reviewed. No pertinent family history.  History  Substance Use Topics  . Smoking status: Current Everyday Smoker -- 0.5 packs/day    Types: Cigarettes  . Smokeless tobacco: Not on file  . Alcohol Use: No     social/rare      Review of Systems  All other systems reviewed and are negative.    Allergies  Review of patient's allergies indicates no known allergies.  Home Medications   Current Outpatient Rx  Name Route Sig Dispense Refill  . BENZTROPINE MESYLATE 1 MG PO TABS Oral Take 1 mg by mouth 2 (two) times daily.      [name] DIVALPROEX  SODIUM ER 500 MG PO TB24 Oral Take 1 tablet (500 mg total) by mouth daily. 30 tablet 0  . HALOPERIDOL 0.5 MG PO TABS Oral Take 0.5 mg by mouth 2 (two) times daily.      [name] OLANZAPINE 10 MG PO TABS Oral Take 1 tablet (10 mg total) by mouth at bedtime. 30 tablet 0  . TRAZODONE HCL 50 MG PO TABS Oral Take 50 mg by mouth daily.        BP 115/72  Pulse 98  Temp(Src) 98.5 F (36.9 C) (Oral)  Resp 18  SpO2 96%  Physical Exam  Nursing note and vitals reviewed. Constitutional: He is oriented to person, place, and time. He appears well-developed and well-nourished.  HENT:  Head: Normocephalic.  Cardiovascular: Normal rate and regular rhythm.   Pulmonary/Chest: Breath sounds normal. No respiratory distress. He has no wheezes. He has no rales.  Abdominal: Soft.  Neurological: He is alert and oriented to person, place, and time.  Skin: Skin is warm and dry.  Psychiatric: His mood appears anxious. He is actively hallucinating. He exhibits a depressed mood. He expresses no homicidal and no suicidal ideation.    ED Course  Procedures   Labs Reviewed  DIFFERENTIAL - Abnormal; Notable for the following:    Neutrophils Relative 38 (*)    Lymphocytes Relative 48 (*)    All other components within normal limits  CBC  COMPREHENSIVE METABOLIC  PANEL  ETHANOL  URINE RAPID DRUG SCREEN (HOSP PERFORMED)   Results for orders placed during the hospital encounter of 01/16/11  CBC      Component Value Range   WBC 6.7  4.0 - 10.5 (K/uL)   RBC 4.99  4.22 - 5.81 (MIL/uL)   Hemoglobin 14.9  13.0 - 17.0 (g/dL)   HCT 57.8  46.9 - 62.9 (%)   MCV 87.8  78.0 - 100.0 (fL)   MCH 29.9  26.0 - 34.0 (pg)   MCHC 34.0  30.0 - 36.0 (g/dL)   RDW 52.8  41.3 - 24.4 (%)   Platelets 187  150 - 400 (K/uL)  COMPREHENSIVE METABOLIC PANEL      Component Value Range   Sodium 138  135 - 145 (mEq/L)   Potassium 3.9  3.5 - 5.1 (mEq/L)   Chloride 103  96 - 112 (mEq/L)   CO2 25  19 - 32 (mEq/L)   Glucose, Bld 98  70 - 99  (mg/dL)   BUN 20  6 - 23 (mg/dL)   Creatinine, Ser 0.10  0.50 - 1.35 (mg/dL)   Calcium 9.6  8.4 - 27.2 (mg/dL)   Total Protein 7.4  6.0 - 8.3 (g/dL)   Albumin 3.8  3.5 - 5.2 (g/dL)   AST 20  0 - 37 (U/L)   ALT 17  0 - 53 (U/L)   Alkaline Phosphatase 67  39 - 117 (U/L)   Total Bilirubin 0.3  0.3 - 1.2 (mg/dL)   GFR calc non Af Amer >90  >90 (mL/min)   GFR calc Af Amer >90  >90 (mL/min)  ETHANOL      Component Value Range   Alcohol, Ethyl (B) <11  0 - 11 (mg/dL)  URINE RAPID DRUG SCREEN (HOSP PERFORMED)      Component Value Range   Opiates NONE DETECTED  NONE DETECTED    Cocaine NONE DETECTED  NONE DETECTED    Benzodiazepines NONE DETECTED  NONE DETECTED    Amphetamines NONE DETECTED  NONE DETECTED    Tetrahydrocannabinol NONE DETECTED  NONE DETECTED    Barbiturates NONE DETECTED  NONE DETECTED   DIFFERENTIAL      Component Value Range   Neutrophils Relative 38 (*) 43 - 77 (%)   Neutro Abs 2.6  1.7 - 7.7 (K/uL)   Lymphocytes Relative 48 (*) 12 - 46 (%)   Lymphs Abs 3.2  0.7 - 4.0 (K/uL)   Monocytes Relative 10  3 - 12 (%)   Monocytes Absolute 0.7  0.1 - 1.0 (K/uL)   Eosinophils Relative 3  0 - 5 (%)   Eosinophils Absolute 0.2  0.0 - 0.7 (K/uL)   Basophils Relative 1  0 - 1 (%)   Basophils Absolute 0.0  0.0 - 0.1 (K/uL)      No diagnosis found.    MDM  2:45 AM patient seen and evaluated. Patient no acute distress.  Patient was seen for similar complaints was in 24 hours ago. Patient has multiple visits to the emergency room. During his last visit he had a tele psych evaluation with recommendations for him to continue his normal medications.    Spoke with [facility] act team. They will see patient and plan for tele psych. Holding orders in place  Phill M[place], [place] 01/16/11 [phone]

## 2011-01-16 NOTE — ED Notes (Signed)
Patient is alert and oriented x3.  He was given DC instructions and follow up visit instructions.  Patient gave verbal understanding.  He was DC ambulatory under his own power to home.  V/S stable.  He was not showing any signs of distress on DC 

## 2011-01-16 NOTE — ED Notes (Signed)
Patient walked in and sat down and states "I need to see a psychiatrist"

## 2011-01-16 NOTE — ED Provider Notes (Signed)
telepsych consultation report received.  Recommend discharging patient.  Recommend continuing haldol and cogentin.  Pt advised to be more compliant with his medications to help with his symptoms.  Discharged with strict return precautions.    Ethelda Chick, MD 01/16/11 [phone]

## 2011-01-16 NOTE — BH Assessment (Signed)
Assessment Note   Erik [name] is a 36 y.o. male who presents to [facility] with c/o hearing voices, no command at this time.  Pt has been to [facility] frequently in the last several months and has been admitted for treatment.  Pt is non-compliant with meds--stating last med intake was 3 days ago--"can't take meds because is not comfortable".  Pt also reports recent blackouts.  Pt is irritable during assessment, calling out to the [facility] when was walking by assessment room--"can you come talk to me, I'm tired of talking, I need a bed".  Pt is noted to be talking to self, disheveled, and has 3 armbands on from recent visits to local emergency department.  This Clinical research associate referred pt back to current provider and advised that no acute beds are currently available, pt replied he needed someone to make room for him.  This Clinical research associate then suggested that pt return to [facility] if needed, telepsych can be completed to determine final disposition.                                                          Axis I: Schizoaffective Disorder Axis II: Deferred Axis III:  Past Medical History  Diagnosis Date  . Schizoaffective disorder   . Depression   . Suicidal thoughts   . Anxiety    Axis IV: other psychosocial or environmental problems, problems related to social environment and problems with primary support group Axis V: 41-50 serious symptoms  Past Medical History:  Past Medical History  Diagnosis Date  . Schizoaffective disorder   . Depression   . Suicidal thoughts   . Anxiety     No past surgical history on file.  Family History: No family history on file.  Social History:  reports that he has been smoking Cigarettes.  He has been smoking about .5 packs per day. He does not have any smokeless tobacco history on file. He reports that he does not drink alcohol or use illicit drugs.  Additional Social History:  Alcohol / Drug Use Pain Medications: None  Prescriptions: None  Over the Counter: None  History  of alcohol / drug use?: Yes Substance #1 Name of Substance 1: Pt. has past hx of Cocaine Abuse  1 - Age of First Use: Teens  1 - Amount (size/oz): Unk  1 - Frequency: Unk  1 - Duration: Unk  1 - Last Use / Amount: Unk  Substance #2 Name of Substance 2: Past has past hx of THC Abuse  2 - Age of First Use: Teens  2 - Amount (size/oz): Unk  2 - Frequency: Unk  2 - Duration: Unk  2 - Last Use / Amount: Unk  Allergies: No Known Allergies  Home Medications:  No current facility-administered medications on file as of 01/16/2011.   Medications Prior to Admission  Medication Sig Dispense Refill  . benztropine (COGENTIN) 1 MG tablet Take 1 mg by mouth 2 (two) times daily.        . divalproex (DEPAKOTE ER) 500 MG 24 hr tablet Take 1 tablet (500 mg total) by mouth daily.  30 tablet  0  . haloperidol (HALDOL) 0.5 MG tablet Take 0.5 mg by mouth 2 (two) times daily.        [name] OLANZapine (ZYPREXA) 10 MG tablet Take 1  tablet (10 mg total) by mouth at bedtime.  30 tablet  0  . traZODone (DESYREL) 50 MG tablet Take 50 mg by mouth daily.          OB/GYN Status:  No LMP for male patient.  General Assessment Data Location of Assessment: [facility] Assessment Services Living Arrangements: Alone (Pt. currently lives in [place] ) Can pt return to current living arrangement?: Yes Admission Status: Voluntary Is patient capable of signing voluntary admission?: Yes Transfer from: Home Referral Source: Self/Family/Friend  Education Status Is patient currently in school?: No Current Grade: None  Highest grade of school patient has completed: Unk  Name of school: Unk  Contact person: Unk   Risk to self Suicidal Ideation: No Suicidal Intent: No Access to Means: No What has been your use of drugs/alcohol within the last 12 months?: Pt. has past hx of SA--Cocaine, THC  Previous Attempts/Gestures: No How many times?: 0  Other Self Harm Risks: None  Triggers for Past Attempts: None known Intentional Self  Injurious Behavior: None Factors that decrease suicide risk: Positive therapeutic relationships Family Suicide History: No Recent stressful life event(s): Other (Comment) (Pt. non-compliant with meds--last med intake 3 days ago ) Persecutory voices/beliefs?: No Depression: Yes Depression Symptoms: Loss of interest in usual pleasures;Feeling angry/irritable Substance abuse history and/or treatment for substance abuse?: No Suicide prevention information given to non-admitted patients: Not applicable  Risk to Others Homicidal Ideation: No Thoughts of Harm to Others: No Current Homicidal Intent: No Current Homicidal Plan: No Access to Homicidal Means: No Identified Victim: None  History of harm to others?: No Assessment of Violence: None Noted Violent Behavior Description: None  Does patient have access to weapons?: No Criminal Charges Pending?: No Does patient have a court date: No  Psychosis Hallucinations: Auditory (No command---did not disclose what voices are saying ) Delusions: None noted  Mental Status Report Appear/Hygiene: Disheveled;Poor hygiene;Layered clothes Eye Contact: Poor Motor Activity: Unremarkable Speech: Aggressive;Logical/coherent Level of Consciousness: Alert Mood: Depressed;Preoccupied;Angry Affect: Angry;Irritable;Depressed Anxiety Level: None Thought Processes: Coherent;Relevant Judgement: Unimpaired Orientation: Person;Place;Time;Situation Obsessive Compulsive Thoughts/Behaviors: None  Cognitive Functioning Concentration: Normal Memory: Recent Intact;Remote Intact IQ: Average Insight: Fair Impulse Control: Fair Appetite: Good Weight Loss: 0  Weight Gain: 0  Sleep: No Change Total Hours of Sleep: 8  Vegetative Symptoms: None Vegetative Symptoms: None  Prior Inpatient Therapy Prior Inpatient Therapy: Yes Prior Therapy Dates: 2009-2013 Prior Therapy Facilty/Provider(s): [facility] Reason for Treatment: Stabilzation  Prior  Outpatient Therapy Prior Outpatient Therapy: Yes Prior Therapy Dates: Current Prior Therapy Facilty/Provider(s): [facility]  Reason for Treatment: Med Managment  ADL Screening (condition at time of admission) Patient's cognitive ability adequate to safely complete daily activities?: Yes Patient able to express need for assistance with ADLs?: Yes Independently performs ADLs?: Yes Weakness of Legs: None Weakness of Arms/Hands: None  Home Assistive Devices/Equipment Home Assistive Devices/Equipment: None  Therapy Consults (therapy consults require a physician order) PT Evaluation Needed: No OT Evalulation Needed: No SLP Evaluation Needed: No   Values / Beliefs Cultural Requests During Hospitalization: None Spiritual Requests During Hospitalization: None Consults Spiritual Care Consult Needed: No Social Work Consult Needed: No Merchant navy officer (For Healthcare) Advance Directive: Patient does not have advance directive;Patient would not like information Pre-existing out of facility DNR order (yellow form or pink MOST form): No    Additional Information 1:1 In Past 12 Months?: No CIRT Risk: No Elopement Risk: No Does patient have medical clearance?: No     Disposition:  Disposition Disposition of Patient:  Outpatient treatment (Referred back to provider---[facility] ) Type of outpatient treatment: Adult Type of treatment offered and refused: Other (Comment) (None ) Other disposition(s): To current provider Patient referred to: Other (Comment) ([facility] )  On Site Evaluation by:   Reviewed with Physician:     Murrell Redden 01/16/2011 12:33 AM

## 2011-01-18 NOTE — ED Provider Notes (Signed)
History     CSN: [id number]  Arrival date & time 12/30/10  2138   First MD Initiated Contact with Patient 12/30/10 2328      Chief Complaint  Patient presents with  . Suicidal  . Depression    (Consider location/radiation/quality/duration/timing/severity/associated sxs/prior treatment) Patient is a 36 y.o. male presenting with mental health disorder. The history is provided by the patient. No language interpreter was used.  Mental Health Problem The primary symptoms do not include dysphoric mood. Episode onset: 12/30/10. This is a recurrent problem.  Precipitated by: nothing. The degree of incapacity that he is experiencing as a consequence of his illness is moderate. Sequelae: none. Additional symptoms of the illness do not include no decreased need for sleep. Risk factors that are present for mental illness include a history of mental illness.    Past Medical History  Diagnosis Date  . Schizoaffective disorder   . Depression   . Suicidal thoughts   . Anxiety     History reviewed. No pertinent past surgical history.  No family history on file.  History  Substance Use Topics  . Smoking status: Current Everyday Smoker -- 0.5 packs/day    Types: Cigarettes  . Smokeless tobacco: Not on file  . Alcohol Use: No     social/rare      Review of Systems  Constitutional: Negative.   HENT: Negative.   Eyes: Negative.   Respiratory: Negative.   Cardiovascular: Negative.   Gastrointestinal: Negative.   Genitourinary: Negative.   Musculoskeletal: Negative.   Skin: Negative.   Neurological: Negative.   Hematological: Negative.   Psychiatric/Behavioral: Negative for dysphoric mood.    Allergies  Review of patient's allergies indicates no known allergies.  Home Medications   Current Outpatient Rx  Name Route Sig Dispense Refill  . DIVALPROEX SODIUM ER 500 MG PO TB24 Oral Take 1 tablet (500 mg total) by mouth daily. 30 tablet 0  . BENZTROPINE MESYLATE 1 MG PO TABS  Oral Take 1 mg by mouth 2 (two) times daily.      [name] HALOPERIDOL 0.5 MG PO TABS Oral Take 0.5 mg by mouth 2 (two) times daily.      [name] OLANZAPINE 10 MG PO TABS Oral Take 1 tablet (10 mg total) by mouth at bedtime. 30 tablet 0  . TRAZODONE HCL 50 MG PO TABS Oral Take 50 mg by mouth daily.        BP 158/93  Pulse 120  Temp(Src) 98.3 F (36.8 C) (Oral)  Resp 20  Wt 250 lb (113.399 kg)  SpO2 100%  Physical Exam  Constitutional: He is oriented to person, place, and time. He appears well-developed and well-nourished.  HENT:  Head: Normocephalic and atraumatic.  Eyes: Conjunctivae are normal. Pupils are equal, round, and reactive to light.  Neck: Normal range of motion. Neck supple.  Cardiovascular: Normal rate and regular rhythm.   Pulmonary/Chest: Effort normal and breath sounds normal.  Abdominal: Soft. Bowel sounds are normal.  Musculoskeletal: Normal range of motion.  Neurological: He is alert and oriented to person, place, and time.  Skin: Skin is warm and dry.    ED Course  Procedures (including critical care time)  Labs Reviewed  COMPREHENSIVE METABOLIC PANEL - Abnormal; Notable for the following:    Glucose, Bld 133 (*)    GFR calc non Af Amer 83 (*)    All other components within normal limits  CBC  ETHANOL  URINE RAPID DRUG SCREEN (HOSP PERFORMED)  LAB REPORT - SCANNED  No results found.   1. Behavior problem       MDM  Seen multiple times for same       Ryaan Vanwagoner Smitty Cords, MD 01/19/11 0001

## 2011-03-13 ENCOUNTER — Emergency Department (HOSPITAL_COMMUNITY)
Admission: EM | Admit: 2011-03-13 | Discharge: 2011-03-14 | Disposition: A | Discharge status: Home / Self Care | Payer: Medicaid Other | Attending: Emergency Medicine | Admitting: Emergency Medicine

## 2011-03-13 DIAGNOSIS — F329 Major depressive disorder, single episode, unspecified: Secondary | ICD-10-CM | POA: Insufficient documentation

## 2011-03-13 DIAGNOSIS — Z79899 Other long term (current) drug therapy: Secondary | ICD-10-CM | POA: Insufficient documentation

## 2011-03-13 DIAGNOSIS — Z8659 Personal history of other mental and behavioral disorders: Secondary | ICD-10-CM | POA: Insufficient documentation

## 2011-03-13 DIAGNOSIS — F411 Generalized anxiety disorder: Secondary | ICD-10-CM | POA: Insufficient documentation

## 2011-03-13 DIAGNOSIS — M791 Myalgia, unspecified site: Secondary | ICD-10-CM

## 2011-03-13 DIAGNOSIS — F3289 Other specified depressive episodes: Secondary | ICD-10-CM | POA: Insufficient documentation

## 2011-03-13 DIAGNOSIS — F17200 Nicotine dependence, unspecified, uncomplicated: Secondary | ICD-10-CM | POA: Insufficient documentation

## 2011-03-13 DIAGNOSIS — IMO0001 Reserved for inherently not codable concepts without codable children: Secondary | ICD-10-CM | POA: Insufficient documentation

## 2011-03-13 MED ORDER — HALOPERIDOL 1 MG PO TABS
0.5000 mg | ORAL_TABLET | Freq: Once | ORAL | Status: AC
  Administered 2011-03-14: 0.5 mg via ORAL
  Filled 2011-03-13: qty 1

## 2011-03-13 MED ORDER — BENZTROPINE MESYLATE 1 MG PO TABS
1.0000 mg | ORAL_TABLET | Freq: Once | ORAL | Status: AC
  Administered 2011-03-14: 1 mg via ORAL
  Filled 2011-03-13: qty 1

## 2011-03-13 NOTE — ED Notes (Signed)
Per EMS, pt was c/o body aches. [place] Stated he was going to [facility] to buy his medications.  BP: 130/60 P 60 RR: 20  O2Sat: 96 RA

## 2011-03-13 NOTE — ED Provider Notes (Signed)
History     CSN: [id number]  Arrival date & time 03/13/11  2250   First MD Initiated Contact with Patient 03/13/11 2323      Chief Complaint  Patient presents with  . Generalized Body Aches    (Consider location/radiation/quality/duration/timing/severity/associated sxs/prior treatment) HPI Comments: 36 y/o male with history of schizoaffective disorder who presents with a complaint of being out of his medications. He states that he has been out for approximately 4 days and is "not feeling good" as well as having body aches. He denies any one part of his body that is hurting, denies fevers chills nausea vomiting or diarrhea and admits that he is able to eat and drink without difficulty today  The history is provided by the patient.    Past Medical History  Diagnosis Date  . Schizoaffective disorder   . Depression   . Suicidal thoughts   . Anxiety     No past surgical history on file.  No family history on file.  History  Substance Use Topics  . Smoking status: Current Everyday Smoker -- 0.5 packs/day    Types: Cigarettes  . Smokeless tobacco: Not on file  . Alcohol Use: No     social/rare      Review of Systems  Constitutional: Negative for fever and chills.  HENT: Negative for neck pain.   Gastrointestinal: Negative for nausea, vomiting, abdominal pain and diarrhea.  Skin: Negative for rash.  Neurological: Negative for headaches.    Allergies  Review of patient's allergies indicates no known allergies.  Home Medications   Current Outpatient Rx  Name Route Sig Dispense Refill  . DIVALPROEX SODIUM ER 500 MG PO TB24 Oral Take 1 tablet (500 mg total) by mouth daily. 30 tablet 0  . TRAZODONE HCL 50 MG PO TABS Oral Take 50 mg by mouth daily.      [name] BENZTROPINE MESYLATE 1 MG PO TABS Oral Take 1 tablet (1 mg total) by mouth 2 (two) times daily. 30 tablet 1  . HALOPERIDOL 0.5 MG PO TABS Oral Take 1 tablet (0.5 mg total) by mouth 2 (two) times daily. 30 tablet 1     BP 134/105  Pulse 106  Temp(Src) 97.7 F (36.5 C) (Oral)  Resp 16  SpO2 100%  Physical Exam  Nursing note and vitals reviewed. Constitutional: He appears well-developed and well-nourished. No distress.  HENT:  Head: Normocephalic and atraumatic.  Mouth/Throat: Oropharynx is clear and moist. No oropharyngeal exudate.  Eyes: Conjunctivae and EOM are normal. Pupils are equal, round, and reactive to light. Right eye exhibits no discharge. Left eye exhibits no discharge. No scleral icterus.  Neck: Normal range of motion. Neck supple. No JVD present. No thyromegaly present.  Cardiovascular: Normal rate, regular rhythm, normal heart sounds and intact distal pulses.  Exam reveals no gallop and no friction rub.   No murmur heard. Pulmonary/Chest: Effort normal and breath sounds normal. No respiratory distress. He has no wheezes. He has no rales.  Abdominal: Soft. Bowel sounds are normal. He exhibits no distension and no mass. There is no tenderness.  Musculoskeletal: Normal range of motion. He exhibits no edema and no tenderness.  Lymphadenopathy:    He has no cervical adenopathy.  Neurological: He is alert. Coordination normal.       Speech is clear, gait is normal, no limb ataxia  Skin: Skin is warm and dry. No rash noted. No erythema.  Psychiatric:       Bizarre flat affect, no suicidal thoughts  ED Course  Procedures (including critical care time)  Labs Reviewed - No data to display No results found.   1. Myalgia       MDM  Will give first dose of medications - pt states has medicaid and is waiting check to pay for medicines.  No active psychiatric emergency at this time - physical exam is reassuring taht there is no active medical emergency either.        Vida Roller, MD 03/14/11 [phone]

## 2011-03-14 MED ORDER — BENZTROPINE MESYLATE 1 MG PO TABS: 1.0000 mg | ORAL_TABLET | Freq: Two times a day (BID) | ORAL | Status: DC

## 2011-03-14 MED ORDER — HALOPERIDOL 0.5 MG PO TABS: 0.5000 mg | ORAL_TABLET | Freq: Two times a day (BID) | ORAL | Status: DC

## 2011-03-14 NOTE — Discharge Instructions (Signed)
Return for severe or worsening symptoms °

## 2011-03-17 ENCOUNTER — Emergency Department (HOSPITAL_COMMUNITY)
Admission: EM | Admit: 2011-03-17 | Discharge: 2011-03-17 | Disposition: A | Discharge status: Home / Self Care | Payer: Medicaid Other | Source: Home / Self Care | Attending: Emergency Medicine | Admitting: Emergency Medicine

## 2011-03-17 ENCOUNTER — Encounter (HOSPITAL_COMMUNITY): Payer: Self-pay | Admitting: *Deleted

## 2011-03-17 DIAGNOSIS — F319 Bipolar disorder, unspecified: Secondary | ICD-10-CM | POA: Insufficient documentation

## 2011-03-17 DIAGNOSIS — Z79899 Other long term (current) drug therapy: Secondary | ICD-10-CM | POA: Insufficient documentation

## 2011-03-17 LAB — COMPREHENSIVE METABOLIC PANEL
Albumin: 3.8 g/dL (ref 3.5–5.2)
Alkaline Phosphatase: 64 U/L (ref 39–117)
BUN: 12 mg/dL (ref 6–23)
Calcium: 9.8 mg/dL (ref 8.4–10.5)
Creatinine, Ser: 0.78 mg/dL (ref 0.50–1.35)
GFR calc Af Amer: >90 mL/min (ref >90)
Glucose, Bld: 112 mg/dL — ABNORMAL HIGH (ref 70–99)
Total Protein: 7.2 g/dL (ref 6.0–8.3)

## 2011-03-17 LAB — CBC
HCT: 44.4 % (ref 39.0–52.0)
Hemoglobin: 15.3 g/dL (ref 13.0–17.0)
MCH: 29.9 pg (ref 26.0–34.0)
MCHC: 34.5 g/dL (ref 30.0–36.0)
MCV: 86.9 fL (ref 78.0–100.0)
RDW: 13.6 % (ref 11.5–15.5)

## 2011-03-17 LAB — ETHANOL: Alcohol, Ethyl (B): <11 mg/dL (ref 0–11)

## 2011-03-17 MED ORDER — LORAZEPAM 1 MG PO TABS
1.0000 mg | ORAL_TABLET | Freq: Three times a day (TID) | ORAL | Status: DC | PRN
  Administered 2011-03-17: 1 mg via ORAL
  Filled 2011-03-17: qty 1

## 2011-03-17 MED ORDER — FLUPHENAZINE HCL 5 MG PO TABS
5.0000 mg | ORAL_TABLET | Freq: Every day | ORAL | Status: DC
  Administered 2011-03-17: 5 mg via ORAL
  Filled 2011-03-17 (×2): qty 1

## 2011-03-17 MED ORDER — ALUM & MAG HYDROXIDE-SIMETH 200-200-20 MG/5ML PO SUSP: 30.0000 mL | ORAL | Status: DC | PRN

## 2011-03-17 MED ORDER — FLUPHENAZINE HCL 5 MG PO TABS: 5.0000 mg | ORAL_TABLET | Freq: Every day | ORAL | Status: DC

## 2011-03-17 MED ORDER — ONDANSETRON HCL 4 MG PO TABS: 4.0000 mg | ORAL_TABLET | Freq: Three times a day (TID) | ORAL | Status: DC | PRN

## 2011-03-17 MED ORDER — FLUPHENAZINE HCL 5 MG PO TABS: 2.5000 mg | ORAL_TABLET | Freq: Every day | ORAL | Status: DC

## 2011-03-17 NOTE — ED Provider Notes (Addendum)
History     CSN: [id number]  Arrival date & time 03/17/11  0041   First MD Initiated Contact with Patient 03/17/11 0158      Chief Complaint  Patient presents with  . Medical Clearance    (Consider location/radiation/quality/duration/timing/severity/associated sxs/prior treatment) HPI Level 5 Caveat: Flight of ideas. This is a 36 year old black male with a history of bipolar disorder. He states that he was told by a mental health worker he needed to come to the hospital to get some psychiatric help. He states that he is having problems with a lot of people and that these pupil are causing him problems. When pressed for details he becomes very vague. He initially had knowledge that he has been hearing voices but then changed his mind and stated he is just having racing thoughts. He states he is depressed. He states he was asked to leave the hotel where he was staying and that is why he called EMS. He denies suicidal ideation at the present. He denies pain or other somatic complaints at the present time  Past Medical History  Diagnosis Date  . Schizoaffective disorder   . Depression   . Suicidal thoughts   . Anxiety     History reviewed. No pertinent past surgical history.  History reviewed. No pertinent family history.  History  Substance Use Topics  . Smoking status: Current Everyday Smoker -- 0.5 packs/day    Types: Cigarettes  . Smokeless tobacco: Not on file  . Alcohol Use: No     social/rare      Review of Systems  Unable to perform ROS   Allergies  Review of patient's allergies indicates no known allergies.  Home Medications   Current Outpatient Rx  Name Route Sig Dispense Refill  . BENZTROPINE MESYLATE 1 MG PO TABS Oral Take 1 tablet (1 mg total) by mouth 2 (two) times daily. 30 tablet 1  . DIVALPROEX SODIUM ER 500 MG PO TB24 Oral Take 1 tablet (500 mg total) by mouth daily. 30 tablet 0  . HALOPERIDOL 0.5 MG PO TABS Oral Take 1 tablet (0.5 mg total) by  mouth 2 (two) times daily. 30 tablet 1  . TRAZODONE HCL 50 MG PO TABS Oral Take 50 mg by mouth daily.        There were no vitals taken for this visit.  Physical Exam General: Well-developed, well-nourished male in no acute distress; appearance consistent with age of record; smells of poor hygiene HENT: normocephalic, atraumatic Eyes: pupils equal round and reactive to light; extraocular muscles intact Neck: supple Heart: regular rate and rhythm Lungs: clear to auscultation bilaterally Abdomen: soft; nondistended; nontender Extremities: No deformity; full range of motion Neurologic: Awake, alert; motor function intact in all extremities and symmetric; no facial droop Skin: Acneiform dermatosis of face and scalp Psychiatric: Depressed mood and affect; flight of ideas; erratic speech    ED Course  Procedures (including critical care time)    MDM   Nursing notes and vitals signs, including pulse oximetry, reviewed.  Summary of this visit's results, reviewed by myself:  Labs:  Results for orders placed during the hospital encounter of 03/17/11  CBC      Component Value Range   WBC 5.8  4.0 - 10.5 (K/uL)   RBC 5.11  4.22 - 5.81 (MIL/uL)   Hemoglobin 15.3  13.0 - 17.0 (g/dL)   HCT 40.9  81.1 - 91.4 (%)   MCV 86.9  78.0 - 100.0 (fL)   MCH 29.9  26.0 -  34.0 (pg)   MCHC 34.5  30.0 - 36.0 (g/dL)   RDW 16.1  09.6 - 04.5 (%)   Platelets 206  150 - 400 (K/uL)  COMPREHENSIVE METABOLIC PANEL      Component Value Range   Sodium 139  135 - 145 (mEq/L)   Potassium 3.6  3.5 - 5.1 (mEq/L)   Chloride 104  96 - 112 (mEq/L)   CO2 26  19 - 32 (mEq/L)   Glucose, Bld 112 (*) 70 - 99 (mg/dL)   BUN 12  6 - 23 (mg/dL)   Creatinine, Ser 4.09  0.50 - 1.35 (mg/dL)   Calcium 9.8  8.4 - 81.1 (mg/dL)   Total Protein 7.2  6.0 - 8.3 (g/dL)   Albumin 3.8  3.5 - 5.2 (g/dL)   AST 18  0 - 37 (U/L)   ALT 16  0 - 53 (U/L)   Alkaline Phosphatase 64  39 - 117 (U/L)   Total Bilirubin 0.3  0.3 - 1.2  (mg/dL)   GFR calc non Af Amer >90  >90 (mL/min)   GFR calc Af Amer >90  >90 (mL/min)  ETHANOL      Component Value Range   Alcohol, Ethyl (B) <11  0 - 11 (mg/dL)              Hanley Seamen, MD 03/17/11 0202  Carlisle Beers Maycie Luera, MD 03/17/11 9147

## 2011-03-17 NOTE — ED Provider Notes (Signed)
BP 106/76  Pulse 98  Temp(Src) 98.1 F (36.7 C) (Oral)  Resp 20  SpO2 100%  Telepsych reviewed. Recommends admission. Prolixin 5mg  now and daily.  Forbes Cellar, MD 03/17/11 1512

## 2011-03-17 NOTE — ED Notes (Signed)
Pt in for mental health issues, wants to be seen for depression, was being kicked out of hotel and called EMS

## 2011-03-17 NOTE — ED Notes (Signed)
Pt c/o hallucinations, states he is seeing things that aren't there, pt cussing at staff when attempting to draw blood, pt informed that he could cooperate with care and be respectful to staff or he would leave with [employer]

## 2011-03-17 NOTE — ED Notes (Signed)
Refused vitals 

## 2011-03-17 NOTE — BH Assessment (Signed)
Assessment Note   Erik [name] is an 36 y.o. male who presents voluntarily to [facility] via EMS. He was kicked out of hotel he was staying in so he called EMS. His chief complaint was depression. Pt reports SI with no plan. Pt is poor historian. His thoughts are disorganized and illogical. He appears internally distracted. Pt endorses auditory hallucinations in which voices tell him he "needs to be better". Twice during assessment, pt told Clinical research associate, "Please let me stay, I'm begging you.You want me dead". Speech incoherent at times. Pt states he doesn't want to talk to MD.  Pt's mood is "bad". His affect is blunted.    Axis I: Psychotic D/O NOS Axis II: Deferred Axis III:  Past Medical History  Diagnosis Date  . Schizoaffective disorder   . Depression   . Suicidal thoughts   . Anxiety    Axis IV: economic problems, housing problems and problems related to social environment Axis V: 31-40 impairment in reality testing  Past Medical History:  Past Medical History  Diagnosis Date  . Schizoaffective disorder   . Depression   . Suicidal thoughts   . Anxiety     History reviewed. No pertinent past surgical history.  Family History: History reviewed. No pertinent family history.  Social History:  reports that he has been smoking Cigarettes.  He has been smoking about .5 packs per day. He does not have any smokeless tobacco history on file. He reports that he does not drink alcohol or use illicit drugs.  Additional Social History:  Alcohol / Drug Use Pain Medications: unkn Prescriptions: unkn Over the Counter: unkn Longest period of sobriety (when/how long): unkn Allergies: No Known Allergies  Home Medications:  Medications Prior to Admission  Medication Dose Route Frequency Provider Last Rate Last Dose  . alum & mag hydroxide-simeth (MAALOX/MYLANTA) 200-200-20 MG/5ML suspension 30 mL  30 mL Oral PRN John L Molpus, MD      . LORazepam (ATIVAN) tablet 1 mg  1 mg Oral Q8H PRN John L Molpus,  MD      . ondansetron (ZOFRAN) tablet 4 mg  4 mg Oral Q8H PRN Carlisle Beers Molpus, MD       Medications Prior to Admission  Medication Sig Dispense Refill  . benztropine (COGENTIN) 1 MG tablet Take 1 tablet (1 mg total) by mouth 2 (two) times daily.  30 tablet  1  . divalproex (DEPAKOTE ER) 500 MG 24 hr tablet Take 1 tablet (500 mg total) by mouth daily.  30 tablet  0  . haloperidol (HALDOL) 0.5 MG tablet Take 1 tablet (0.5 mg total) by mouth 2 (two) times daily.  30 tablet  1  . traZODone (DESYREL) 50 MG tablet Take 50 mg by mouth daily.          OB/GYN Status:  No LMP for male patient.  General Assessment Data Location of Assessment: [facility] Living Arrangements: Homeless Can pt return to current living arrangement?: Yes Admission Status: Voluntary Is patient capable of signing voluntary admission?: Yes Transfer from: Acute Hospital Referral Source:  (ems)  Education Status Is patient currently in school?: No  Risk to self Suicidal Ideation: Yes-Currently Present Suicidal Intent: Yes-Currently Present Is patient at risk for suicide?: Yes Suicidal Plan?: No Access to Means: No What has been your use of drugs/alcohol within the last 12 months?: unknown Substance abuse history and/or treatment for substance abuse?: Yes  Risk to Others Homicidal Ideation: No Thoughts of Harm to Others: No Current Homicidal Intent: No  Psychosis Hallucinations: Auditory  Mental Status Report Appear/Hygiene: Disheveled Eye Contact: Poor Motor Activity: Freedom of movement Speech: Incoherent Level of Consciousness: Alert Affect: Blunted Thought Processes: Irrelevant Judgement: Impaired Orientation: Person;Place  Cognitive Functioning Insight: Poor Appetite: Poor  Prior Inpatient Therapy Prior Inpatient Therapy: Yes Prior Therapy Dates: 2012 Prior Therapy Facilty/Provider(s): [facility] - multiple stays in 2012 Reason for Treatment: psychosis  Prior Outpatient Therapy Prior Outpatient  Therapy: Yes Prior Therapy Dates: unknown [facility] Reason for Treatment: med management                     Additional Information 1:1 In Past 12 Months?: No CIRT Risk: No Elopement Risk: No Does patient have medical clearance?: Yes     Disposition:  Disposition Disposition of Patient: Outpatient treatment;Other dispositions Type of outpatient treatment: Adult  On Site Evaluation by:   Reviewed with Physician:     Donnamarie Rossetti P 03/17/2011 5:57 AM

## 2011-03-17 NOTE — ED Notes (Signed)
Pt. Given all D/C papers and 1 prescription for medication.  Pt. Verbalized understanding.  Signed D/C paper, given all (4) bags of belongings.  Pt. Requested to use phone.

## 2011-03-17 NOTE — ED Notes (Signed)
Pt. Given sandwich/drink per request.  ACT team provided pt. With list of homeless shelter/phone numbers.  Pt. Spent 15 min. Making calls.

## 2011-03-17 NOTE — ED Notes (Signed)
[facility] Psych consult completed by Dr. Reece Leader.

## 2011-03-17 NOTE — ED Provider Notes (Signed)
4:27 PM The patient has been evaluated by the psychiatrist and recommendation for dc home with outpatient tx. He stressed medication compliance with this pt. The pt was given prolixin 5mg  now and everyday  Outpatient resources given to the pt regarding homeless shelters. It is 430 in the afternoon and sunny outside.   Lyanne Co, MD 03/17/11 [phone]

## 2011-03-17 NOTE — BH Assessment (Cosign Needed)
Per pt's nurse-Jennifer, pt to be discharged home based on the telepsych consult. The report from the telepsychiatrist indicates that pt is able to be discharged home. Writer provided the EDP with the documentation from the telepsychiatrist. Patient is pending discharge from the EDP. Patient given various out-pt referrals to mental health providers including psychiatrist, therapist, mobile crises workers, support groups, and the local mental health. Writer also provided patient with a list of homeless shelter referrals. Patient offered a bus and sts, "I don't need one".

## 2011-03-17 NOTE — ED Notes (Signed)
Pt had 4 bags of belongings.

## 2011-03-17 NOTE — ED Provider Notes (Signed)
BP 123/97  Pulse 94  Temp 97.3 F (36.3 C)  Resp 18  SpO2 100%  Patient seen and evaluated by me. No complaints at this time.   Forbes Cellar, MD 03/17/11 [phone]

## 2011-03-18 ENCOUNTER — Ambulatory Visit (HOSPITAL_COMMUNITY)
Admission: RE | Admit: 2011-03-18 | Discharge: 2011-03-18 | Disposition: A | Discharge status: Home / Self Care | Payer: Medicaid Other | Source: Ambulatory Visit | Attending: Psychiatry | Admitting: Psychiatry

## 2011-03-18 ENCOUNTER — Emergency Department (HOSPITAL_COMMUNITY)
Admission: EM | Admit: 2011-03-18 | Discharge: 2011-03-18 | Disposition: A | Discharge status: Home / Self Care | Payer: Medicaid Other | Source: Home / Self Care | Attending: Emergency Medicine | Admitting: Emergency Medicine

## 2011-03-18 ENCOUNTER — Encounter (HOSPITAL_COMMUNITY): Payer: Self-pay | Admitting: Emergency Medicine

## 2011-03-18 DIAGNOSIS — Z5900 Homelessness unspecified: Secondary | ICD-10-CM | POA: Insufficient documentation

## 2011-03-18 DIAGNOSIS — F209 Schizophrenia, unspecified: Secondary | ICD-10-CM | POA: Insufficient documentation

## 2011-03-18 LAB — CBC
HCT: 44.4 % (ref 39.0–52.0)
Hemoglobin: 15.1 g/dL (ref 13.0–17.0)
MCH: 29.5 pg (ref 26.0–34.0)
MCHC: 34 g/dL (ref 30.0–36.0)
MCV: 86.7 fL (ref 78.0–100.0)
Platelets: 207 10*3/uL (ref 150–400)
RBC: 5.12 MIL/uL (ref 4.22–5.81)
RDW: 13.7 % (ref 11.5–15.5)
WBC: 7.1 10*3/uL (ref 4.0–10.5)

## 2011-03-18 LAB — COMPREHENSIVE METABOLIC PANEL
ALT: 15 U/L (ref 0–53)
AST: 21 U/L (ref 0–37)
Albumin: 3.9 g/dL (ref 3.5–5.2)
Alkaline Phosphatase: 62 U/L (ref 39–117)
BUN: 17 mg/dL (ref 6–23)
CO2: 27 mEq/L (ref 19–32)
Calcium: 9.6 mg/dL (ref 8.4–10.5)
Chloride: 101 mEq/L (ref 96–112)
Creatinine, Ser: 1.14 mg/dL (ref 0.50–1.35)
GFR calc Af Amer: >90 mL/min (ref >90)
GFR calc non Af Amer: 80 mL/min — ABNORMAL LOW (ref >90)
Glucose, Bld: 96 mg/dL (ref 70–99)
Potassium: 3.9 mEq/L (ref 3.5–5.1)
Sodium: 138 mEq/L (ref 135–145)
Total Bilirubin: 0.3 mg/dL (ref 0.3–1.2)
Total Protein: 7.4 g/dL (ref 6.0–8.3)

## 2011-03-18 LAB — ETHANOL: Alcohol, Ethyl (B): <11 mg/dL (ref 0–11)

## 2011-03-18 LAB — RAPID URINE DRUG SCREEN, HOSP PERFORMED
Amphetamines: NOT DETECTED
Barbiturates: NOT DETECTED
Benzodiazepines: NOT DETECTED
Cocaine: NOT DETECTED
Opiates: NOT DETECTED
Tetrahydrocannabinol: NOT DETECTED

## 2011-03-18 LAB — ACETAMINOPHEN LEVEL: Acetaminophen (Tylenol), Serum: <15 ug/mL (ref 10–30)

## 2011-03-18 NOTE — ED Notes (Signed)
Pt reports "I'm feeling suicidal" "I'm hearing voices" "...like someone's gonna kill me or something." Pt stares at staff and masturbates with hand in pants; redirects easily, currently calm and cooperative.

## 2011-03-18 NOTE — ED Notes (Signed)
Pt's belongings placed in locker #37

## 2011-03-18 NOTE — ED Provider Notes (Signed)
History    36 year old male presenting because "I'm schizophrenic man.". Patient was just evaluated in emergency room department yesterday including including psychiatric evaluation. Patient was given resources which has yet to followup with. Patient states that he is hearing voices and he is scared that he might get hurt. Patient states that he hears voices but does not describe command hallucinations. Denies acute intoxication. When asked why he came back to ED he has difficulty providing coherent reason. When specifically asked about if has SI, HI, abusing drugs, needs a place to stay, etc. He replies "Yeah, I need all of that."   CSN: [id number]  Arrival date & time 03/18/11  5621   First MD Initiated Contact with Patient 03/18/11 0703      Chief Complaint  Patient presents with  . Suicidal  . Hallucinations    (Consider location/radiation/quality/duration/timing/severity/associated sxs/prior treatment) HPI  Past Medical History  Diagnosis Date  . Schizoaffective disorder   . Depression   . Suicidal thoughts   . Anxiety     History reviewed. No pertinent past surgical history.  History reviewed. No pertinent family history.  History  Substance Use Topics  . Smoking status: Current Everyday Smoker -- 0.5 packs/day    Types: Cigarettes  . Smokeless tobacco: Not on file  . Alcohol Use: No     social/rare      Review of Systems   Review of symptoms negative unless otherwise noted in HPI.   Allergies  Review of patient's allergies indicates no known allergies.  Home Medications   Current Outpatient Rx  Name Route Sig Dispense Refill  . BENZTROPINE MESYLATE 1 MG PO TABS Oral Take 1 tablet (1 mg total) by mouth 2 (two) times daily. 30 tablet 1  . DIVALPROEX SODIUM ER 500 MG PO TB24 Oral Take 1 tablet (500 mg total) by mouth daily. 30 tablet 0  . FLUPHENAZINE HCL 5 MG PO TABS Oral Take 1 tablet (5 mg total) by mouth daily. 30 tablet 1  . HALOPERIDOL 0.5 MG PO  TABS Oral Take 1 tablet (0.5 mg total) by mouth 2 (two) times daily. 30 tablet 1  . TRAZODONE HCL 50 MG PO TABS Oral Take 50 mg by mouth daily.        BP 104/88  Pulse 95  Temp(Src) 98.4 F (36.9 C) (Oral)  Resp 18  SpO2 97%  Physical Exam  Nursing note and vitals reviewed. Constitutional: He appears well-developed and well-nourished. No distress.  HENT:  Head: Normocephalic and atraumatic.  Eyes: Conjunctivae are normal. Pupils are equal, round, and reactive to light. Right eye exhibits no discharge. Left eye exhibits no discharge. No scleral icterus.  Neck: Normal range of motion. Neck supple.  Cardiovascular: Normal rate, regular rhythm and normal heart sounds.  Exam reveals no gallop and no friction rub.   No murmur heard. Pulmonary/Chest: Effort normal and breath sounds normal. No respiratory distress.  Abdominal: Soft. He exhibits no distension. There is no tenderness.  Musculoskeletal: He exhibits no edema and no tenderness.  Neurological: He is alert.       Oriented to place and self. Could tell me it was 2013.  Skin: Skin is warm and dry. He is not diaphoretic.  Psychiatric: Thought content normal.       Somewhat rambling speech. Does not appear to be responding to internal stimuli. Poor insight. Poor judgment.    ED Course  Procedures (including critical care time)  Labs Reviewed  COMPREHENSIVE METABOLIC PANEL - Abnormal; Notable for  the following:    GFR calc non Af Amer 80 (*)    All other components within normal limits  CBC  ETHANOL  ACETAMINOPHEN LEVEL  URINE RAPID DRUG SCREEN (HOSP PERFORMED)   No results found.   1. Homelessness   2. Schizophrenia       MDM  36yM with repeat visit. Pt with hx of psychiatric illness but does not appear to be acutely psychotic at this time. Answers affirmative to every question as to why he returned to ED but can't/won't provide specifics beyond this.  Pt was just evaluated in ED yesterday and discharged. Resources  provided. Pt does can't provide reason for acute change as to why returned to ED. Suspect secondary gain. With no history provided as to why may need repeat psychiatric evaluation when just evaluated will DC again with resources.        Raeford Razor, MD 03/18/11 [phone]

## 2011-03-18 NOTE — Discharge Instructions (Signed)
Schizophrenia Schizophrenia is a serious mental illness. There is disturbed and disorganized thinking, language, and behavior. Patients may see, hear, or feel things that are not really there. Sometimes speech is incoherent and there are multiple problems with day to day living. Schizophrenia should not be confused with multiple personality disorder (now called dissociative identity disorder) in which a person has at least two distinct personalities. About 1% of people have schizophrenia in their lifetimes. It affects men and women equally. CAUSES  There are many theories about the cause of schizophrenia. The genes a person inherits from his or her parents may be partially responsible. Stress in a person's environment can trigger episodes. A person may have functioned normally for years and then have an acute (sudden) psychotic episode caused by stress. Some scientists believe that something might happen before birth, such as a viral infection in the womb, that causes schizophrenic symptoms (problems) decades later. Special scans, such as PET (positron-emission tomography) have been used to look at the brains of people with this illness. These pictures show that some parts of the brain seem to have metabolic or chemical abnormalities. Lab studies have shown that nerve cells in some parts of the brains of schizophrenics may be uneven or damaged. Another possible cause is that chemicals carrying signals between nerve cells may not be working. Schizophrenia does not appear to be caused by family problems. Stress does appear to make things worse for people with this illness. SYMPTOMS No single symptom defines this condition. Important signs are:  Hallucinations (hearing voices or seeing things that are not there to hear or see).   Dressing inappropriately.   Neglecting personal hygiene and grooming.   Withdrawing from social contacts and not speaking to anybody (autism).   Inability to understand what a  schizophrenic is saying.   A growing distrust of people without good cause.   Being very bland or blunted emotionally (flat affect).  TYPES OF SCHIZOPHRENIA  Paranoid schizophrenia involves delusional thoughts. The patient believes people around them are against them and plotting against them.   In grandiose schizophrenia the patient may feel that he is God, or the President, etc.   Disorganized schizophrenia involves symptoms of disorganized speech.  TREATMENT  Medications are the most important part of the treatment of schizophrenia. Many medications are available that can relieve symptoms. It is often helpful if these can be administered by a more trusted family member because the patient may sometimes think they are being poisoned. It is important that the medications be given regularly even when the patient seems to be doing well. Do not stop giving medications without instruction by a caregiver. This could lead to a relapse. Hospitalization may sometimes be necessary if symptoms cannot be controlled with medications. Schizophrenia may be lifelong. However, periods of illness may be inter spaced with long periods of normality. Medications can greatly improve the quality of life. Non prescribed drugs and alcohol should be avoided.  Assistance is available for care. The [employer] for the [employer]ally Ill is an organization of family members of people with severe mental illness. They direct families and patients to support groups, education, and advocacy programs for additional help. [employer] for the [employer]ally Ill) toll-free help line number is 800/950-NAMI, or 309-486-5500. Document Released: 12/18/1999 Document Revised: 12/09/2010 Document Reviewed: 12/20/2004 [facility] Patient Information 2012 [place], [place].  RESOURCE GUIDE  Dental Problems  Patients with Medicaid: [facility]  [facility] [address]                                            [phone] [address] Phone:  [phone]                                                  Phone:  [phone]  If unable to pay or uninsured, contact:  [facility] or [facility] to become qualified for the adult dental clinic.  Chronic Pain Problems Contact [facility]  [phone] Patients need to be referred by their primary care doctor.  Insufficient Money for Medicine Contact United Way:  call "211" or [facility] 204-630-5472.  No Primary Care Doctor Call [facility]  [phone] Other agencies that provide inexpensive medical care    [facility]  [phone]    [facility]  [phone]    [facility]  [phone]    [facility]  [phone]    [facility]  [phone]    [facility]  [phone]  Psychological Services [facility]  [phone] [facility] Services  [phone] [facility]   [phone] (emergency services ([phone]) [phone])  Substance Abuse Resources [facility] Drug Services  [phone] [facility] ([phone] [facility] [place] [phone] Floydene Flock [phone] Residential & Outpatient Substance Abuse Program  [phone]  Abuse/Neglect [facility] Child Abuse Hotline 941-402-3914 [facility] Child Abuse Hotline 779-116-8181 (After Hours)  Emergency Shelter [facility] 5731287066  Maternity Homes Room at the [place] [facility] 9785817313 [facility] [phone]  MRSA Hotline #:   (810) 845-4882    [facility] Resources  [facility]     United [employer]                          [facility] Dept. [address] [employer]                       [address]      [address]  Blondell Reveal Phone:  [phone]                                    Phone:  [phone]                 Phone:  [phone]  [facility] Phone:  [phone]  [facility] Child Abuse Hotline [phone] [phone] (After Hours)

## 2011-03-18 NOTE — ED Notes (Signed)
Pt refuses to answer questions, only states "I bought a house but can't get to it"

## 2011-03-19 ENCOUNTER — Encounter (HOSPITAL_COMMUNITY): Payer: Self-pay | Admitting: Licensed Clinical Social Worker

## 2011-03-19 ENCOUNTER — Emergency Department (HOSPITAL_COMMUNITY)
Admission: EM | Admit: 2011-03-19 | Discharge: 2011-03-20 | Disposition: A | Discharge status: Other Acute Inpatient Hospital | Payer: Medicaid Other | Source: Home / Self Care | Attending: Emergency Medicine | Admitting: Emergency Medicine

## 2011-03-19 ENCOUNTER — Encounter (HOSPITAL_COMMUNITY): Payer: Self-pay | Admitting: *Deleted

## 2011-03-19 DIAGNOSIS — R45851 Suicidal ideations: Secondary | ICD-10-CM | POA: Insufficient documentation

## 2011-03-19 LAB — RAPID URINE DRUG SCREEN, HOSP PERFORMED
Amphetamines: NOT DETECTED
Barbiturates: NOT DETECTED
Benzodiazepines: NOT DETECTED
Tetrahydrocannabinol: NOT DETECTED

## 2011-03-19 LAB — COMPREHENSIVE METABOLIC PANEL
ALT: 13 U/L (ref 0–53)
Albumin: 3.7 g/dL (ref 3.5–5.2)
Alkaline Phosphatase: 58 U/L (ref 39–117)
Potassium: 3.9 mEq/L (ref 3.5–5.1)
Sodium: 138 mEq/L (ref 135–145)
Total Protein: 7.3 g/dL (ref 6.0–8.3)

## 2011-03-19 LAB — CBC
MCH: 29.5 pg (ref 26.0–34.0)
MCHC: 34 g/dL (ref 30.0–36.0)
Platelets: 194 10*3/uL (ref 150–400)
RBC: 4.88 MIL/uL (ref 4.22–5.81)

## 2011-03-19 LAB — DIFFERENTIAL
Basophils Relative: 1 % (ref 0–1)
Eosinophils Absolute: 0.2 10*3/uL (ref 0.0–0.7)
Lymphs Abs: 2.6 10*3/uL (ref 0.7–4.0)
Neutrophils Relative %: 39 % — ABNORMAL LOW (ref 43–77)

## 2011-03-19 LAB — VALPROIC ACID LEVEL: Valproic Acid Lvl: <10 ug/mL — ABNORMAL LOW (ref 50.0–100.0)

## 2011-03-19 MED ORDER — LORAZEPAM 1 MG PO TABS: 1.0000 mg | ORAL_TABLET | Freq: Three times a day (TID) | ORAL | Status: DC | PRN

## 2011-03-19 MED ORDER — DIVALPROEX SODIUM ER 500 MG PO TB24
500.0000 mg | ORAL_TABLET | Freq: Every day | ORAL | Status: DC
  Administered 2011-03-19: 500 mg via ORAL
  Filled 2011-03-19 (×2): qty 1

## 2011-03-19 MED ORDER — ALUM & MAG HYDROXIDE-SIMETH 200-200-20 MG/5ML PO SUSP: 30.0000 mL | ORAL | Status: DC | PRN

## 2011-03-19 MED ORDER — ACETAMINOPHEN 325 MG PO TABS: 650.0000 mg | ORAL_TABLET | ORAL | Status: DC | PRN

## 2011-03-19 MED ORDER — ZOLPIDEM TARTRATE 5 MG PO TABS: 5.0000 mg | ORAL_TABLET | Freq: Every evening | ORAL | Status: DC | PRN

## 2011-03-19 MED ORDER — HALOPERIDOL 1 MG PO TABS
0.5000 mg | ORAL_TABLET | Freq: Two times a day (BID) | ORAL | Status: DC
  Administered 2011-03-19 (×2): 0.5 mg via ORAL
  Filled 2011-03-19 (×3): qty 1

## 2011-03-19 MED ORDER — FLUPHENAZINE HCL 5 MG PO TABS
5.0000 mg | ORAL_TABLET | Freq: Two times a day (BID) | ORAL | Status: DC
  Administered 2011-03-19: 5 mg via ORAL
  Filled 2011-03-19 (×3): qty 1

## 2011-03-19 MED ORDER — BENZTROPINE MESYLATE 1 MG PO TABS
1.0000 mg | ORAL_TABLET | Freq: Two times a day (BID) | ORAL | Status: DC
  Administered 2011-03-19 (×2): 1 mg via ORAL
  Filled 2011-03-19 (×2): qty 1

## 2011-03-19 MED ORDER — IBUPROFEN 200 MG PO TABS: 600.0000 mg | ORAL_TABLET | Freq: Three times a day (TID) | ORAL | Status: DC | PRN

## 2011-03-19 MED ORDER — ONDANSETRON HCL 4 MG PO TABS: 4.0000 mg | ORAL_TABLET | Freq: Three times a day (TID) | ORAL | Status: DC | PRN

## 2011-03-19 NOTE — ED Notes (Signed)
Tele-psy consult re-call to follow up on the psy consult. Staff from [facility] sts they will start a new request for [place]. Not waiting for psychiatrist to call me back.

## 2011-03-19 NOTE — ED Notes (Signed)
Report received from night shift nurse. First contact with pt. Pt's ABCs intact. NAD. Will continue to monitor.

## 2011-03-19 NOTE — ED Notes (Signed)
Pt given turkey sandwich and graham crackers 

## 2011-03-19 NOTE — ED Notes (Signed)
Pt presents for med clearance from [facility] stating that he "feels stressful pains all over his body and is suicidal".  Pt is mumbling to himself.

## 2011-03-19 NOTE — ED Notes (Signed)
Pt requesting meal tray.  Pt was given sandwich at 5:30 per documentation.  Pt argumentative and demanding dinner tray.  Explained cannot get tray at this time.  Pt offered snack and refused.

## 2011-03-19 NOTE — ED Provider Notes (Signed)
History     CSN: [id number]  Arrival date & time 03/19/11  0011   First MD Initiated Contact with Patient 03/19/11 0041      Chief Complaint  Patient presents with  . Medical Clearance    (Consider location/radiation/quality/duration/timing/severity/associated sxs/prior treatment) HPI Patient is a 36 year old male who has been seen multiple times in the past few days. He presents today complaining of suicidal ideation. Patient has history of schizophrenia. He does not have a distinct plan to hurt himself. He denies wanting to hurt others. Patient denies hallucinations both auditory and visual. He denies any chest pain, shortness breath, fevers, abdominal pain, or other concerning symptoms. There are no other associated or modifying factors. Past Medical History  Diagnosis Date  . Schizoaffective disorder   . Depression   . Suicidal thoughts   . Anxiety     Past Surgical History  Procedure Date  . No past surgeries     No family history on file.  History  Substance Use Topics  . Smoking status: Current Everyday Smoker -- 0.5 packs/day    Types: Cigarettes  . Smokeless tobacco: Not on file  . Alcohol Use: No     social/rare      Review of Systems  Constitutional: Negative.   HENT: Negative.   Eyes: Negative.   Respiratory: Negative.   Cardiovascular: Negative.   Gastrointestinal: Negative.   Genitourinary: Negative.   Musculoskeletal: Negative.   Skin: Negative.   Neurological: Negative.   Hematological: Negative.   Psychiatric/Behavioral: Positive for suicidal ideas.  All other systems reviewed and are negative.    Allergies  Review of patient's allergies indicates no known allergies.  Home Medications   Current Outpatient Rx  Name Route Sig Dispense Refill  . BENZTROPINE MESYLATE 1 MG PO TABS Oral Take 1 tablet (1 mg total) by mouth 2 (two) times daily. 30 tablet 1  . DIVALPROEX SODIUM ER 500 MG PO TB24 Oral Take 1 tablet (500 mg total) by mouth  daily. 30 tablet 0  . FLUPHENAZINE HCL 5 MG PO TABS Oral Take 1 tablet (5 mg total) by mouth daily. 30 tablet 1  . HALOPERIDOL 0.5 MG PO TABS Oral Take 1 tablet (0.5 mg total) by mouth 2 (two) times daily. 30 tablet 1    BP 97/81  Pulse 99  Temp(Src) 97.8 F (36.6 C) (Oral)  Resp 16  Ht 6\' 1"  (1.854 m)  Wt 200 lb (90.719 kg)  BMI 26.39 kg/m2  SpO2 99%  Physical Exam  Nursing note and vitals reviewed. GEN: Well-developed, well-nourished male in no distress, poor hygiene HEENT: Atraumatic, normocephalic. Oropharynx clear without erythema EYES: PERRLA BL, no scleral icterus. NECK: Trachea midline, no meningismus CV: regular rate and rhythm. No murmurs, rubs, or gallops PULM: No respiratory distress.  No crackles, wheezes, or rales. GI: soft, non-tender. No guarding, rebound, or tenderness. + bowel sounds  GU: deferred Neuro: cranial nerves 2-12 intact, no abnormalities of strength or sensation, A and O x 3 MSK: Patient moves all 4 extremities symmetrically, no deformity, edema, or injury noted Skin: No rashes petechiae, purpura, or jaundice Psych: Complains of suicidality ideation without a plan.  ED Course  Procedures (including critical care time)  Labs Reviewed  DIFFERENTIAL - Abnormal; Notable for the following:    Neutrophils Relative 39 (*)    Lymphocytes Relative 47 (*)    All other components within normal limits  COMPREHENSIVE METABOLIC PANEL - Abnormal; Notable for the following:    Glucose, Bld 102 (*)  All other components within normal limits  CBC  ETHANOL  URINE RAPID DRUG SCREEN (HOSP PERFORMED)   No results found.   1. Suicidal ideation       MDM  Patient was evaluated by myself and was medically clear. Patient complained of suicidal ideation. Given his numerous presentations patient was evaluated by the act team but a telepsych consult was also ordered. This is pending at this time. Holding orders were placed. Patient awaits final  position.        Cyndra Numbers, MD 03/19/11 [phone]

## 2011-03-19 NOTE — BH Assessment (Signed)
Assessment Note   Consulted with Dr. Harvie Heck Readling who said Pt should be transferred to [facility] for medical clearance and tele- psychiatry. Discussed recommendation with Pt who agrees to transfer to [facility] and also understands he has not been accepted to [facility] or declined and that final recommendation has not been decided. Evonnie Dawes, RN at [facility] and gave report. Pt was transported to [facility] via security and [facility] staff.   Patsy Baltimore, Harlin Rain 03/19/2011 12:45 AM

## 2011-03-19 NOTE — ED Notes (Signed)
Pt sent from the [facility], MD requested medical clearance and tele psych, pt does not have a bed holding at [facility] at this time.

## 2011-03-19 NOTE — ED Notes (Signed)
Called to get telepsych conference awaiting call back from MD>

## 2011-03-19 NOTE — ED Provider Notes (Signed)
Patient seen and examined this morning. He has no complaints or concerns at this time.  Filed Vitals:   03/19/11 0031 03/19/11 0804  BP: 97/81 110/70  Pulse: 99 95  Temp: 97.8 F (36.6 C) 97.8 F (36.6 C)  TempSrc: Oral Oral  Resp: 16 16  Height: 6\' 1"  (1.854 m)   Weight: 200 lb (90.719 kg)   SpO2: 99% 99%    Heart: Regular rate and rhythm Lungs clear to auscultation  Per medical reconciliation patient is on Depakote. We will check a level this morning.  Celene Kras, MD 03/19/11 [phone]

## 2011-03-19 NOTE — BH Assessment (Signed)
Assessment Note   Erik [name] is an 36 y.o. male, single, African-American who presents to [facility] requesting inpatient treatment "to get back on my mediations." Pt has a long history of schizoaffective disorder and multiple ED visits. Pt was seen both yesterday and today in ED and released both times. Pt is oriented x4 but has tangential thought process and difficulty answering questions appropriately. 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 He is currently homeless and has no support system. He when asked if he was having suicidal ideation he stated yes and that he has a history of a suicide attempt by overdosing. He denies homicidal ideation or history of violence. He denies auditory or visual hallucinations but appears at times to be responding to internal stimuli. He denies any alcohol or substance abuse. When asked he states he feels depressed with crying spells, poor sleep, poor appetite and feelings of hopelessness and helplessness. He states "my heart hurts in my chest when I get this way." He presents as disheveled and malodorous with what appears to be vomit on his clothing.    Axis I: Schizoaffective Disorder Axis II: Deferred Axis III:  Past Medical History  Diagnosis Date  . Schizoaffective disorder   . Depression   . Suicidal thoughts   . Anxiety    Axis IV: economic problems, housing problems, problems with access to health care services and problems with primary support group Axis V: GAF= 25  Past Medical History:  Past Medical History  Diagnosis Date  . Schizoaffective disorder   . Depression   . Suicidal thoughts   . Anxiety     Past Surgical History  Procedure Date  . No past surgeries     Family History: No family history on file.  Social History:  reports that he has been smoking Cigarettes.  He has been smoking about .5 packs per day. He does not have any smokeless tobacco  history on file. He reports that he does not drink alcohol or use illicit drugs.  Additional Social History:  Alcohol / Drug Use Pain Medications: Denies Prescriptions: Denies Over the Counter: Denies History of alcohol / drug use?: No history of alcohol / drug abuse Allergies: No Known Allergies  Home Medications:  Medications Prior to Admission  Medication Dose Route Frequency Provider Last Rate Last Dose  . DISCONTD: alum & mag hydroxide-simeth (MAALOX/MYLANTA) 200-200-20 MG/5ML suspension 30 mL  30 mL Oral PRN Carlisle Beers Molpus, MD      . DISCONTD: fluPHENAZine (PROLIXIN) tablet 5 mg  5 mg Oral Daily Forbes Cellar, MD   5 mg at 03/17/11 1532  . DISCONTD: LORazepam (ATIVAN) tablet 1 mg  1 mg Oral Q8H PRN Carlisle Beers Molpus, MD   1 mg at 03/17/11 1443  . DISCONTD: ondansetron (ZOFRAN) tablet 4 mg  4 mg Oral Q8H PRN Hanley Seamen, MD       Medications Prior to Admission  Medication Sig Dispense Refill  . benztropine (COGENTIN) 1 MG tablet Take 1 tablet (1 mg total) by mouth 2 (two) times daily.  30 tablet  1  . divalproex (DEPAKOTE ER) 500 MG 24 hr tablet Take 1 tablet (500 mg total) by mouth daily.  30 tablet  0  . fluPHENAZine (PROLIXIN) 5 MG tablet Take 1 tablet (5 mg total) by mouth daily.  30 tablet  1  . haloperidol (HALDOL) 0.5 MG tablet Take 1 tablet (0.5  mg total) by mouth 2 (two) times daily.  30 tablet  1  . traZODone (DESYREL) 50 MG tablet Take 50 mg by mouth daily.          OB/GYN Status:  No LMP for male patient.  General Assessment Data Location of Assessment: [facility] Living Arrangements: Homeless Can pt return to current living arrangement?: Yes Admission Status: Voluntary Is patient capable of signing voluntary admission?: Yes Transfer from: Home Referral Source: Self/Family/Friend  Education Status Is patient currently in school?: No  Risk to self Suicidal Ideation: Yes-Currently Present Suicidal Intent: Yes-Currently Present Is patient at risk  for suicide?: Yes Suicidal Plan?: Yes-Currently Present Specify Current Suicidal Plan: Pt reports thoughts of overdosing on medication Access to Means: No What has been your use of drugs/alcohol within the last 12 months?: Pt denies alcohol or drug use Previous Attempts/Gestures: Yes How many times?: 1  Other Self Harm Risks: None Triggers for Past Attempts: Other (Comment) (Psychotic symptoms) Intentional Self Injurious Behavior: None Family Suicide History: No Recent stressful life event(s): Financial Problems;Other (Comment) (Homeless, off psychiatric medications) Persecutory voices/beliefs?: No Depression: Yes Depression Symptoms: Despondent;Insomnia;Fatigue Substance abuse history and/or treatment for substance abuse?: No Suicide prevention information given to non-admitted patients: Not applicable  Risk to Others Homicidal Ideation: No Thoughts of Harm to Others: No Current Homicidal Intent: No Current Homicidal Plan: No Access to Homicidal Means: No Identified Victim: None History of harm to others?: No Assessment of Violence: None Noted Violent Behavior Description: Pt is cooperative and denies history of violence. Does patient have access to weapons?: No Criminal Charges Pending?: No Does patient have a court date: No  Psychosis Hallucinations: Auditory Delusions: Unspecified  Mental Status Report Appear/Hygiene: Body odor;Poor hygiene;Disheveled Eye Contact: Good Motor Activity: Restlessness Speech: Tangential Level of Consciousness: Alert Mood: Apprehensive;Helpless Affect: Anxious Anxiety Level: Moderate Thought Processes: Tangential;Irrelevant Judgement: Impaired Orientation: Person;Place;Time;Situation Obsessive Compulsive Thoughts/Behaviors: None  Cognitive Functioning Concentration: Decreased Memory: Recent Intact;Remote Intact IQ: Average Insight: Poor Impulse Control: Fair Appetite: Poor Weight Loss: 0  Weight Gain: 0  Sleep:  Decreased Total Hours of Sleep: 5  Vegetative Symptoms: Decreased grooming  Prior Inpatient Therapy Prior Inpatient Therapy: Yes Prior Therapy Dates: 09/2010 Prior Therapy Facilty/Provider(s): [facility] - multiple stays in 2012 Reason for Treatment: psychosis  Prior Outpatient Therapy Prior Outpatient Therapy: Yes Prior Therapy Dates: 2012 [facility] Reason for Treatment: med management  ADL Screening (condition at time of admission) Patient's cognitive ability adequate to safely complete daily activities?: Yes Patient able to express need for assistance with ADLs?: Yes Independently performs ADLs?: Yes Weakness of Legs: None Weakness of Arms/Hands: None  Home Assistive Devices/Equipment Home Assistive Devices/Equipment: None    Abuse/Neglect Assessment (Assessment to be complete while patient is alone) Physical Abuse: Denies Verbal Abuse: Denies Sexual Abuse: Denies Exploitation of patient/patient's resources: Denies Self-Neglect: Denies       Nutrition Screen Diet: Regular Unintentional weight loss greater than 10lbs within the last month: No Problems chewing or swallowing foods and/or liquids: No Home Tube Feeding or Total Parenteral Nutrition (TPN): No Patient appears severely malnourished: No  Additional Information 1:1 In Past 12 Months?: No CIRT Risk: No Elopement Risk: No Does patient have medical clearance?: No     Disposition:  Disposition Disposition of Patient: Other dispositions (WLED for medical clearance) Other disposition(s): Other (Comment) (WLED for medical clearance) Patient referred to: Other (Comment) (WLED for medical clearance)  Consulted with Dr. Allena Katz who said Pt should be transferred to [facility] for  medical clearance and further evaluation.  On Site Evaluation by:   Reviewed with Physician: Franchot Gallo, MD    Patsy Baltimore, Harlin Rain 03/19/2011 12:30 AM

## 2011-03-20 ENCOUNTER — Encounter (HOSPITAL_COMMUNITY): Payer: Self-pay | Admitting: *Deleted

## 2011-03-20 ENCOUNTER — Inpatient Hospital Stay (HOSPITAL_COMMUNITY)
Admission: AD | Admit: 2011-03-20 | Discharge: 2011-04-05 | DRG: 885 | Disposition: A | Discharge status: Home / Self Care | Payer: Medicaid Other | Attending: Psychiatry | Admitting: Psychiatry

## 2011-03-20 DIAGNOSIS — F200 Paranoid schizophrenia: Secondary | ICD-10-CM

## 2011-03-20 DIAGNOSIS — F17200 Nicotine dependence, unspecified, uncomplicated: Secondary | ICD-10-CM

## 2011-03-20 DIAGNOSIS — F3289 Other specified depressive episodes: Secondary | ICD-10-CM

## 2011-03-20 DIAGNOSIS — F259 Schizoaffective disorder, unspecified: Secondary | ICD-10-CM

## 2011-03-20 DIAGNOSIS — Z9119 Patient's noncompliance with other medical treatment and regimen: Secondary | ICD-10-CM

## 2011-03-20 DIAGNOSIS — Z91199 Patient's noncompliance with other medical treatment and regimen due to unspecified reason: Secondary | ICD-10-CM

## 2011-03-20 DIAGNOSIS — Z79899 Other long term (current) drug therapy: Secondary | ICD-10-CM

## 2011-03-20 DIAGNOSIS — Z5900 Homelessness unspecified: Secondary | ICD-10-CM

## 2011-03-20 DIAGNOSIS — F411 Generalized anxiety disorder: Secondary | ICD-10-CM

## 2011-03-20 DIAGNOSIS — F329 Major depressive disorder, single episode, unspecified: Secondary | ICD-10-CM

## 2011-03-20 MED ORDER — MAGNESIUM HYDROXIDE 400 MG/5ML PO SUSP: 30.0000 mL | Freq: Every day | ORAL | Status: DC | PRN

## 2011-03-20 MED ORDER — ACETAMINOPHEN 325 MG PO TABS
650.0000 mg | ORAL_TABLET | Freq: Four times a day (QID) | ORAL | Status: DC | PRN
  Administered 2011-03-20 – 2011-03-28 (×3): 650 mg via ORAL

## 2011-03-20 MED ORDER — ALUM & MAG HYDROXIDE-SIMETH 200-200-20 MG/5ML PO SUSP
30.0000 mL | ORAL | Status: DC | PRN
  Administered 2011-03-28: 30 mL via ORAL

## 2011-03-20 MED ORDER — BENZTROPINE MESYLATE 1 MG PO TABS
1.0000 mg | ORAL_TABLET | Freq: Two times a day (BID) | ORAL | Status: DC
  Administered 2011-03-20 – 2011-03-21 (×2): 1 mg via ORAL
  Filled 2011-03-20 (×6): qty 1

## 2011-03-20 MED ORDER — DIPHENHYDRAMINE HCL 50 MG PO CAPS
50.0000 mg | ORAL_CAPSULE | Freq: Every evening | ORAL | Status: DC | PRN
  Administered 2011-03-20: 50 mg via ORAL
  Filled 2011-03-20 (×4): qty 1

## 2011-03-20 MED ORDER — HALOPERIDOL 5 MG PO TABS
5.0000 mg | ORAL_TABLET | Freq: Four times a day (QID) | ORAL | Status: DC | PRN
  Administered 2011-03-20: 5 mg via ORAL
  Filled 2011-03-20: qty 1

## 2011-03-20 MED ORDER — DIVALPROEX SODIUM ER 500 MG PO TB24
500.0000 mg | ORAL_TABLET | Freq: Every day | ORAL | Status: DC
  Administered 2011-03-20 – 2011-03-21 (×2): 500 mg via ORAL
  Filled 2011-03-20 (×4): qty 1

## 2011-03-20 NOTE — Progress Notes (Addendum)
Patient ID: Erik [name], male   DOB: 03/02/1975, 36 y.o.   MRN: [id number] 03-20-11 nursing adm note: pt was a voluntary adm due to schizoaffective d/o. He has been in this facility before. On adm the pt was having trouble answering questions, having auditory hallucinations and showing s/s of responding to internal stimuli. He was talking to himself, was very disheveled and had trouble concentrating and answering adm questions.  He was having suicide ideation but had no plan. He denied any pain, allergies, is not a fall risk and is a smoker. He has been non compliant with his medication and had a low Depakote level. An order was obtained for a do not adm room due to the pt's inappropriate sexual behavior.  He denied any medical issues. He stated he lived in a hotel and listed his sister as a contact person but he didn't have a contact ph number for her. He is a poor historian. Labs are negative uds,  cmp  wnl and negative etoh.   Pt was escorted to the 400 hall and report was given to penny,rn......sbw  Pharmacy [facility] on [address] in [place]  Contact person sheila Guerrero no ph # given.

## 2011-03-20 NOTE — BH Assessment (Signed)
Assessment Note   Erik [name] is an 36 y.o. male, single, African-American who has a long history of schizoaffective disorder and initially presented to [facility] requesting inpatient psychiatric treatment for psychotic symptoms. He was transferred to [facility] for medical clearance and further evaluation. Tele-psychiatry evaluated Pt and recommended inpatient psychiatric treatment due to disorganized thought process, auditory hallucinations, paranoid delusions and anxiety.  Relayed current clinical information to Wonda Cerise, MD who agreed to accept Pt to [facility] when an appropriate bed is available. Consulted with Rosey Bath, [facility] who said an appropriate bed could be created in the morning after 9:00 am. Communicated this information to Renaldo Fiddler, assessment counselor at [facility].   Patsy Baltimore, Harlin Rain 03/20/2011 12:41 AM

## 2011-03-20 NOTE — ED Notes (Signed)
Pt currently eating breakfast  

## 2011-03-20 NOTE — Progress Notes (Signed)
Patient has been up and active on the unit. Patient is responding to internal stimuli and is talking out loud as if someone is standing beside him talking. Patient was informed that he can have a prn Haldol if needed and was given along with cogentin 1 mg which patient reports he take along with his Haldol to prevent involuntary movement of his legs. Patient has been pleasant and cooperative but disorganized thinking. Tylenol given for arms aching which patient requested also. -si/hi and visual hall. Safety maintained on unit, will continue to monitor.

## 2011-03-20 NOTE — Progress Notes (Signed)
Patient ID: Erik [name], male   DOB: 06/07/1975, 36 y.o.   MRN: [id number]   [facility] Notes:  (Counselor/Nursing/MHT/Case Management/Adjunct)  03/20/2011 11 AM  Type of Therapy:  Group Therapy, Dance/Movement Therapy   Participation Level:  Minimal  Participation Quality:  Inattentive and Redirectable  Affect:  Flat  Cognitive:  Confused and Hallucinating  Insight:  Limited  Engagement in Group:  Limited  Engagement in Therapy:  Limited  Modes of Intervention:  Clarification, Problem-solving, Role-play, Socialization and Support  Summary of Progress/Problems:  Group focused on healthy sources of support as well as healthy ways to maintain positive support for self in recovery. Pt named his 58 and 55 year old children as a healthy support in his life. He shared that being with his kids a smile on his face. Pt was talking to himself throughout group and it appeared to be difficult for him to make appropriate eye contact. This indicates that pt is very confused and responding to auditory hallucinations which is hindering him from focusing on recovery at this time.       Thomasena Edis, [employer]

## 2011-03-20 NOTE — H&P (Signed)
Psychiatric Admission Assessment Adult  Patient Identification:  Erik [name] Date of Evaluation:  03/20/2011 36yo sep AAM  CC Non-compliant and homeless History of Present Illness: First presented to ED on 3/10 due to not having his meds- was discharged represented 3/14 same presentation and again discharged. 3/16 says "i'm a schizophrenic man and I need a place to stay. Says that he heard voices all his life but once he lost employment in 2009 he finally went for "help". Doesn't get enough money to cover rent food and his meds.  When discharged in September he had follow up with [facility] [phone]. Today just wants [place] to get him a small house to live in.     Past Psychiatric History:  Here last fall.   Substance Abuse History:  Social History: Says he has degrees from [employer] & [employer] is separated has a son 23 and daughter 21. 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  He has a 6 pack-year smoking history. He does not have any smokeless tobacco history on file. He reports that he does not drink alcohol or use illicit drugs.  Family Psych History: Denies   Past Medical History:     Past Medical History  Diagnosis Date  . Schizoaffective disorder   . Depression   . Suicidal thoughts   . Anxiety       No past surgical history on file.  Allergies: No Known Allergies  Current Medications:  Prior to Admission medications   Medication Sig Start Date End Date Taking? Authorizing Provider  benztropine (COGENTIN) 1 MG tablet Take 1 tablet (1 mg total) by mouth 2 (two) times daily. 03/14/11  Yes Vida Roller, MD  divalproex (DEPAKOTE ER) 500 MG 24 hr tablet Take 1 tablet (500 mg total) by mouth daily. 12/02/10 12/02/11 Yes Lyanne Co, MD  fluPHENAZine (PROLIXIN) 5 MG tablet Take 1 tablet (5 mg total) by mouth daily. 03/17/11 04/16/11 Yes Lyanne Co, MD  haloperidol (HALDOL) 0.5 MG tablet Take 1 tablet (0.5 mg total) by mouth 2  (two) times daily. 03/14/11   Vida Roller, MD    Mental Status Examination/Evaluation: Objective:  Appearance: Fairly Groomed  Psychomotor Activity:  Normal  Eye Contact::  Good  Speech:  Normal Rate  Volume:  Normal  Mood: anxious at times    Affect:  Congruent  Thought Process:clear rational goal oriented- can you get me a small house?    Orientation:  Full  Thought Content:  AH but does appear to be responding to internal stimuli  Suicidal Thoughts:  No  Homicidal Thoughts:  No  Judgement:  Impaired  Insight:  Fair    DIAGNOSIS:    AXIS I Schizoaffective Disorder  AXIS II Deferred  AXIS III See medical history.  AXIS IV economic problems, housing problems, occupational problems, other psychosocial or environmental problems and problems with primary support group  AXIS V 51-60 moderate symptoms     Treatment Plan Summary: Admit for safety and stabilization. Start depakote and haldol prn Case manager to see about ACT and placement

## 2011-03-20 NOTE — BHH Suicide Risk Assessment (Signed)
Suicide Risk Assessment  Admission Assessment     Demographic factors:  Assessment Details Time of Assessment: Admission Information Obtained From: Patient Current Mental Status:   Denies SI, paranoid, NO AVH Loss Factors:  Loss Factors: Decrease in vocational status;Financial problems / change in socioeconomic status Historical Factors:  Historical Factors:  (unknown ) Risk Reduction Factors:  Risk Reduction Factors: Sense of responsibility to family  CLINICAL FACTORS:   Schizophrenia:   Paranoid or undifferentiated type  COGNITIVE FEATURES THAT CONTRIBUTE TO RISK:  Loss of executive function    SUICIDE RISK:   Mild:  Suicidal ideation of limited frequency, intensity, duration, and specificity.  There are no identifiable plans, no associated intent, mild dysphoria and related symptoms, good self-control (both objective and subjective assessment), few other risk factors, and identifiable protective factors, including available and accessible social support.  PLAN OF CARE:   Admit for safety and stabilization.  Start depakote and haldol prn  Case manager to see about ACT and placement    Wonda Cerise 03/20/2011, 8:10 PM

## 2011-03-20 NOTE — Progress Notes (Signed)
Patient ID: Erik [name], male   DOB: Jan 23, 1975, 36 y.o.   MRN: [id number]  Pt. attended and participated in aftercare planning group. Pt denied S/I and H/I. Pt. accepted information on suicide prevention, warning signs to look for with suicide and crisis line numbers to use. The pt. agreed to call crisis line numbers if having warning signs or having thoughts of suicide. Erik [name] that he could not explain or choose a number of where his anxiety and depression were today.

## 2011-03-20 NOTE — Progress Notes (Signed)
Pt has been calm and cooperative on the unit   He openly responds to internal stimuli and talks to people who aren't there   His thinking is disorganized and illogical  He responds well to directions  He attends group and does participate  Verbal support given  Medications administered and effectiveness monitored  Q 15 min checks  Pt safe at present

## 2011-03-20 NOTE — Tx Team (Signed)
Initial Interdisciplinary Treatment Plan  PATIENT STRENGTHS: (choose at least two) Motivation for treatment/growth  PATIENT STRESSORS: Financial difficulties Medication change or noncompliance Occupational concerns   PROBLEM LIST: Problem List/Patient Goals Date to be addressed Date deferred Reason deferred Estimated date of resolution  Schizoaffective d/o 03-20-11           Auditory hallucinations  03-20-11           Suicide ideation no plan  03-20-11                              DISCHARGE CRITERIA:  Improved stabilization in mood, thinking, and/or behavior Reduction of life-threatening or endangering symptoms to within safe limits  PRELIMINARY DISCHARGE PLAN: Attend aftercare/continuing care group Participate in family therapy  PATIENT/FAMIILY INVOLVEMENT: This treatment plan has been presented to and reviewed with the patient, Erik [name], and/or family member, .  The patient and family have been given the opportunity to ask questions and make suggestions.  Erik [name] 03/20/2011, 10:55 AM

## 2011-03-20 NOTE — H&P (Signed)
  Pt was seen by me today and I agree with the key elements documented in H&P.  

## 2011-03-20 NOTE — ED Provider Notes (Signed)
Pt had been accepted to [facility].  Was awaiting bed this AM.  Bed available now and can be transferred to [facility] once I am made aware of accepting physician.  Pt is medically cleared.    Gavin Pound. Oletta Lamas, MD 03/20/11 [phone]

## 2011-03-21 DIAGNOSIS — F200 Paranoid schizophrenia: Secondary | ICD-10-CM

## 2011-03-21 MED ORDER — BENZTROPINE MESYLATE 1 MG PO TABS
1.0000 mg | ORAL_TABLET | ORAL | Status: DC
  Administered 2011-03-21 – 2011-04-05 (×30): 1 mg via ORAL
  Filled 2011-03-21 (×32): qty 1

## 2011-03-21 MED ORDER — FLUPHENAZINE HCL 5 MG PO TABS
5.0000 mg | ORAL_TABLET | Freq: Every day | ORAL | Status: DC
  Administered 2011-03-21 – 2011-03-22 (×2): 5 mg via ORAL
  Filled 2011-03-21 (×3): qty 1

## 2011-03-21 MED ORDER — TRAZODONE HCL 100 MG PO TABS
100.0000 mg | ORAL_TABLET | Freq: Every evening | ORAL | Status: DC | PRN
  Administered 2011-03-22 – 2011-04-03 (×8): 100 mg via ORAL
  Filled 2011-03-21 (×8): qty 1

## 2011-03-21 MED ORDER — SERTRALINE HCL 50 MG PO TABS
50.0000 mg | ORAL_TABLET | Freq: Every day | ORAL | Status: DC
  Administered 2011-03-21 – 2011-03-23 (×3): 50 mg via ORAL
  Filled 2011-03-21 (×4): qty 1

## 2011-03-21 MED ORDER — DIVALPROEX SODIUM ER 500 MG PO TB24
1000.0000 mg | ORAL_TABLET | Freq: Every day | ORAL | Status: DC
  Administered 2011-03-22 – 2011-03-27 (×6): 1000 mg via ORAL
  Filled 2011-03-21 (×7): qty 2

## 2011-03-21 NOTE — Progress Notes (Signed)
Patient ID: Erik [name], male   DOB: 11-Mar-1975, 36 y.o.   MRN: [id number] Pt. Reports voices the same, but denies SHI. Pt. Request music, says voices not a bad with music. Staff to monitor and remove radio as soon pt. Asleep. Staff will monitor q31min for safety.

## 2011-03-21 NOTE — Progress Notes (Signed)
[facility] Adult Inpatient Family/Significant Other Suicide Prevention Education  Suicide Prevention Education:  Patient Refusal for Family/Significant Other Suicide Prevention Education: The patient Erik [name] has refused to provide written consent for family/significant other to be provided Family/Significant Other Suicide Prevention Education during admission and/or prior to discharge.  Physician notified.  Kizer Nobbe 03/21/2011, 10:00 AM

## 2011-03-21 NOTE — Progress Notes (Signed)
Adult Psychosocial Assessment Update Interdisciplinary Team  [facility] admissions/discharges:  Admissions Discharges  Date: 07/16/10-07/22/10 Date:07/15/10  Date: 09/10/10- 09/14/10 Date:09/03/10  Date: Date:09/09/10  Date: Date:  Date: Date:   Changes since the last Psychosocial Assessment (including adherence to outpatient mental health and/or substance abuse treatment, situational issues contributing to decompensation and/or relapse). Pt reported that he is no longer with his ACT team. Pt stated that agency wanted to cancel services. Pt reports that he would like to go back to school and work. Pt reports that he had became very depressed and felt as though people were talking about him. Pt also reports that he was non compliant with taking his medications.             Discharge Plan 1. Will you be returning to the same living situation after discharge?   Yes: No:      If no, what is your plan?    Pt reports that he plans to go back to a hotel in [place]. Pt does report that he is currently receiving disability.       2. Would you like a referral for services when you are discharged? Yes:     If yes, for what services?  No:       Pt would like assistance with finding housing.       Summary and Recommendations (to be completed by the evaluator) Pt is a 36 yr old African American single male who was admitted due to agitation and psychotic behaviors. Pt is currently unemployed and reports receiving disability. Pt reports that he had been receiving ACT services for four months before services were cancelled . Pt verbalized that he felt as though ACT was not doing their job. Pt will benefit from medication evaluation,group therapy, and psycho education in addition to case management for discharge planning.                        Signature:  Gerrit Heck, 03/21/2011 3:21 PM

## 2011-03-21 NOTE — Progress Notes (Signed)
Pt's affect is flat/depressed. He reports having voices last night. Pt hesitated when asked about voices at this time and then said "no." Pt did attend morning group. Offered support and 15 minute checks. Safety maintained on unit.

## 2011-03-21 NOTE — Discharge Planning (Signed)
Met with patient in Aftercare Planning Group as well as Treatment Team.  Patient reports that he wants housing, and that is his sole focus right now.  When we tried to discuss other issues such as how he used to have a payee to help him regulate his money usage, and now he doesn't and does not have enough money to last through the month, he stated that his only issue is housing.  He wants CM to find him housing.  CM did agree to give him telephone number for realtor for [facility] which has rooms for $450 and up.  Patient reports that he did not stay with the [facility] ACTT, and that he only is in contact with them now for his mail.  He stated that he gets his medication prescribed from [facility] in [facility].  He does not want to go to [facility], just left there and cannot return for some time.  Was kicked out of [facility] but hopes to return there; however, when CM offered him the number to call the director and see about returning, he declined it and said he cannot return there, and wants CM to find housing for him.  Patient's affect blunted, mood depressed, had little insight into process of securing housing.  Did not understand attempts to explain the process.  When asked about going through that process at [facility], for example, states he got upset with them because after 4 months they still didn't have housing for him.  In reality, it could take that plus some more time easily to locate housing.  Number for [facility] given to patient.  Ambrose Mantle, LCSW 03/21/2011, 2:35 PM

## 2011-03-21 NOTE — Tx Team (Signed)
Interdisciplinary Treatment Plan Update (Adult)  Date:  03/21/2011  Time Reviewed:  10:15AM-11:00AM  Progress in Treatment: Attending groups:  Yes Participating in groups:    Yes Taking medication as prescribed:    Yes Tolerating medication:   Yes Family/Significant other contact made:  No Patient understands diagnosis:   Yes, to limited extent; shows poor impulse control and poor judgment Discussing patient identified problems/goals with staff:   Yes Medical problems stabilized or resolved:   Yes Denies suicidal/homicidal ideation:  Yes Issues/concerns per patient self-inventory:   None Other:    New problem(s) identified: No, Describe:    Reason for Continuation of Hospitalization: Anxiety Depression Medication stabilization Other; describe negative thoughts are constant  Interventions implemented related to continuation of hospitalization:  Medication monitoring and adjustment, safety checks Q15 min., suicide risk assessment, group therapy, psychoeducation, collateral contact, aftercare planning, ongoing physician assessments, medication education  Additional comments:  Not applicable  Estimated length of stay:  2-4 days  Discharge Plan:  Return to hotel or shelter placement, follow up with ACTT to see if they will potentially agree to try to take on patient again  New goal(s):  Not applicable  Review of initial/current patient goals per problem list:   1.  Goal(s):  Medication stabilization  Met:  No  Target date:  By Discharge   As evidenced by:  Still working on this  2.  Goal(s):  Housing is needed, as patient will not take medications at shelter or on street.  Met:  No  Target date:  By Discharge   As evidenced by:  No housing secured for patient - he was given number for [facility]  3.  Goal(s):  Reduce depression and anxiety from 7 and 8 at 4th day to no more than 3 at discharge.  Met:  No  Target date:  By Discharge   As evidenced by:  Today  depression is "7" and anxiety is at "8"  4.  Goal(s):  Determine follow-up.  Met:  No  Target date:  By Discharge   As evidenced by:  Contact needed with ACTT  Attendees: Patient:  Erik [name]  03/21/2011 10:15AM-11:00AM  Family:     Physician:  Dr. Harvie Heck Readling 03/21/2011 10:15AM-11:00AM  Nursing:   Waynetta Sandy, RN 03/21/2011 10:15AM -11:00AM   Case Manager:  Ambrose Mantle, LCSW 03/21/2011 10:15AM-11:00AM  Counselor:  Veto Kemps, MT-BC 03/21/2011 10:15AM-11:00AM  Other:   Lynann Bologna, NP 03/21/2011 10:15AM-11:00AM  Other:   Osborn Coho, LCSW-A 03/21/2011 10:15AM-11:00AM  Other:      Other:       Scribe for Treatment Team:   Sarina Ser, 03/21/2011, 10:15AM-11:15AM

## 2011-03-21 NOTE — Progress Notes (Signed)
[facility] MD Progress Note  03/21/2011 1:16 PM  Diagnosis:  Axis I: Schizophrenia - Paranoid Type.   The patient was seen today and reports the following:   ADL's: Intact.  Sleep: The patient reports to having some difficulty initiating and maintaining sleep.  Appetite: The patient reports a good appetite today.   Mild>(1-10) >Severe  Hopelessness (1-10): 4 Depression (1-10): 7  Anxiety (1-10): 8   Suicidal Ideation: The patient denies any suicidal ideation today.  Plan: No  Intent: No  Means: No   Homicidal Ideation: The patient denies any homicidal ideations today.  Plan: No  Intent: No.  Means: No   General Appearance/Behavior: Appropriate and cooperative today.  Eye Contact: Good.  Speech: Appropriate in rate and volume today with no pressuring noted.  Motor Behavior: wnl.  Level of Consciousness: Alert and Oriented x 3.  Mental Status: Alert and Oriented x 3.  Mood: Moderately depressed.  Affect: Moderately constricted.  Anxiety Level: Severe anxiety reported.  Thought Process: wnl.  Thought Content: The patient denies any auditory or visual hallucinations today.  He also denies any delusional thinking.  Perception:. wnl.  Judgment: Fair to Good.  Insight: Fair to Good.  Cognition: Oriented to person, place and time.  Sleep:  Number of Hours: 6    Vital Signs:Blood pressure 109/77, pulse 88, temperature 97.9 F (36.6 C), temperature source Oral, resp. rate 18, height 5\' 11"  (1.803 m), weight 83.462 kg (184 lb), SpO2 98.00%.  Current Medications: Current Facility-Administered Medications  Medication Dose Route Frequency Provider Last Rate Last Dose  . acetaminophen (TYLENOL) tablet 650 mg  650 mg Oral Q6H PRN Mickie D. Adams, PA   650 mg at 03/20/11 2014  . alum & mag hydroxide-simeth (MAALOX/MYLANTA) 200-200-20 MG/5ML suspension 30 mL  30 mL Oral Q4H PRN Mickie D. Adams, PA      . benztropine (COGENTIN) tablet 1 mg  1 mg Oral BID Jorje Guild, PA   1 mg at 03/21/11  0810  . diphenhydrAMINE (BENADRYL) capsule 50 mg  50 mg Oral QHS,MR X 1 Mickie D. Adams, PA   50 mg at 03/20/11 2104  . divalproex (DEPAKOTE ER) 24 hr tablet 500 mg  500 mg Oral Daily Mickie D. Adams, PA   500 mg at 03/21/11 0810  . haloperidol (HALDOL) tablet 5 mg  5 mg Oral Q6H PRN Mickie D. Adams, PA   5 mg at 03/20/11 2105  . magnesium hydroxide (MILK OF MAGNESIA) suspension 30 mL  30 mL Oral Daily PRN Mickie D. Adams, PA       Lab Results: No results found for this or any previous visit (from the past 48 hour(s)).  The patient was seen today and reports to coming to the hospital for medication stabilization.  He reports moderate feelings of sadness, anhedonia and depressed mood but denies any suicidal or homicidal ideations.  He also denies any current auditory or visual hallucinations or delusional thinking.    Treatment Plan Summary:  1. Daily contact with patient to assess and evaluate symptoms and progress in treatment  2. Medication management  3. The patient will deny suicidal ideations or homicidal ideations for 48 hours prior to discharge and have a depression and anxiety rating of 3 or less. The patient will also deny any auditory or visual hallucinations or delusional thinking.   Plan:  1. Will continue current medications.  2. Will restart the medication Prolixin at 5 mgs po qhs for psychosis. 3. Will continue the medication Cogentin at  1 mg po q am and hs for EPS. 4. Will increase the medication Depakote ER to 1000 mgs po qhs for mood stabilization. 5. Will order Trazodone 100 mgs po qhs - prn for sleep. 6. Will start Zoloft at 50 mgs po q am for depression. 7. Laboratory studies reviewed.  8. Will continue to monitor.   Mandalyn Pasqua 03/21/2011, 1:16 PM

## 2011-03-21 NOTE — Progress Notes (Signed)
[facility] Group Notes:  (Counselor/Nursing/MHT/Case Management/Adjunct)  03/21/2011 2:39 PM 1:15PM Group  Type of Therapy:  Group Therapy  Participation Level:  Minimal  Participation Quality:  Inattentive and Redirectable  Affect:  Labile  Cognitive:  Alert  Insight:  Limited  Engagement in Group:  Limited  Engagement in Therapy:  Limited  Modes of Intervention:  Education, Problem-solving, Support and Exploratory  Summary of Progress/Problems: Pt stated that his main focus is finding somewhere to live. Pt stated that he wants someone to find out resources in the area and then follow-up with the resources. During group, pt appeared to be responding to auditory hallucinations as evidenced by talking to himself.   Sheritha Louis, Randal Buba 03/21/2011, 2:39 PM

## 2011-03-22 NOTE — Progress Notes (Signed)
Patient pacing back and forth in his room and actively hallucinating. He is having conversation with self. Talking and laughing without any one around him. He requested for a paper, pencil and radio. Writer gave patient pencil and paper. He denied SI/HI and denied hallucinations, although responding to internal stimuli. Patient does not appeared to be violent or aggressive during this admission. Q 15 minute continues to maintain safety.

## 2011-03-22 NOTE — Progress Notes (Signed)
[facility] Group Notes:  (Counselor/Nursing/MHT/Case Management/Adjunct)  03/22/2011 4:12 PM  Type of Therapy:  Group Therapy  Participation Level:  Did Not Attend   Veto Kemps 03/22/2011, 4:12 PM

## 2011-03-22 NOTE — Discharge Planning (Signed)
Met with patient in hall, as he refused to get out of bed and attend Aftercare Planning Group this morning.  He remains focused on housing, said he had indeed called the rental company whose number CM gave him yesterday. The rent is $350 with a $45 application fee.  CM offered to get him the application, which CM did, and gave to him to complete today for pick-up and delivery by CM to the company tomorrow if possible.  However, it does not appear that he has the $45 application fee right now.  Case Manager called patient's last ACT Team, and was told that they had discontinued services with him because he needs a "higher level of service."  He refused throughout their work with him to stay on meds, to take an injection, or to otherwise comply with treatment.  When CM raised the issue of patient being assigned a payee, they thought this was a good idea, but do not feel he would comply with that either.  Ambrose Mantle, LCSW 03/22/2011, 4:15 PM

## 2011-03-22 NOTE — Progress Notes (Signed)
Patient ID: Erik [name], male   DOB: [birth year], 36 y.o.   MRN: [id number] Erik [name] presents a disheveled is in the bed in scrubs. I attempted to interview him this morning, but he was sleepy and rather resistant to interview at that time. Today he reports that his main concern is a place to live. He reports he gets over $600 a month in [employer] but cannot find a place to live. He reports that he was taken care of by 3 previous ACT teams, but felt that they didn't make any progress with him so he fired him. Says he does not want to go to [facility].   Says he cannot go live in a shelter here in [place] until August. He reports that he owns a house here in [place] and has the taxes paid up for 30 years. 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  He reports his depression today as an 8/10 on a 1-10 scale if 10 is the worst symptoms. He denies any suicidal thoughts. He says hopelessness is not a problem because he feels like his life is going in a good direction. Denies any hearing any voices. Says that he drinks about a 12 pack a month of beer. Appetite is good.  Objective: Erik [name] presents disheveled with intermittent eye contact, mildly pressured speech, mood reflects, grandiosity and expansiveness. No dangerous ideas. He does not appear internally distracted. Possibly delusional thinking with claiming that he has several college degrees and plenty of money in his housing situation seems unclear.  Plan: We'll review with the team. Continue current meds.

## 2011-03-22 NOTE — Progress Notes (Signed)
Pt is mumbling to himself this morning. Writer cannot identify what pt is saying. He appears to be responding to internal stimuli. Pt verbally denies si and hi. Pt took medication as ordered. Safety maintained on unit.

## 2011-03-23 MED ORDER — FLUPHENAZINE HCL 10 MG PO TABS
10.0000 mg | ORAL_TABLET | Freq: Every day | ORAL | Status: DC
  Administered 2011-03-23 – 2011-04-04 (×13): 10 mg via ORAL
  Filled 2011-03-23 (×14): qty 1

## 2011-03-23 MED ORDER — SERTRALINE HCL 100 MG PO TABS
100.0000 mg | ORAL_TABLET | Freq: Every day | ORAL | Status: DC
  Administered 2011-03-24 – 2011-04-05 (×13): 100 mg via ORAL
  Filled 2011-03-23 (×14): qty 1

## 2011-03-23 NOTE — Progress Notes (Signed)
[facility] MD Progress Note  03/23/2011 1:13 PM  Diagnosis:  Axis I: Schizophrenia - Paranoid Type.   The patient was seen today and reports the following:   ADL's: Intact.  Sleep: The patient reports to sleeping well last night.  Appetite: The patient reports a decreased appetite today.   Mild>(1-10) >Severe  Hopelessness (1-10): 5  Depression (1-10): 8-9  Anxiety (1-10): 8   Suicidal Ideation: The patient was vague today concerning suicidal ideations.  He states "I hope it doesn't come to that." Plan: No  Intent: No  Means: No   Homicidal Ideation: The patient denies any homicidal ideations today.  Plan: No  Intent: No.  Means: No   General Appearance/Behavior: Appropriate and cooperative today.  Eye Contact: Good.  Speech: Appropriate in rate and volume today with no pressuring noted.  Motor Behavior: wnl.  Level of Consciousness: Alert and Oriented x 3.  Mental Status: Alert and Oriented x 3.  Mood: Severely depressed.  Affect: Moderately constricted.  Anxiety Level: Severe anxiety reported.  Thought Process: Moderately confused.  Thought Content: The patient denies any visual hallucinations today or any delusional thinking.  He appears to be responding to internal stimuli and has been seen on the unit talking to himself. Perception:. Auditory hallucinations continues.  Judgment: Fair.  Insight: Fair.  Cognition: Oriented to person, place and time.  Sleep:  Number of Hours: 5.75    Vital Signs:Blood pressure 125/75, pulse 108, temperature 97.7 F (36.5 C), temperature source Oral, resp. rate 16, height 5\' 11"  (1.803 m), weight 83.462 kg (184 lb), SpO2 98.00%.  Current Medications: Current Facility-Administered Medications  Medication Dose Route Frequency Provider Last Rate Last Dose  . acetaminophen (TYLENOL) tablet 650 mg  650 mg Oral Q6H PRN Mickie D. Adams, PA   650 mg at 03/20/11 2014  . alum & mag hydroxide-simeth (MAALOX/MYLANTA) 200-200-20 MG/5ML suspension 30  mL  30 mL Oral Q4H PRN Mickie D. Adams, PA      . benztropine (COGENTIN) tablet 1 mg  1 mg Oral BH-qamhs Curlene Labrum Danelia Snodgrass, MD   1 mg at 03/23/11 0757  . divalproex (DEPAKOTE ER) 24 hr tablet 1,000 mg  1,000 mg Oral QHS Curlene Labrum Drae Mitzel, MD   1,000 mg at 03/22/11 2123  . fluPHENAZine (PROLIXIN) tablet 10 mg  10 mg Oral QHS Marivel Mcclarty D Shyonna Carlin, MD      . magnesium hydroxide (MILK OF MAGNESIA) suspension 30 mL  30 mL Oral Daily PRN Mickie D. Adams, PA      . sertraline (ZOLOFT) tablet 50 mg  50 mg Oral Daily Curlene Labrum Rand Etchison, MD   50 mg at 03/23/11 0757  . traZODone (DESYREL) tablet 100 mg  100 mg Oral QHS PRN Curlene Labrum Bogdan Vivona, MD   100 mg at 03/22/11 2344  . DISCONTD: fluPHENAZine (PROLIXIN) tablet 5 mg  5 mg Oral QHS Curlene Labrum Adiva Boettner, MD   5 mg at 03/22/11 2123   Lab Results: No results found for this or any previous visit (from the past 48 hour(s)).  The patient was seen today and reports to having ongoing severe depressive symptoms.  He also continues to report severe anxiety and is responding to internal stimuli.  The patient states that he needs assistance in returning home and when asked what assistance he needs he stated "I don't know my address."  He also states that he needs help with "a job and finances."  Treatment Plan Summary:  1. Daily contact with patient to assess and evaluate  symptoms and progress in treatment  2. Medication management  3. The patient will deny suicidal ideations or homicidal ideations for 48 hours prior to discharge and have a depression and anxiety rating of 3 or less. The patient will also deny any auditory or visual hallucinations or delusional thinking.   Plan:  1. Will continue current medications.  2. Will increase the medication Prolixin to 10 mgs po qhs for psychosis.  3. Will increase the medication Zoloft to 100 mgs po q am for depression. 4. Laboratory studies reviewed.  5. Will continue to monitor.   Aloha Bartok 03/23/2011, 1:13 PM

## 2011-03-23 NOTE — Progress Notes (Signed)
Pt. Reports "not planning to die tonight" when asked if he has any thoughts of SI.  He has thought blocking and is often talking to himself under his breath. He stated he was unable to answer the question regarding AVH.

## 2011-03-23 NOTE — Discharge Planning (Signed)
Patient did not attend Aftercare Planning Group.  Not seen today.  Ambrose Mantle, LCSW 03/23/2011, 1:36 PM

## 2011-03-23 NOTE — Progress Notes (Signed)
Patient in bed, awake during this writer's round at about 2340.Patient reported difficulty falling asleep. He reported that he still hearing voices. Writer offered prn Trazodone 100 mg prn sleep. Pt received medication as ordered and returned to his room. Patient supported and encouraged. Q 15 minute check continues as ordered to maintain safety.

## 2011-03-23 NOTE — Progress Notes (Addendum)
Patient ID: Erik [name], male   DOB: [birth year], 36 y.o.   MRN: [id number]   Patient a little drowsy on approach but reports that he has been feeling "stressed out" recently. Reports depression "9" on scale with "1" for hopelessness. States that he wants to have hope. Currently denies any physical problems. Does report some passive SI at times but no plan. Patient noted in groups to be responding to internal stimuli. Contracts for safety. Having some placement issues. Took meds without issue. Staff will continue to monitor and encourage group attendance.

## 2011-03-23 NOTE — Progress Notes (Signed)
03/23/2011         Time: 0930      Group Topic/Focus: The focus of this group is on enhancing the patient's understanding of leisure, barriers to leisure, and the importance of engaging in positive leisure activities upon discharge for improved total health.  Participation Level: Minimal  Participation Quality: Attentive at times  Affect: Blunted  Cognitive: Disoriented  Additional Comments: Patient muttering to himself through most of group, conversation did not appear logical. Patient able to engage in group at times, but was mostly withdrawn.   Erik [name] 03/23/2011 10:06 AM

## 2011-03-23 NOTE — Progress Notes (Signed)
Patient ID: Erik [name], male   DOB: [birth year], 36 y.o.   MRN: [id number] Pt. Eyes closed, resp. Even, no distress noted. Staff will monitor q26min for safety. Pt. Remains safe on the unit.

## 2011-03-24 NOTE — Tx Team (Addendum)
Interdisciplinary Treatment Plan Update (Adult)  Date:  03/24/2011  Time Reviewed:  10:15AM-11:00AM  Progress in Treatment: Attending groups:  Yes, usually Participating in groups:    Only when called on, responding to internal stimuli throughout groups Taking medication as prescribed:    Yes Tolerating medication:   Yes Family/Significant other contact made:  Refuses Patient understands diagnosis:   No, believes he can resolve illness through "medication as a last resort" Discussing patient identified problems/goals with staff:  Yes  Medical problems stabilized or resolved:   Yes Denies suicidal/homicidal ideation:  Yes Issues/concerns per patient self-inventory:   None Other:    New problem(s) identified: Yes, Describe:  Patient remains psychotic, hearing voices and talking to unseen persons constantly.  Also delusional, states he wants to get a house and go to sleep for 6-8 years.  Reason for Continuation of Hospitalization: Delusions  Hallucinations Medication stabilization  Interventions implemented related to continuation of hospitalization:  Medication monitoring and adjustment, safety checks Q15 min., suicide risk assessment, group therapy, psychoeducation, collateral contact, aftercare planning, ongoing physician assessments, medication education  Additional comments:  Not applicable  Estimated length of stay:  5-6 days  Discharge Plan:  To be decided, as patient not agreeable to ACTT and the most recent team will not take him again.  New goal(s):  #5 - Reduce auditory hallucinations and delusions to baseline.  Review of initial/current patient goals per problem list:   1.  Goal(s):  Medication stabilization  Met:  No  Target date:  By Discharge   As evidenced by:  Still not stable  2.  Goal(s):  Housing is needed, as patient will not take medications at shelter or on street.  Met:  No  Target date:  By Discharge   As evidenced by:  Is applying for housing at  [facility]  3.  Goal(s):  Reduce depression and anxiety from 7 and 8 at 4th day to no more than 3 at discharge.  Met:  No  Target date:  By Discharge   As evidenced by:  Both at "8" today  4.  Goal(s):  Determine follow-up.  Met:  No  Target date:  By Discharge   As evidenced by:  Patient refuses to discuss options, does not believe he needs medication or any follow-up which will force the medication issue  Attendees: Patient:  Did not attend   Family:     Physician:  Dr. Harvie Heck Readling 03/24/2011 10:15AM-11:00AM  Nursing:   Izola Price, RN 03/24/2011 10:15AM -11:00AM   Case Manager:  Ambrose Mantle, LCSW 03/24/2011 10:15AM-11:00AM  Counselor:  Veto Kemps, MT-BC 03/24/2011 10:15AM-11:00AM  Other:   Lynann Bologna, NP 03/24/2011 10:15AM-11:00AM  Other:      Other:      Other:       Scribe for Treatment Team:   Sarina Ser, 03/24/2011, 10:15AM-11:15AM

## 2011-03-24 NOTE — Progress Notes (Addendum)
Patient continues to isolate in his room, he was lying in bed during this asessment. Although he appeared less calm and not talking to self as he has been doing since admitted. Patient stated; "I feel different , I feel kind of lonely".  He denied SI/HI and denied hallucinations; but still responding to internal stimuli. Mood and affect blunted and sad. Writer encouraged patient to go to the day room and socialize with peers. Patient also encouraged to take his shower. Q 15 minute check continues to maintain safety.

## 2011-03-24 NOTE — Progress Notes (Signed)
Patient continues to have near constant hallucinations.  He has been cooperative with medications and with other requests of the staff.  Not able to fully participate in groups due to hearing and responding to voices.  Appetite is good.

## 2011-03-24 NOTE — Discharge Planning (Signed)
Met with patient in Aftercare Planning Group.   He spoke to unseen persons throughout entire group.  When Case Manager asked him if he had completed housing application for [facility], he said he had already returned it to CM, which is not the case as CM has not seen him since giving him the application.  He became angry.  CM provided him with another copy of the application.  Utilization review done for additional days.  Ambrose Mantle, LCSW 03/24/2011, 12:29 PM

## 2011-03-24 NOTE — Progress Notes (Signed)
[facility] Group Notes:  (Counselor/Nursing/MHT/Case Management/Adjunct)  03/24/2011 9:10 AM  Type of Therapy:  Group Therapy 03/23/11  Participation Level:  Minimal  Participation Quality:  Inattentive, Redirectable and Sharing  Affect:  Irritable  Cognitive:  Hallucinating  Insight:  None  Engagement in Group:  Limited  Engagement in Therapy:  Limited  Modes of Intervention:  Education and Problem-solving  Summary of Progress/Problems: Patient spent most of the group talking to himself. He continued to blame others for not doing their job and helping him get housing. Talked about what happens when you lose hope. Encouraged him that if he took his medications, this would make it easier for people to help him like ACTT and this could help with housing, independence, etc. He stated medications were a last resort. Feels he should be able to get what he wants without medications.   Qunisha Bryk, Aram Beecham 03/24/2011, 9:10 AM

## 2011-03-24 NOTE — Progress Notes (Signed)
Patient ID: Erik [name], male   DOB: Sep 22, 1975, 36 y.o.   MRN: [id number] Erik [name] is fully alert today and presents slightly calmer. He is concerned about being paid for work he has done in the past. Says that one time he was paid for some work and was able to buy a car and a house where he went and slept for 6 years. He is rating his depression today an 8/10, anxiety and 8/10, on a 1-10 scale with 10 being the worst symptoms. He denies any suicidal thoughts. His antipsychotic dosage of Prolixin was doubled last night to 10 mg each bedtime.  He reports he has some depression because of the death of his parents but then says "I can't talk about it."    Objective: He continues to appear internally distracted with poor eye contact, some irrelevant responses, difficult to engage in meaningful conversation. Style is rambling but speech is nonpressured. He has not voiced any dangerous ideas and denies any suicidal thoughts. He does admit that he is hearing voices. Insight, impulse control, and judgement are all significantly impaired. He is fully oriented to person, place and situation.   Past psychiatric history: Record review reveals a history of multiple admissions at [facility] going back to 2009. In the past he has been stabilized on Zyprexa 5 mg each bedtime and Depakote 500 mg twice a day. And on another state, he was stabilized on Celexa 20 mg daily Haldol 5 mg each bedtime and benztropine. He is date [address] [facility], assisted-living facility in the past. His concerns about having him getting enough money has been a consistent theme in his previous history.   Will continue current plan.

## 2011-03-24 NOTE — Progress Notes (Signed)
[facility] Group Notes:  (Counselor/Nursing/MHT/Case Management/Adjunct)  03/24/2011 12:51 PM  Type of Therapy:  Group Therapy  Participation Level:  Did Not Attend   Diasia Henken 03/24/2011, 12:51 PM 

## 2011-03-25 MED ORDER — FLUPHENAZINE HCL 5 MG PO TABS
5.0000 mg | ORAL_TABLET | Freq: Every day | ORAL | Status: DC
  Administered 2011-03-26 – 2011-03-29 (×4): 5 mg via ORAL
  Filled 2011-03-25 (×6): qty 1

## 2011-03-25 NOTE — Progress Notes (Signed)
Patient ID: Erik [name], male   DOB: 10-Jul-1975, 36 y.o.   MRN: [id number] Pt is awake and active on the unit this AM. Pt denies HI and VH but endorses passive SI and AH. Pt is able to contract for safety. Pt states that he hears voices that say he should "act accordingly," and that "things are all messed up." Pt forwards little and is somewhat withdrawn but is cooperative with staff. Writer will continue to monitor.

## 2011-03-25 NOTE — Progress Notes (Signed)
[facility] MD Progress Note  03/25/2011 2:53 PM  Diagnosis:  Axis I: Schizophrenia - Paranoid Type.   The patient was seen today and reports the following:   ADL's: Intact.  Sleep: The patient reports to again sleeping well last night.  Appetite: The patient reports a good appetite today.   Mild>(1-10) >Severe  Hopelessness (1-10): 0  Depression (1-10): 8  Anxiety (1-10): 5-6   Suicidal Ideation: The patient again was vague concerning suicidal ideations. He again states "I hope it doesn't come to that."  Plan: No  Intent: No  Means: No   Homicidal Ideation: The patient adamantly denies any homicidal ideations today.  Plan: No  Intent: No.  Means: No   General Appearance/Behavior: Appropriate and cooperative today but appears to be internally preoccupied.  Eye Contact: Good.  Speech: Appropriate in rate and volume today with no pressuring noted.  Motor Behavior: wnl.  Level of Consciousness: Alert and Oriented x 3.  Mental Status: Alert and Oriented x 3.  Mood: Severely depressed.  Affect: Moderately constricted.  Anxiety Level: Moderate anxiety reported.  Thought Process: Moderately confused.  Thought Content: The patient denies any visual hallucinations today or any delusional thinking. He appears to be responding to internal stimuli and does report auditory hallucinations today. Perception:. Auditory hallucinations continues.  Judgment: Fair.  Insight: Fair.  Cognition: Oriented to person, place and time.  Sleep:  Number of Hours: 6    Vital Signs:Blood pressure 119/69, pulse 120, temperature 97.6 F (36.4 C), temperature source Oral, resp. rate 18, height 5\' 11"  (1.803 m), weight 83.462 kg (184 lb), SpO2 98.00%.  Current Medications: Current Facility-Administered Medications  Medication Dose Route Frequency Provider Last Rate Last Dose  . acetaminophen (TYLENOL) tablet 650 mg  650 mg Oral Q6H PRN Mickie D. Adams, PA   650 mg at 03/20/11 2014  . alum & mag  hydroxide-simeth (MAALOX/MYLANTA) 200-200-20 MG/5ML suspension 30 mL  30 mL Oral Q4H PRN Mickie D. Adams, PA      . benztropine (COGENTIN) tablet 1 mg  1 mg Oral BH-qamhs Curlene Labrum Darsha Zumstein, MD   1 mg at 03/25/11 0823  . divalproex (DEPAKOTE ER) 24 hr tablet 1,000 mg  1,000 mg Oral QHS Curlene Labrum Manases Etchison, MD   1,000 mg at 03/24/11 2146  . fluPHENAZine (PROLIXIN) tablet 10 mg  10 mg Oral QHS Curlene Labrum Terrion Poblano, MD   10 mg at 03/24/11 2146  . fluPHENAZine (PROLIXIN) tablet 5 mg  5 mg Oral Q breakfast Ivor Kishi D Danyle Boening, MD      . magnesium hydroxide (MILK OF MAGNESIA) suspension 30 mL  30 mL Oral Daily PRN Mickie D. Adams, PA      . sertraline (ZOLOFT) tablet 100 mg  100 mg Oral Daily Curlene Labrum Parisa Pinela, MD   100 mg at 03/25/11 0823  . traZODone (DESYREL) tablet 100 mg  100 mg Oral QHS PRN Ronny Bacon, MD   100 mg at 03/24/11 2147   Lab Results: No results found for this or any previous visit (from the past 48 hour(s)).  The patient was seen today and reports to having ongoing severe depressive symptoms. He did admit today that he was experiencing auditory hallucinations and often will talk to them to try to calm them.  He continues to express concerns related to not remembering his address.  Treatment Plan Summary:  1. Daily contact with patient to assess and evaluate symptoms and progress in treatment  2. Medication management  3. The patient will deny  suicidal ideations or homicidal ideations for 48 hours prior to discharge and have a depression and anxiety rating of 3 or less. The patient will also deny any auditory or visual hallucinations or delusional thinking.   Plan:  1. Will continue current medications.  2. Will increase the medication Prolixin to 5 mgs po q am and 10 mgs po qhs for psychosis.  3. Will continue the medication Zoloft to 100 mgs po q am for depression.  4. Laboratory studies reviewed.  5. Will obtain a CBC with Diff, CMP and Serum Depakote Level on 03/27/11. 6. Will continue  to monitor.   Judeen Geralds 03/25/2011, 2:53 PM

## 2011-03-25 NOTE — Progress Notes (Signed)
Pt has been isolative to room for much of the shift, very minimal interaction or participation inside milieu, pt did endorse having feelings of depression and admitted auditory hallucinations, pt often seen talking to himself and appeared internally distracted, pt did receive all medications without incident, support provided, will continue to monitor

## 2011-03-25 NOTE — Progress Notes (Signed)
[facility] Adult Inpatient Family/Significant Other Suicide Prevention Education  Suicide Prevention Education:  Patient Refusal for Family/Significant Other Suicide Prevention Education: The patient Erik [name] has refused to provide written consent for family/significant other to be provided Family/Significant Other Suicide Prevention Education during admission and/or prior to discharge.  Physician notified.  HartisAram Beecham 03/25/2011, 2:17 PM

## 2011-03-25 NOTE — Progress Notes (Signed)
[facility] Group Notes:  (Counselor/Nursing/MHT/Case Management/Adjunct)  03/25/2011 10:53 AM 9:30 Group  Type of Therapy:  Group Therapy  Participation Level:  Minimal  Participation Quality:  Inattentive  Affect:  Flat  Cognitive:  Alert and Delusional  Insight:  Limited  Engagement in Group:  Limited  Engagement in Therapy:  Limited  Modes of Intervention:  Clarification, Problem-solving, Socialization, Support and Exploratory  Summary of Progress/Problems: During group, pt appeared to be responding to voices. Pt was able to be redirected. Pt was able to give limited insight as it relates to his feelings about recovery and relapse. Pt verbalized that he feels as though he could talk to a counselor about his feelings.    Random Dobrowski, Randal Buba 03/25/2011, 10:53 AM

## 2011-03-25 NOTE — Discharge Planning (Signed)
Met with patient in Aftercare Planning Group.  He responded to internal stimuli throughout group, talking to himself constantly.  He did give Case Manager his application for an apartment at [facility]; however, he does not currently have the $45 processing fee.  Application is on CM desk.  During Aftercare Planning Group, Case Manager provided psychoeducation on "Suicide Prevention Information."  This included descriptions of risk factors for suicide, warning signs that an individual is in crisis and thinking of suicide, and what to do if this occurs.  Pt indicated understanding of information provided, and will read brochure given upon discharge.     Not ready for discharge.  Ambrose Mantle, LCSW 03/25/2011, 2:19 PM

## 2011-03-26 NOTE — Progress Notes (Signed)
Pt isolates in his room  His interactions with others are limited and rarely initiated  He appears to respond to internal stimuli  He occasionally attends group but his participation is minimal  He appears to have cognitive limitations and his thoughts expressed have poverty of content   Verbal support given  Medications administered and effectiveness monitored  Q 15 min checks   Pt safe at present

## 2011-03-26 NOTE — Progress Notes (Signed)
Patient ID: Erik [name], male   DOB: Aug 02, 1975, 36 y.o.   MRN: [id number]  [facility] Group Notes:  (Counselor/Nursing/MHT/Case Management/Adjunct)  03/26/2011 11 AM  Type of Therapy:  Group Therapy, Dance/Movement Therapy   Participation Level:  Did Not Attend  Kym Groom

## 2011-03-26 NOTE — Progress Notes (Signed)
[facility] MD Progress Note  03/26/2011 5:15 PM  Diagnosis:  Schizophrenia, paranoid type  ADL's:  Intact  Sleep: Good  Appetite:  Good  Suicidal Ideation:   Homicidal Ideation:    AEB (as evidenced by): Pt.is sleeping soundly and snoring loudly.  Chart reviewed, no new concerns from the staff.  Vital Signs:Blood pressure 110/65, pulse 106, temperature 97.5 F (36.4 C), temperature source Oral, resp. rate 16, height 5\' 11"  (1.803 m), weight 83.462 kg (184 lb), SpO2 98.00%. Current Medications: Current Facility-Administered Medications  Medication Dose Route Frequency Provider Last Rate Last Dose  . acetaminophen (TYLENOL) tablet 650 mg  650 mg Oral Q6H PRN Mickie D. Adams, PA   650 mg at 03/20/11 2014  . alum & mag hydroxide-simeth (MAALOX/MYLANTA) 200-200-20 MG/5ML suspension 30 mL  30 mL Oral Q4H PRN Mickie D. Adams, PA      . benztropine (COGENTIN) tablet 1 mg  1 mg Oral BH-qamhs Curlene Labrum Readling, MD   1 mg at 03/26/11 0814  . divalproex (DEPAKOTE ER) 24 hr tablet 1,000 mg  1,000 mg Oral QHS Curlene Labrum Readling, MD   1,000 mg at 03/25/11 2103  . fluPHENAZine (PROLIXIN) tablet 10 mg  10 mg Oral QHS Curlene Labrum Readling, MD   10 mg at 03/25/11 2103  . fluPHENAZine (PROLIXIN) tablet 5 mg  5 mg Oral Q breakfast Curlene Labrum Readling, MD   5 mg at 03/26/11 0813  . magnesium hydroxide (MILK OF MAGNESIA) suspension 30 mL  30 mL Oral Daily PRN Mickie D. Adams, PA      . sertraline (ZOLOFT) tablet 100 mg  100 mg Oral Daily Curlene Labrum Readling, MD   100 mg at 03/26/11 0813  . traZODone (DESYREL) tablet 100 mg  100 mg Oral QHS PRN Ronny Bacon, MD   100 mg at 03/25/11 2315    Lab Results: No results found for this or any previous visit (from the past 48 hour(s)). Treatment Plan Summary: Daily contact with patient to assess and evaluate symptoms and progress in treatment Medication management  Plan:  Continue current plan of care with no changes at this time. Jaman Aro 03/26/2011, 5:15 PM

## 2011-03-26 NOTE — Progress Notes (Signed)
Patient resting quietly now with eyes closed. Respirations even and unlabored. No distress noted. He requested for Trazodone earlier to help his sleep. Trazodone effective. Q 15 minute check continues to maintain safety.

## 2011-03-26 NOTE — Progress Notes (Signed)
Writer entered patients room and observed him lying in bed awake. Writer introduced self and asked patient if he knew his night time medications. Patient gave the names of his medications but not the dosage which writer informed patient of the dosages. Patient admits to hearing voices but reports he can't make out what they are saying. Patient currently denies having pain, -si/hi/a/v hall. Safety maintained on unit will continue to monitor.

## 2011-03-27 LAB — COMPREHENSIVE METABOLIC PANEL
ALT: 12 U/L (ref 0–53)
AST: 15 U/L (ref 0–37)
CO2: 30 mEq/L (ref 19–32)
Calcium: 9.4 mg/dL (ref 8.4–10.5)
Chloride: 101 mEq/L (ref 96–112)
GFR calc Af Amer: >90 mL/min (ref >90)
GFR calc non Af Amer: >90 mL/min (ref >90)
Glucose, Bld: 93 mg/dL (ref 70–99)
Sodium: 138 mEq/L (ref 135–145)
Total Bilirubin: 0.2 mg/dL — ABNORMAL LOW (ref 0.3–1.2)

## 2011-03-27 LAB — DIFFERENTIAL
Basophils Absolute: 0 10*3/uL (ref 0.0–0.1)
Basophils Relative: 0 % (ref 0–1)
Eosinophils Absolute: 0.1 10*3/uL (ref 0.0–0.7)
Monocytes Relative: 11 % (ref 3–12)
Neutro Abs: 2.2 10*3/uL (ref 1.7–7.7)
Neutrophils Relative %: 43 % (ref 43–77)

## 2011-03-27 LAB — VALPROIC ACID LEVEL: Valproic Acid Lvl: 43.6 ug/mL — ABNORMAL LOW (ref 50.0–100.0)

## 2011-03-27 LAB — CBC
Hemoglobin: 13.8 g/dL (ref 13.0–17.0)
MCH: 30.1 pg (ref 26.0–34.0)
MCHC: 34.1 g/dL (ref 30.0–36.0)
Platelets: 189 10*3/uL (ref 150–400)
RDW: 13.5 % (ref 11.5–15.5)

## 2011-03-27 NOTE — Progress Notes (Signed)
Patient ID: Erik [name], male   DOB: Sep 24, 1975, 35 y.o.   MRN: [id number]   [facility] Group Notes:  (Counselor/Nursing/MHT/Case Management/Adjunct)  03/27/2011 11 AM  Type of Therapy:  Group Therapy, Dance/Movement Therapy   Participation Level:  Active  Participation Quality:  Inattentive and Redirectable  Affect:  Anxious  Cognitive:  Confused and Delusional  Insight:  None  Engagement in Group:  Limited  Engagement in Therapy:  Limited  Modes of Intervention:  Clarification, Problem-solving, Role-play, Socialization and Support  Summary of Progress/Problems:  Pt named that he is lacking sources of supports in his life. Group discussed healthy and unhealthy forms of supporting or not supporting themselves when their external supports are not available. The group made a commitment to spend time with at least one peer later today as a way to practice giving and receiving emotional support; however, pt stated that he was going to remain to himself. Throughout group, pt was talking to himself at a low tone with his focus down towards to the floor. This indicates rumination and that he is lacking the confidence to formulate a recovery plan.  Thomasena Edis, [employer]

## 2011-03-27 NOTE — Progress Notes (Signed)
Patient ID: Erik [name], male   DOB: October 08, 1975, 36 y.o.   MRN: [id number]  Pt. attended and participated in aftercare planning group. Pt. accepted information on suicide prevention, warning signs to look for with suicide and crisis line numbers to use. The pt. agreed to call crisis line numbers if having warning signs or having thoughts of suicide. Pt. listed their current anxiety level as high and his depression as high. Pt asked if case manager knew his home address or could find it and get a key to his home to him. Pt shared that he is glad to be here and that he knows he needs help.

## 2011-03-27 NOTE — Progress Notes (Signed)
Patient ID: Erik [name], male   DOB: 1975-06-17, 36 y.o.   MRN: [id number] [facility] MD Progress Note  03/27/2011 5:35 PM  Diagnosis:  Axis I: Schizophrenia paranoid type  ADL's:  Intact  Sleep:  Yes,  AEB:  Appetite: good Suicidal Ideation:  No                Plan No                Intent No                     Means No         Homicidal Ideation:   No  Plan:  No  Intent:  No  Means:  No  Subjective: [name] is up and awake in the day room today.  He is pleasant and cordial with me.  He voices concern that he can not find the house he bought because he doesn't remember the address and he can't get a key made.... He denies any side effects from the medication and states that the voices are gone today. He states no new symptoms but is clearly fixed on the "house." BP 96/74  Pulse 99  Temp(Src) 97.4 F (36.3 C) (Oral)  Resp 16  Ht 5\' 11"  (1.803 m)  Wt 83.462 kg (184 lb)  BMI 25.66 kg/m2  SpO2 98% Objective: Speech is clear and goal directed but also focused on his house issues.  He is somewhat anxious and concerned about this but can be redirected.  Denies AH today, and no visual hallucinations.  Mental Status: General Appearance  Behavior:  Casual Eye Contact:  Good Motor Behavior:  Restlestness Speech:  Normal Level of Consciousness:  Alert Mood:  Anxious Affect:  Appropriate Anxiety Level:  Moderate Thought Process:  Disorganized Thought Content:  Paranoid Ideation Perception:  Normal Judgment:  Poor Insight:  Absent Cognition:  Orientation time, place and person off x 3 Sleep:  Number of Hours: 6.75   Vital Signs:Blood pressure 96/74, pulse 99, temperature 97.4 F (36.3 C), temperature source Oral, resp. rate 16, height 5\' 11"  (1.803 m), weight 83.462 kg (184 lb), SpO2 98.00%.  Lab Results: No results found for this or any previous visit (from the past 48 hour(s)).  Physical Findings: AIMS:  , ,  ,  ,    CIWA:    COWS:     Treatment Plan Summary: Daily  contact with patient to assess and evaluate symptoms and progress in treatment Medication management  Plan: Continue current plan of care. No changes at this time.  Alaja Goldinger 03/27/2011, 5:35 PM

## 2011-03-27 NOTE — Progress Notes (Signed)
Patient currently is lying in bed awake with lights off when writer entered patients room. Patient reports that he has been up and going to meals and groups today when asked. Patient was informed that there are no changes to his hs medications.  When asked if he hears voices patient reports that he still does and reports they are telling him to do things right.  Patient is isolative to his room and encouraged if he feels up to it to come to dayroom and watch tv. Currently denies having pain, -si/hi/ vis hall. Writer asked if patient needs anything at this time and patient reports no. Safety maintained on unit, will continue to monitor.

## 2011-03-27 NOTE — Progress Notes (Signed)
Pt isolates in his room and is usually laying in bed  He does not initiate interaction with others  He does go to the cafeteria for meals but does not attend groups  His appetite is good and he takes his medications without difficulty   Verbal support given  Medications administered and effectiveness monitored  Q 15 min checks   Pt safe at present

## 2011-03-28 MED ORDER — DIVALPROEX SODIUM ER 500 MG PO TB24
2000.0000 mg | ORAL_TABLET | Freq: Every day | ORAL | Status: DC
  Administered 2011-03-28: 1000 mg via ORAL
  Filled 2011-03-28 (×3): qty 4

## 2011-03-28 MED ORDER — DIVALPROEX SODIUM ER 500 MG PO TB24: 1500.0000 mg | ORAL_TABLET | Freq: Every day | ORAL | Status: DC

## 2011-03-28 NOTE — Progress Notes (Addendum)
Patient ID: Erik [name], male   DOB: [birth year], 36 y.o.   MRN: [id number]  Raheem has been group this morning and presents fully alert. Eye contact is poor and he appears to be internally distracted. However speech is normal in tone, pace, and form, though volume is decreased, and he is difficult to understand, almost inaudible at times.   He says his sleep is okay, denies any side effects from medications such as constipation or difficulty starting her urinary stream. He has nocturia x1. He is vague about his auditory hallucinations, and initially says that he can't hear them at all, but later thinks that they might wake him up in the middle of the night.  He is also rather vague about suicidal thoughts saying that he hopes he doesn't harm himself. He reports his depression is an 8/10, and would be better if he could just find his house. He reports his anxiety is a 5/10.  Denies homicidal thoughts, denies suicidal thoughts at this time. Says he'll feel better we can find the house that he bought. He repeatedly makes references to the fact that he owns a house, and apparently he did at one time but is not known that he owns property at this time. He is currently homeless.  I have attempted to talk to him about discharge plans that he rejects the idea of working with an act team, not sure that he needs to take medications after discharge. He has taken Haldol decanoate in the past but not recently.  Diagnostic Studies:  VPA 43.6, CMP and CBC are normal.   Plan:  Will increase Depakote to 1500mg  ER q h.s. And reschedule another VPA level.  Findings reviewed in Team Meeting.

## 2011-03-28 NOTE — Progress Notes (Signed)
Pt has been isolative to room much of the day today, very limited interaction or participation today, pt has endorsed feelings of depression and auditory hallucinations, pt has received all medications without incident, support provided, will continue to monitor

## 2011-03-28 NOTE — Discharge Planning (Signed)
Met with patient in Aftercare Planning Group.   He did some soft talking, responding to internal stimuli, for part of group, but it was neither loud nor continuous and did not require redirection.  When it was his turn to speak, patient reported that he believes that the medication is now helping him, and maybe he should stay on it in order to achieve what he wants in life, such as make some money to take care of some bills.  Patient raised a possible problem with being able to stay on the medications, saying that he does not know if it is too complicated to follow.  Case Manager described how the ACT Team can help him with a pillpack, but he refused to be referred to another ACT Team, having already tried 2 in the past and felt they did not help him.  Case Manager went into detail about the pillpack, but patient did not change his mind.  Case Manager then told him about local pharmacies which will pack his meds into a pillpack for him, such as [facility] or [facility].  He seemed to be interested in this service.  Patient stated he does not know where he will go at discharge but does understand that he cannot be in the [facility] apartment yet.  CM reminded him that he needs to give in the $45 application processing fee prior to them checking his application.  He asked that CM call [employer] to check that again.  CM did this, and was told that the application can be sent by fax to them (as long as patient brings original copy to their offices with him after discharge), and it will be held until he provides them with the $45 processing fee.  They cannot go ahead and process the application however.    No other known case management needs today.  Ambrose Mantle, LCSW 03/28/2011, 10:09 AM

## 2011-03-28 NOTE — Progress Notes (Signed)
Pt. Refused his bed time dosage of  Depakote 2000mg   And would only take 1000mg  of the Depakote.

## 2011-03-28 NOTE — Progress Notes (Signed)
[facility] Group Notes:  (Counselor/Nursing/MHT/Case Management/Adjunct)  03/28/2011 2:41 PM  Type of Therapy:  Group Therapy  Participation Level:  Minimal  Patient left group shortly after it started.  HartisAram [name] 03/28/2011, 2:41 PM

## 2011-03-28 NOTE — Progress Notes (Signed)
Pt. Has been in his room but went to the cafeteria for supper.  Pt. Reports that he does hear voices but can't explain what they are saying.  Reports that they are not telling him to harm himself.  Support given.

## 2011-03-29 MED ORDER — DIVALPROEX SODIUM ER 500 MG PO TB24
500.0000 mg | ORAL_TABLET | Freq: Every day | ORAL | Status: DC
  Administered 2011-03-30 – 2011-04-05 (×7): 500 mg via ORAL
  Filled 2011-03-29 (×8): qty 1

## 2011-03-29 MED ORDER — FLUPHENAZINE HCL 10 MG PO TABS
10.0000 mg | ORAL_TABLET | Freq: Every day | ORAL | Status: DC
  Administered 2011-03-30 – 2011-04-05 (×7): 10 mg via ORAL
  Filled 2011-03-29 (×5): qty 1
  Filled 2011-03-29: qty 2
  Filled 2011-03-29 (×3): qty 1

## 2011-03-29 MED ORDER — DIVALPROEX SODIUM ER 500 MG PO TB24
1500.0000 mg | ORAL_TABLET | Freq: Every day | ORAL | Status: DC
  Administered 2011-03-29 – 2011-04-04 (×7): 1500 mg via ORAL
  Filled 2011-03-29 (×8): qty 3

## 2011-03-29 NOTE — Progress Notes (Signed)
Patient ID: Erik [name], male   DOB: Feb 17, 1975, 36 y.o.   MRN: [id number] Pt denies SI/HI, AH present but not commanding him to do anything.  Flat affect, minimal interaction.  Denies pain, attending groups with a small amount of interaction.

## 2011-03-29 NOTE — Tx Team (Signed)
Interdisciplinary Treatment Plan Update (Adult)  Date:  03/29/2011  Time Reviewed:  10:15AM-11:00AM  Progress in Treatment: Attending groups:  Yes Participating in groups:  Yes   Taking medication as prescribed:    No, only took part of his Depakote last night Tolerating medication:   Yes Family/Significant other contact made:  No Patient understands diagnosis:   Yes, limited insight and poor judgment Discussing patient identified problems/goals with staff:   Yes Medical problems stabilized or resolved:   Yes Denies suicidal/homicidal ideation:  YesNone Issues/concerns per patient self-inventory:    Other:    New problem(s) identified: No, Describe:    Reason for Continuation of Hospitalization: Delusions  Depression Hallucinations Medication stabilization Other; describe Placement issues  Interventions implemented related to continuation of hospitalization:  Medication monitoring and adjustment, safety checks Q15 min., suicide risk assessment, group therapy, psychoeducation, collateral contact, aftercare planning, ongoing physician assessments, medication education  Additional comments:  Not applicable  Estimated length of stay:  5-6 days  Discharge Plan:  Unknown, as patient does not have resources until check comes on 04/04/11, is not agreeable to ACTT which is needed, cannot return to former ACTT, cannot return to local shelters  New goal(s):  Not applicable  Review of initial/current patient goals per problem list:   1.  Goal(s):  Medication stabilization  Met:  No  Target date:  By Discharge   As evidenced by:  Improvements in behavior, but not taking his Depakote properly and Depakote level remains below therapeutic level  2.  Goal(s):  Housing is needed, as patient will not take medications at shelter or on street.  Met:  No  Target date:  By Discharge   As evidenced by:  Patient still wants to apply for housing at [facility], but cannot pay processing fee  until 04/04/11, will not be able to go straight there even if approved  3.  Goal(s):  Reduce depression and anxiety from 7 and 8 at 4th to no more than 3 at discharge.  Met:  No  Target date:  By Discharge   As evidenced by:  Improving, but still not at goal  4.  Goal(s):  Determine follow-up  Met:  No  Target date:  By Discharge   As evidenced by:  No progress     5.  Goal(s):  Reduce auditory hallucinations and delusions to baseline.  Met:  No  Target date:  By Discharge   As evidenced by:  Seems to be somewhat better.   Attendees: Patient:  Erik [name]  03/29/2011 10:15AM-11:00AM  Family:     Physician:  Dr. Harvie Heck Readling 03/29/2011 10:15AM-11:00AM  Nursing:   Nanine Means, RN 03/29/2011 10:15AM -11:00AM   Case Manager:  Ambrose Mantle, LCSW 03/29/2011 10:15AM-11:00AM  Counselor:  Veto Kemps, MT-BC 03/29/2011 10:15AM-11:00AM  Other:      Other:      Other:      Other:       Scribe for Treatment Team:   Sarina Ser, 03/29/2011, 10:15AM-11:15AM

## 2011-03-29 NOTE — Discharge Planning (Signed)
Met with patient in Aftercare Planning Group.   He was quieter in group today, not talking out loud to himself as loudly or frequently, although still some occasional mumblings.  Pointed out to patient that he had not answered all the questions on Application for [facility], and he is going to redo those and try to do his best handwriting instead of scribbling things out, as there were some areas hard to read on original application.  Awaiting this back from patient.  Did utilization review asking for additional days.  Ambrose Mantle, LCSW 03/29/2011, 2:09 PM

## 2011-03-29 NOTE — Progress Notes (Signed)
[facility] MD Progress Note  03/29/2011 3:56 PM  Diagnosis:  Axis I: Schizophrenia - Paranoid Type.   The patient was seen today and reports the following:   ADL's: Intact.  Sleep: The patient reports to continuing to sleep well at night.  Appetite: The patient reports a good appetite today.   Mild>(1-10) >Severe  Hopelessness (1-10): 0  Depression (1-10): 7-8   Anxiety (1-10): 5   Suicidal Ideation: The patient denied any suicidal ideations today. Plan: No  Intent: No  Means: No   Homicidal Ideation: The patient adamantly denies any homicidal ideations today.  Plan: No  Intent: No.  Means: No   General Appearance/Behavior: Appropriate and cooperative today.  Eye Contact: Good.  Speech: Appropriate in rate and volume today with no pressuring noted.  Motor Behavior: wnl.  Level of Consciousness: Alert and Oriented x 3.  Mental Status: Alert and Oriented x 3.  Mood: Moderate to Severely depressed.  Affect: Moderately constricted.  Anxiety Level: Moderate anxiety reported.  Thought Process: Appears internally preoccupied.  Thought Content: The patient denies any visual hallucinations today or any delusional thinking. He states that his auditory hallucinations "come and go" depending on the situation. Perception:. Auditory hallucinations continue but "come and go."  Judgment: Fair.  Insight: Fair.  Cognition: Oriented to person, place and time.  Sleep:  Number of Hours: 6.75    Vital Signs:Blood pressure 83/51, pulse 93, temperature 98.2 F (36.8 C), temperature source Oral, resp. rate 16, height 5\' 11"  (1.803 m), weight 83.462 kg (184 lb), SpO2 98.00%.  Current Medications: Current Facility-Administered Medications  Medication Dose Route Frequency Provider Last Rate Last Dose  . acetaminophen (TYLENOL) tablet 650 mg  650 mg Oral Q6H PRN Mickie D. Adams, PA   650 mg at 03/28/11 0802  . alum & mag hydroxide-simeth (MAALOX/MYLANTA) 200-200-20 MG/5ML suspension 30 mL  30 mL Oral  Q4H PRN Mickie D. Adams, PA   30 mL at 03/28/11 2015  . benztropine (COGENTIN) tablet 1 mg  1 mg Oral BH-qamhs Curlene Labrum Renise Gillies, MD   1 mg at 03/29/11 0745  . divalproex (DEPAKOTE ER) 24 hr tablet 1,500 mg  1,500 mg Oral QHS Curlene Labrum Gohan Collister, MD      . divalproex (DEPAKOTE ER) 24 hr tablet 500 mg  500 mg Oral Daily Curlene Labrum Maisy Newport, MD      . fluPHENAZine (PROLIXIN) tablet 10 mg  10 mg Oral QHS Curlene Labrum Audery Wassenaar, MD   10 mg at 03/28/11 2145  . fluPHENAZine (PROLIXIN) tablet 10 mg  10 mg Oral Q breakfast Shaunte Weissinger D Cayne Yom, MD      . magnesium hydroxide (MILK OF MAGNESIA) suspension 30 mL  30 mL Oral Daily PRN Mickie D. Adams, PA      . sertraline (ZOLOFT) tablet 100 mg  100 mg Oral Daily Curlene Labrum Coyle Stordahl, MD   100 mg at 03/29/11 0745  . traZODone (DESYREL) tablet 100 mg  100 mg Oral QHS PRN Curlene Labrum Uilani Sanville, MD   100 mg at 03/25/11 2315  . DISCONTD: divalproex (DEPAKOTE ER) 24 hr tablet 2,000 mg  2,000 mg Oral QHS Curlene Labrum Airiana Elman, MD   1,000 mg at 03/28/11 2145  . DISCONTD: fluPHENAZine (PROLIXIN) tablet 5 mg  5 mg Oral Q breakfast Curlene Labrum Murrel Bertram, MD   5 mg at 03/29/11 0745   Lab Results:  Results for orders placed during the hospital encounter of 03/20/11 (from the past 48 hour(s))  COMPREHENSIVE METABOLIC PANEL     Status:  Abnormal   Collection Time   03/27/11  7:50 PM      Component Value Range Comment   Sodium 138  135 - 145 (mEq/L)    Potassium 4.2  3.5 - 5.1 (mEq/L)    Chloride 101  96 - 112 (mEq/L)    CO2 30  19 - 32 (mEq/L)    Glucose, Bld 93  70 - 99 (mg/dL)    BUN 14  6 - 23 (mg/dL)    Creatinine, Ser 1.61  0.50 - 1.35 (mg/dL)    Calcium 9.4  8.4 - 10.5 (mg/dL)    Total Protein 6.6  6.0 - 8.3 (g/dL)    Albumin 3.3 (*) 3.5 - 5.2 (g/dL)    AST 15  0 - 37 (U/L)    ALT 12  0 - 53 (U/L)    Alkaline Phosphatase 45  39 - 117 (U/L)    Total Bilirubin 0.2 (*) 0.3 - 1.2 (mg/dL)    GFR calc non Af Amer >90  >90 (mL/min)    GFR calc Af Amer >90  >90 (mL/min)   CBC     Status: Normal    Collection Time   03/27/11  7:50 PM      Component Value Range Comment   WBC 5.1  4.0 - 10.5 (K/uL)    RBC 4.59  4.22 - 5.81 (MIL/uL)    Hemoglobin 13.8  13.0 - 17.0 (g/dL)    HCT 09.6  04.5 - 40.9 (%)    MCV 88.2  78.0 - 100.0 (fL)    MCH 30.1  26.0 - 34.0 (pg)    MCHC 34.1  30.0 - 36.0 (g/dL)    RDW 81.1  91.4 - 78.2 (%)    Platelets 189  150 - 400 (K/uL)   DIFFERENTIAL     Status: Normal   Collection Time   03/27/11  7:50 PM      Component Value Range Comment   Neutrophils Relative 43  43 - 77 (%)    Neutro Abs 2.2  1.7 - 7.7 (K/uL)    Lymphocytes Relative 43  12 - 46 (%)    Lymphs Abs 2.2  0.7 - 4.0 (K/uL)    Monocytes Relative 11  3 - 12 (%)    Monocytes Absolute 0.5  0.1 - 1.0 (K/uL)    Eosinophils Relative 3  0 - 5 (%)    Eosinophils Absolute 0.1  0.0 - 0.7 (K/uL)    Basophils Relative 0  0 - 1 (%)    Basophils Absolute 0.0  0.0 - 0.1 (K/uL)   VALPROIC ACID LEVEL     Status: Abnormal   Collection Time   03/27/11  7:50 PM      Component Value Range Comment   Valproic Acid Lvl 43.6 (*) 50.0 - 100.0 (ug/mL)    The patient was seen today and reports to sleeping well without difficulty.  He also states that his depression is slightly improved with no suicidal or homicidal ideations.  He states that his auditory hallucinations "come and go" depending on the situation.  He continues to express concerns about the location of his house and is planning to stay in a hotel at discharge.  Treatment Plan Summary:  1. Daily contact with patient to assess and evaluate symptoms and progress in treatment  2. Medication management  3. The patient will deny suicidal ideations or homicidal ideations for 48 hours prior to discharge and have a depression and anxiety rating of 3 or  less. The patient will also deny any auditory or visual hallucinations or delusional thinking.   Plan:  1. Will continue current medications.  2. Will increase the medication Prolixin to 10 mgs po q am and 10 mgs po  qhs for psychosis.  3. Will continue the medication Zoloft to 100 mgs po q am for depression.  4. Will change Depakote ER to 500 mgs po q am and 1500 mgs po qhs for mood stabilization. 5. Laboratory studies reviewed.  6. Will continue to monitor.   Avryl Roehm 03/29/2011, 3:56 PM

## 2011-03-29 NOTE — Progress Notes (Signed)
Currently resting quietly in bed in left lateral position with eyes closed. Respirations are even and unlabored. No acute pain/discomfort/ditress noted. Safety has been maintained with Q15 minute observation. Will continue current POC.

## 2011-03-29 NOTE — Progress Notes (Signed)
Patient reports feeling better, less voices. In bed most of the day and evening. Still responsding to internal stimuli and talks to self. He continues to isolates to his room. Patient is non aggressive, thought process is tangential. Q 15 minute check continues to maintain safety.

## 2011-03-30 NOTE — Progress Notes (Signed)
03/30/2011         Time: 0930      Group Topic/Focus: The focus of this group is on discussing various aspects of wellness, balancing those aspects and exploring ways to increase the ability to experience wellness.  Participation Level: Minimal  Participation Quality: Attentive  Affect: Blunted  Cognitive: Oriented   Additional Comments: Patient quiet, chimes in at times and participates appropriately, but mostly sits quietly.   Jahfari Ambers 03/30/2011 1:18 PM

## 2011-03-30 NOTE — Progress Notes (Addendum)
Patient in bed for most part of the evening. He reports doing better, feels his medications helping him. Thought process not very clear. Writer unable to engage patient in conversation, he answers most question with yes/no. He denies SI/HI and denies hallucinations. Mood and affect blunted and flat. No interaction noted between patient and peers. He continues to isolates in his room. Q 15 minute check continues to maintain safety. Patient took his shower today.

## 2011-03-30 NOTE — Progress Notes (Signed)
Currently resting quietly in bed in left lateral position with eyes closed. Respirations are even and unlabored. No acute distress noted. Safety has been maintained with Q15 minute observation. Will continue current POC. 

## 2011-03-30 NOTE — Progress Notes (Signed)
[facility] MD Progress Note  03/30/2011 1:23 PM  Diagnosis:  Axis I: Schizophrenia - Paranoid Type.   The patient was seen today and reports the following:   ADL's: Intact.  Sleep: The patient reports to continuing to sleep well at night.  Appetite: The patient reports a good appetite today.   Mild>(1-10) >Severe  Hopelessness (1-10): 0  Depression (1-10): 8  Anxiety (1-10): 0   Suicidal Ideation: The patient denied any suicidal ideations today.  Plan: No  Intent: No  Means: No   Homicidal Ideation: The patient adamantly denies any homicidal ideations today.  Plan: No  Intent: No.  Means: No   General Appearance/Behavior: Appropriate and cooperative today.  He continues to appear to be internally distracted.  Eye Contact: Good.  Speech: Appropriate in rate and volume today with no pressuring noted.  Motor Behavior: wnl.  Level of Consciousness: Alert and Oriented x 3.  Mental Status: Alert and Oriented x 3.  Mood: Severely depressed.  Affect: Moderately constricted.  Anxiety Level: No anxiety reported today.  Thought Process: Appears internally preoccupied.  Thought Content: The patient denies any visual hallucinations today or any delusional thinking. He states that his auditory hallucinations "come and go" depending on the situation and have "not woken up yet this morning." Perception:. Auditory hallucinations continue but "come and go."  Judgment: Fair.  Insight: Fair.  Cognition: Oriented to person, place and time.  Sleep:  Number of Hours: 5    Vital Signs:Blood pressure 102/69, pulse 103, temperature 98.9 F (37.2 C), temperature source Oral, resp. rate 18, height 5\' 11"  (1.803 m), weight 83.462 kg (184 lb), SpO2 98.00%.  Current Medications: Current Facility-Administered Medications  Medication Dose Route Frequency Provider Last Rate Last Dose  . acetaminophen (TYLENOL) tablet 650 mg  650 mg Oral Q6H PRN Mickie D. Adams, PA   650 mg at 03/28/11 0802  . alum & mag  hydroxide-simeth (MAALOX/MYLANTA) 200-200-20 MG/5ML suspension 30 mL  30 mL Oral Q4H PRN Mickie D. Adams, PA   30 mL at 03/28/11 2015  . benztropine (COGENTIN) tablet 1 mg  1 mg Oral BH-qamhs Curlene Labrum Brinlee Gambrell, MD   1 mg at 03/30/11 0807  . divalproex (DEPAKOTE ER) 24 hr tablet 1,500 mg  1,500 mg Oral QHS Curlene Labrum Lucia Mccreadie, MD   1,500 mg at 03/29/11 2134  . divalproex (DEPAKOTE ER) 24 hr tablet 500 mg  500 mg Oral Daily Curlene Labrum Brayon Bielefeld, MD   500 mg at 03/30/11 0808  . fluPHENAZine (PROLIXIN) tablet 10 mg  10 mg Oral QHS Curlene Labrum Jing Howatt, MD   10 mg at 03/29/11 2134  . fluPHENAZine (PROLIXIN) tablet 10 mg  10 mg Oral Q breakfast Curlene Labrum Braelon Sprung, MD   10 mg at 03/30/11 1610  . magnesium hydroxide (MILK OF MAGNESIA) suspension 30 mL  30 mL Oral Daily PRN Mickie D. Adams, PA      . sertraline (ZOLOFT) tablet 100 mg  100 mg Oral Daily Curlene Labrum Mayerly Kaman, MD   100 mg at 03/30/11 0808  . traZODone (DESYREL) tablet 100 mg  100 mg Oral QHS PRN Curlene Labrum Alina Gilkey, MD   100 mg at 03/25/11 2315  . DISCONTD: divalproex (DEPAKOTE ER) 24 hr tablet 2,000 mg  2,000 mg Oral QHS Curlene Labrum Amandy Chubbuck, MD   1,000 mg at 03/28/11 2145  . DISCONTD: fluPHENAZine (PROLIXIN) tablet 5 mg  5 mg Oral Q breakfast Curlene Labrum Zakk Borgen, MD   5 mg at 03/29/11 0745   Lab Results:  No results found for this or any previous visit (from the past 48 hour(s)).  The patient was seen today and reports to sleeping well without difficulty. He states that he has not heard any auditory hallucinations so far this morning but states "they are not awake yet."  He continues to state that he needs to locate his house and hope he can find a locksmith to open the door to the house once he finds it.  He continues to state that he plans to live in a hotel at discharge until his house can be located.  He denies any medication side effects today or other concerns.  Treatment Plan Summary:  1. Daily contact with patient to assess and evaluate symptoms and progress in  treatment  2. Medication management  3. The patient will deny suicidal ideations or homicidal ideations for 48 hours prior to discharge and have a depression and anxiety rating of 3 or less. The patient will also deny any auditory or visual hallucinations or delusional thinking.   Plan:  1. Will continue current medications.  2. Laboratory studies reviewed.  3. Will order a repeat serum Depakote Level for tonight. 4. No medication changes today since the patient's Prolixin dosage was increased this morning. 5. Will continue to monitor.   Marieanne Marxen 03/30/2011, 1:23 PM

## 2011-03-30 NOTE — Progress Notes (Signed)
[facility] Group Notes:  (Counselor/Nursing/MHT/Case Management/Adjunct)  03/30/2011 9:11 AM  Type of Therapy:  Group Therapy  Participation Level:  Minimal  Participation Quality:  Attentive and Sharing  Affect:  Depressed  Cognitive:  Hallucinating  Insight:  Limited  Engagement in Group:  Limited  Engagement in Therapy:  Limited  Modes of Intervention:  Education, Problem-solving and Support  Summary of Progress/Problems: Patient continued to hallucinate in group but less so. He stated that he would like to get a job but wants to work out his housing first.   Veto Kemps 03/30/2011, 9:11 AM

## 2011-03-30 NOTE — Progress Notes (Signed)
Pt reports passive auditory hallucinations daily and denies at the current time. Pt reports that voices yesterday said "Let's get going."  Pt is sleepy coming to medication window to receive morning medication and reports that he ate breakfast and slept well. Gave medication, support and 15 minute checks as ordered. Safety maintained on unit.

## 2011-03-31 NOTE — Progress Notes (Signed)
Patient up and took medications this morning.  He states that the medication makes him really sleepy and he is not able to attend morning groups.  By lunchtime he was up and interacting with staff and peers.  He denies suicidal ideation today.

## 2011-03-31 NOTE — Progress Notes (Signed)
Patient ID: Javeon Macmurray, male   DOB: 1975-08-20, 36 y.o.   MRN: [id number] Kirsten has been staying in his room most of the day yesterday and is in his room today. He is fully awake although he is lying in bed with his eyes closed. He immediately answers me. Speech is nonpressured. He reports: "I feel better today." He says he is sleeping well, has been up to all meals and appetite is good. He says he is hearing no auditory hallucinations. He denies suicidal thoughts.  His primary concern today is finding his house key. He describes to me, again, is fixed delusion that he has purchased a house and that he cannot find it. He cannot state the address or even an approximate location for this house. It is possible he really did own a house at one point, but he is not able to give any specific information at this time. 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 [place] did build a new jail several years ago and it is possible that he actually work on this project.  He is calm with excellent eye contact and non-pressured speech.  He denies that he is feeling sedated from medications, and perceives no side effects.  Denies problems with constipation or urinary retention.  Says he has no interest in group therapy today.    Plan: Continue current plan.  VPA level scheduled for this evening.

## 2011-03-31 NOTE — Progress Notes (Signed)
Pt Refused lab Draw.

## 2011-03-31 NOTE — Progress Notes (Signed)
[facility] Group Notes:  (Counselor/Nursing/MHT/Case Management/Adjunct)  03/31/2011 2:20 PM  Type of Therapy:  Group Therapy  Participation Level:  Active  Participation Quality:  Attentive  Affect:  Blunted  Cognitive:  Oriented  Insight:  Limited  Engagement in Group:  Good  Engagement in Therapy:  Good  Modes of Intervention:  Clarification, Education and Support  Summary of Progress/Problems: Patient talked about his medications making him sleepy after he takes his Prolixin. Takes a long time to get woken up. Feels he is doing better with medications and states he will continue them when he leaves.   Lennard Capek, Aram Beecham 03/31/2011, 2:20 PM

## 2011-03-31 NOTE — Progress Notes (Signed)
[facility] Group Notes:  (Counselor/Nursing/MHT/Case Management/Adjunct)  03/31/2011 2:19 PM  Type of Therapy:  Psychoeducational Skills 3/27  Participation Level:  Did Not Attend  Erik [name] 03/31/2011, 2:19 PM

## 2011-04-01 NOTE — Progress Notes (Signed)
Pt did not fill out his inventory today. Is withdrawn and stays to himself. Did attend some of the groups and meals. Otherwise stays to himself, not initating conversations. Did not fill out his self inventory today. States that he is not hearing voices. Given support.

## 2011-04-01 NOTE — Discharge Planning (Signed)
Met with patient in Aftercare Planning Group.   Suggested to him that we feel he really would benefit from ACTT services, and he did agree at this time.  Did referral to [facility].  Erik Mantle, LCSW 04/01/2011, 2:15 PM

## 2011-04-01 NOTE — Discharge Planning (Signed)
Met with patient in Aftercare Planning Group.   He was calmer than previously, as well as less sedated than has been over last several days.  Patient stated he will be ready for discharge on Monday 04/04/11, and Case Manager asked if he would have his check at that point and be able to go to a hotel.  He confirmed that this would be his plan at this point.  Case Manager emphasized to him again that we feel strongly he should have an ACT Team, but he has not been in agreement with that plan.  He stated that if we feel that is best, he would be willing to have a referral to ACTT; however, he has already tried [facility], [facility] and [facility] and would not wish to return to any of those.  Case Manager initiated referral to [facility].  Ambrose Mantle, LCSW 04/01/2011, 10:07 AM

## 2011-04-01 NOTE — Progress Notes (Signed)
[facility] Group Notes:  (Counselor/Nursing/MHT/Case Management/Adjunct)  04/01/2011 10:10 AM  Type of Therapy:  Group Therapy  Participation Level:  Did Not Attend   Erik [name] 04/01/2011, 10:10 AM

## 2011-04-01 NOTE — Progress Notes (Addendum)
Patient ID: Erik [name], male   DOB: [birth year], 36 y.o.   MRN: [id number]  Erik [name] presents fully alert, he has been up in group today. He reports that his appetite is "all right". This is the second in a road that he has stated that he has no more auditory hallucinations. He describes his sleep as "pretty good." He says his depression is almost completely gone scores 1/10 on a 1-10 scale if 10 is the worst symptoms. Anxiety is scored 0/10, hopelessness 0/10. He denies any suicidal thoughts and said these have been gone for a couple of days now.  This is the first day but had not been able to elicit delusional thinking from [facility]. I have asked him if there is anything on his mind, and typically he will tell me that he has concerns about finding his house, which as far as we know does not exist. Today he says he has no concerns on his mind.  He says that he plans to get himself a hotel room, as soon as he receives his check for the month that comes on Monday.    Objective: Hygiene adequate, affect appropriate, good eye contact. He is fully oriented to person place situation and time frames. No evidence of delusional thinking, no dangerous thoughts. No evidence of delusional thinking today.  Plan: Discharge planning, estimated length of stay 2 days. We'll consider discharge on Monday if stable.  1500:   Pulse is noted to be 130 this morning with the v/s machine.  However, I have checked his apical pulse myself now, it is 88/minute, regular, with no extra sounds.  Erik [name] denies any feeling of palpitations, dizziness with postural changes, or sense of weakness or racing heart beat.

## 2011-04-01 NOTE — Tx Team (Signed)
Interdisciplinary Treatment Plan Update (Adult)  Date:  04/01/2011  Time Reviewed:  10:15AM-11:00AM  Progress in Treatment: Attending groups:  Some Participating in groups:    When called on Taking medication as prescribed:    Yes Tolerating medication:   Yes Family/Significant other contact made:  No Patient understands diagnosis:   Yes, poor insight & judgment Discussing patient identified problems/goals with staff:   Yes Medical problems stabilized or resolved:   Yes Denies suicidal/homicidal ideation:  Yes Issues/concerns per patient self-inventory:   None Other:    New problem(s) identified: No, Describe:    Reason for Continuation of Hospitalization: Anxiety Delusions  Depression Hallucinations Medication stabilization Other; describe Needs placement/services to be arranged  Interventions implemented related to continuation of hospitalization:  Medication monitoring and adjustment, safety checks Q15 min., suicide risk assessment, group therapy, psychoeducation, collateral contact, aftercare planning, ongoing physician assessments, medication education  Additional comments:  Not applicable  Estimated length of stay:  3-4 days  Discharge Plan:  Patient wants to D/C Monday to go live in a hotel, follow up with [facility] in [facility].  New goal(s):  Not applicable  Review of initial/current patient goals per problem list:   1.  Goal(s):  Medication stabilization  Met:  Yes  Target date:  By Discharge   As evidenced by:  Still working on stabilization  2.  Goal(s):  Housing is needed, as patient will not take medications at shelter or on street.  Met:  No  Target date:  By Discharge   As evidenced by:  Pt does not yet have housing, but believes he will go to a hotel to stay at discharge.  3.  Goal(s):  Reduce depression and anxiety from 7 and 8 at 4th day to no more than 3 at discharge.  Met:  No  Target date:  By Discharge   As evidenced by:  Does not give  numbers today, but it is above 7.  4.  Goal(s):  Determine follow-up.  Met:  Yes  Target date:  By Discharge   As evidenced by:  Patient finally consented to referral to ACTT -- referral was sent to [facility]  5. Goal(s): Reduce auditory hallucinations and delusions to baseline.  Met: No  Target date: By Discharge  As evidenced by: Seems to be somewhat better.  Attendees: Patient:  Did not attend   Family:     Physician:  Dr. Harvie Heck Readling 04/01/2011 10:15AM-11:00AM  Nursing:   Lamount Cranker, RN 04/01/2011 10:15AM -11:00AM   Case Manager:  Ambrose Mantle, LCSW 04/01/2011 10:15AM-11:00AM  Counselor:  Veto Kemps, MT-BC 04/01/2011 10:15AM-11:00AM  Other:   Lynann Bologna, NP 04/01/2011 10:15AM-11:00AM  Other:      Other:      Other:       Scribe for Treatment Team:   Sarina Ser, 04/01/2011, 10:15AM-11:15AM

## 2011-04-01 NOTE — Progress Notes (Signed)
Patient ID: Erik [name], male   DOB: 03/07/1975, 36 y.o.   MRN: [id number] Pt. In bed and sleeping for most of the evening shift.  Up briefly to eat dinner.  Did not attend evening wrap-up group.  Patient denies SI/HI/AVH.  Reviewed nursing plan of care and offered support as needed.  Patient verbalized understanding.

## 2011-04-01 NOTE — Progress Notes (Signed)
Patient ID: Erik [name], male   DOB: 1975/06/17, 36 y.o.   MRN: [id number] Pt has been resting well tonight, eyes closed, resp reg, unlabored, no c/o's voiced.  Will continue to monitor q 15 min for safety.

## 2011-04-02 NOTE — Progress Notes (Signed)
Patient ID: Erik [name], male   DOB: 06-20-1975, 36 y.o.   MRN: [id number]   Reports no new acute problems. Per pt he slept well. Able to eat and sleep well now. meds are helping at this time.    Objective:  Hygiene fair, affect appropriate, good eye contact. He is fully oriented to person place situation and time frames. No evidence of delusional thinking, no dangerous thoughts. No evidence of delusional thinking at this time.   Dx: schizophrenia  Plan:    Continue current meds.

## 2011-04-02 NOTE — Progress Notes (Signed)
Patient ID: Erik [name], male   DOB: 06/16/1975, 36 y.o.   MRN: [id number] Has been sleeping tonight , eyes closed, resp reg, unlabored, noc/o's voiced.  Will continue to monitor.

## 2011-04-02 NOTE — Progress Notes (Signed)
Patient ID: Erik [name], male   DOB: December 16, 1975, 36 y.o.   MRN: [id number]  [facility] Group Notes:  (Counselor/Nursing/MHT/Case Management/Adjunct)  04/02/2011 11 AM  Type of Therapy:  Group Therapy, Dance/Movement Therapy   Participation Level:  Did Not Attend     Rhunette Croft

## 2011-04-02 NOTE — Progress Notes (Signed)
Pt isolates to his room and interacts very minimally with others   He rarely initiates conversation or interaction with others   He continues to endorse voices but said he was feeling some better   His appetite is good and sleep is fair  Though he does say he doesn't sleep well at night   Verbal support given  Medications administered and effectivenesss monitored  Encourage socialization    Q 15 min checks   Pt safe at present

## 2011-04-03 NOTE — Progress Notes (Signed)
Patient has been isolative to his room. He reports that his he still hears voices but they are not as bad as they were when he first came in and he feels that his medications are working. Patient currently denies having pain, -si/hi and - visual hallucinations. Patient informed that there were no changes to his medications and patient is agreeable to taking them.  Patient was supported and encouraged, safety maintained on unit, will continue to monitor.

## 2011-04-03 NOTE — Progress Notes (Signed)
Patient ID: Erik [name], male   DOB: 11/01/1975, 35 y.o.   MRN: [id number]   Was seen sleeping in his bed room. Could not get up earlier today to get his AM meds. Reports no new acute problems. Per pt he slept well. Able to eat and sleep well now. meds are helping at this time. Thinks he is in good mood today. Says he was tired when he came here and this was the reason for his admission. Feels less tired now.    Objective:  Hygiene fair, affect appropriate, poor eye contact. He is fully oriented to person place situation and time frames. No evidence of delusional thinking, no dangerous thoughts. No evidence of delusional thinking at this time. logical   Dx: schizophrenia  Plan:    Continue current meds.

## 2011-04-03 NOTE — Progress Notes (Signed)
[facility] Group Notes:  (Counselor/Nursing/MHT/Case Management/Adjunct)  04/03/2011 11 AM  Type of Therapy:  Group Therapy, Dance/Movement Therapy   Participation Level:  Did Not Attend   Erik [name]  

## 2011-04-03 NOTE — Progress Notes (Signed)
Spoke with assigned nurse Maureen Chatters RN who reports that  Pt .is no longer exhibiting inappropriate sexual behaviors. Patient may have roommate and order was obtained by telephone from Dr. Theotis Barrio at [phone]. Theodoro Kos RN [facility]

## 2011-04-03 NOTE — Progress Notes (Signed)
Pt isolated to his room all morning  It was requested he come to the medication window to get medications but he failed to do so when first asked  It took several requests to get the pt to wake up and take his medications  He is dishelved and is depressed   Verbal support given  Medications administered and effectiveness monitored  Q 15 min checks  Encouraged bathing and socialization   Pt safe at present but has not bathed and remains in his room

## 2011-04-04 NOTE — Progress Notes (Signed)
[facility] MD Progress Note  04/04/2011 4:52 PM  Diagnosis:  Axis I: Schizophrenia - Paranoid Type.   The patient was seen today and reports the following:   ADL's: Intact.  Sleep: The patient reports to continuing to sleep well at night.  Appetite: The patient reports a good appetite today.   Mild>(1-10) >Severe  Hopelessness (1-10): 0  Depression (1-10): 0  Anxiety (1-10): 0   Suicidal Ideation: The patient denies any suicidal ideations today.  Plan: No  Intent: No  Means: No   Homicidal Ideation: The patient adamantly denies any homicidal ideations today.  Plan: No  Intent: No.  Means: No   General Appearance/Behavior: Appropriate and cooperative today.  Eye Contact: Good.  Speech: Appropriate in rate and volume today with no pressuring noted.  Motor Behavior: wnl.  Level of Consciousness: Alert and Oriented x 3.  Mental Status: Alert and Oriented x 3.  Mood: Euthymic Today.  Affect: Mildly constricted.  Anxiety Level: No anxiety reported today.  Thought Process: wnl.  Thought Content: The patient denies any auditory or visual hallucinations today as well as any delusional thinking. Perception:. wnl Judgment: Fair to Good.  Insight: Fair to Good.  Cognition: Oriented to person, place and time.  Sleep:  Number of Hours: 6.75    Vital Signs:Blood pressure 94/66, pulse 101, temperature 98.3 F (36.8 C), temperature source Oral, resp. rate 18, height 5\' 11"  (1.803 m), weight 83.462 kg (184 lb), SpO2 98.00%.  Current Medications: Current Facility-Administered Medications  Medication Dose Route Frequency Provider Last Rate Last Dose  . acetaminophen (TYLENOL) tablet 650 mg  650 mg Oral Q6H PRN Mickie D. Adams, PA   650 mg at 03/28/11 0802  . alum & mag hydroxide-simeth (MAALOX/MYLANTA) 200-200-20 MG/5ML suspension 30 mL  30 mL Oral Q4H PRN Mickie D. Adams, PA   30 mL at 03/28/11 2015  . benztropine (COGENTIN) tablet 1 mg  1 mg Oral BH-qamhs Curlene Labrum Mehr Depaoli, MD   1 mg at  04/04/11 0751  . divalproex (DEPAKOTE ER) 24 hr tablet 1,500 mg  1,500 mg Oral QHS Curlene Labrum Keldric Poyer, MD   1,500 mg at 04/03/11 2229  . divalproex (DEPAKOTE ER) 24 hr tablet 500 mg  500 mg Oral Daily Curlene Labrum Nehemias Sauceda, MD   500 mg at 04/04/11 0752  . fluPHENAZine (PROLIXIN) tablet 10 mg  10 mg Oral QHS Curlene Labrum Yuma Blucher, MD   10 mg at 04/03/11 2232  . fluPHENAZine (PROLIXIN) tablet 10 mg  10 mg Oral Q breakfast Curlene Labrum Mabry Tift, MD   10 mg at 04/04/11 0751  . magnesium hydroxide (MILK OF MAGNESIA) suspension 30 mL  30 mL Oral Daily PRN Mickie D. Adams, PA      . sertraline (ZOLOFT) tablet 100 mg  100 mg Oral Daily Curlene Labrum Dajsha Massaro, MD   100 mg at 04/04/11 0752  . traZODone (DESYREL) tablet 100 mg  100 mg Oral QHS PRN Ronny Bacon, MD   100 mg at 04/03/11 2231   Lab Results: No results found for this or any previous visit (from the past 48 hour(s)).  The patient was seen today and reports to sleeping well without difficulty. He also denies any depressive symptoms and states today that he is not having any auditory or visual hallucinations.  He also denies any delusional thinking.  The patient states that when discharged he plans to live in a Hotel and follow up with [facility].  He denies any medication side effects today with the exception  of some daytime somnolence.  Treatment Plan Summary:  1. Daily contact with patient to assess and evaluate symptoms and progress in treatment  2. Medication management  3. The patient will deny suicidal ideations or homicidal ideations for 48 hours prior to discharge and have a depression and anxiety rating of 3 or less. The patient will also deny any auditory or visual hallucinations or delusional thinking.   Plan:  1. Will continue current medications.  2. Laboratory studies reviewed.  3. Will continue to monitor.  4. Possible discharge in the morning.  Jassmin Kemmerer 04/04/2011, 4:52 PM

## 2011-04-04 NOTE — Tx Team (Signed)
Interdisciplinary Treatment Plan Update (Adult)  Date:  04/04/2011  Time Reviewed:  10:15AM-11:00AM  Progress in Treatment: Attending groups:  Some Participating in groups:    No, mostly sleeping Taking medication as prescribed:    Yes Tolerating medication:   Yes, but overly sedated Family/Significant other contact made:  No, refuses Patient understands diagnosis:   Yes Discussing patient identified problems/goals with staff:   Yes Medical problems stabilized or resolved:   Yes Denies suicidal/homicidal ideation:  Yes Issues/concerns per patient self-inventory:   None Other:    New problem(s) identified: No, Describe:    Reason for Continuation of Hospitalization: Medication stabilization Other; describe overly sedated in the morning  Interventions implemented related to continuation of hospitalization:  Medication monitoring and adjustment, safety checks Q15 min., suicide risk assessment, group therapy, psychoeducation, collateral contact, aftercare planning, ongoing physician assessments, medication education  Additional comments:  Not applicable  Estimated length of stay:  1-2 days  Discharge Plan:  Go to hotel, go to [facility] for follow-up until [facility] assesses  New goal(s):  Not applicable  Review of initial/current patient goals per problem list:   1.  Goal(s):  Medication stabilizaiton  Met:  No  Target date:  By Discharge   As evidenced by:  Still sleepy in the morning  2.  Goal(s): Housing is needed, as patient will not take medications at shelter or on street  Met:  Yes  Target date:  By Discharge   As evidenced by:  Thinks will go to a hotel at discharge, check has come in  3.  Goal(s):   Reduce depression and anxiety from 7 and 8 at 4th day to no more than 3 at discharge.  Met:  Yes  Target date:  By Discharge   As evidenced by:  Denies depression today  4.  Goal(s):  Determine follow-up.  Met:  No  Target date:  By Discharge   As evidenced  by:  Referral has been made to [facility], and until that goes into effect, would go to [facility]  5.  Goal(s):  Reduce auditory hallucinations and delusions to baseline.  Met: Yes  Target date:  By Discharge   As evidenced by:  Denies  Attendees: Patient:  Erik [name]  04/04/2011 10:15AM-11:00AM  Family:     Physician:  Dr. Harvie Heck Readling 04/04/2011 10:15AM-11:00AM  Nursing:   Barrie Folk, RN 04/04/2011 10:15AM -11:00AM   Case Manager:  Ambrose Mantle, LCSW 04/04/2011 10:15AM-11:00AM  Counselor: Marni Griffon, RN 04/04/2011 10:15AM-11:00AM  Other:   Nanine Means, RN 04/04/2011 10:15AM-11:00AM  Other:      Other:      Other:       Scribe for Treatment Team:   Sarina Ser, 04/04/2011, 10:15AM-11:15AM

## 2011-04-04 NOTE — Progress Notes (Signed)
[facility] Group Notes:  (Counselor/Nursing/MHT/Case Management/Adjunct)  04/04/2011 2:11 PM  Type of Therapy:  Group Therapy  Participation Level:  Did Not Attend   Summary of Progress/Problems:  Pt did not attend group.   Marni Griffon C 04/04/2011, 2:11 PM

## 2011-04-04 NOTE — Progress Notes (Signed)
04/04/2011         Time: 0930      Group Topic/Focus: The focus of this group is on discussing various styles of communication and communicating assertively using 'I' (feeling) statements.  Participation Level: None  Participation Quality: Drowsy  Affect: Depressed  Cognitive: Oriented  Additional Comments: Patient usually participated more in group, patient slept on the couch the majority of group.   Erik [name] 04/04/2011 3:40 PM

## 2011-04-05 MED ORDER — TRAZODONE HCL 100 MG PO TABS: 100.0000 mg | ORAL_TABLET | Freq: Every evening | ORAL | Status: DC | PRN

## 2011-04-05 MED ORDER — FLUPHENAZINE HCL 10 MG PO TABS: ORAL_TABLET | ORAL | Status: DC

## 2011-04-05 MED ORDER — SERTRALINE HCL 100 MG PO TABS: 100.0000 mg | ORAL_TABLET | Freq: Every day | ORAL | Status: DC

## 2011-04-05 MED ORDER — DIVALPROEX SODIUM ER 500 MG PO TB24: ORAL_TABLET | ORAL | Status: DC

## 2011-04-05 MED ORDER — BENZTROPINE MESYLATE 1 MG PO TABS: 1.0000 mg | ORAL_TABLET | ORAL | Status: DC

## 2011-04-05 NOTE — Progress Notes (Signed)
Patient discharged today.  Reviewed all discharge instructions, medications, and follow up care.  Patient verbalized understanding of all.  Patient was given a two week supply of medications from the hospital pharmacy.  He was also given 2 bus passes to get to his hotel and to get to the mental health center.  He has been compliant with his medications and understands what he is on and the doses he is taking at this time.  All belongings collected from his room and retrieved from locker number 122.  Patient left the unit ambulatory and was escorted to the lobby.  He left the building to walk over to the bus stop.

## 2011-04-05 NOTE — Progress Notes (Signed)
[facility] Case Management Discharge Plan:  Will you be returning to the same living situation after discharge: No.Is going to a new hotel At discharge, do you have transportation home?:Yes,  Case Manager gave patient a bus ticket to get to the hotel as well as to get to his first visit at [facility] Do you have the ability to pay for your medications:Yes,  Has disability income and insurance  Interagency Information:     Release of information consent forms completed and in the chart;  Patient's signature needed at discharge.  Patient to Follow up at:  Follow-up Information    Follow up with Leeroy Cha on 04/11/2011. (9:00AM appointment to have Comprehensive  Clinical Assessment to be evaluated for ACTT services)    Contact information:   [facility] [address] [place] Telephone:  [phone]        Follow up with [facility] on 04/07/2011. [facility] is open Monday-Friday 8AM-3PM.  Go earlier in the day to be seen as soon as possible.)    Contact information:   [address] [place]  Telephone:  [phone]         Patient denies SI/HI:   Yes,      Safety Planning and Suicide Prevention discussed:  Yes,  During Aftercare Planning Group throughout patient's stay, Case Manager provided psychoeducation on "Suicide Prevention Information."  This included descriptions of risk factors for suicide, warning signs that an individual is in crisis and thinking of suicide, and what to do if this occurs.  Pt indicated understanding of information provided, and will read brochure given upon discharge.     Barrier to discharge identified:No.  Summary and Recommendations:  Patient has been with 3 previous ACTT teams, agrees to assessment with [facility].  Really would benefit from ACTT.  During this hospitalization doctor completed paperwork for patient, which we sent to [employer], stating that he needs a  payee.  We also recommend that he be evaluated as to capacity, and possibly be assigned a guardian.   Sarina Ser 04/05/2011, 10:59 AM

## 2011-04-05 NOTE — Tx Team (Signed)
Interdisciplinary Treatment Plan Update (Adult)  Date:  04/05/2011  Time Reviewed:  10:15AM-11:00AM  Progress in Treatment: Attending groups:  Rarely Participating in groups:    No, just sits through them or sleeps Taking medication as prescribed:    Yes Tolerating medication:   Yes Family/Significant other contact made:  No Patient understands diagnosis:   Yes, but has poor judgment Discussing patient identified problems/goals with staff:   Yes Medical problems stabilized or resolved:   Yes Denies suicidal/homicidal ideation:  Yes Issues/concerns per patient self-inventory:   None Other:    New problem(s) identified: No, Describe:    Reason for Continuation of Hospitalization: None  Interventions implemented related to continuation of hospitalization:  Medication monitoring and adjustment, safety checks Q15 min., suicide risk assessment, group therapy, psychoeducation, collateral contact, aftercare planning, ongoing physician assessments, medication education  Additional comments:  Not applicable  Estimated length of stay:  Discharge today  Discharge Plan:  Go to [facility], follow up at [facility] until Comprehensive Clinical Assessment with [facility], hopefully will start ACTT services.  New goal(s):  Not applicable  Review of initial/current patient goals per problem list:   1.  Goal(s):  Medication stabilization  Met:  Yes  Target date:  By Discharge   As evidenced by:  Stable on current medication  2.  Goal(s):  Goals #2 and #3 and #5 previously met  Met:  Yes  Target date:  By Discharge   As evidenced by:  See yesterday's treatment team update  3.  Goal(s):  Determine follow-up.  Met:  Yes  Target date:  By Discharge   As evidenced by:  Follow-up appointment set to be evaluated for [facility]; until then will go to [facility]    Attendees: Patient:  Erik [name]  04/05/2011 10:15AM-11:00AM  Family:     Physician:  Dr. Harvie Heck Readling 04/05/2011 10:15AM-11:00AM    Nursing:   Izola Price, RN 04/05/2011 10:15AM -11:00AM   Case Manager:  Ambrose Mantle, LCSW 04/05/2011 10:15AM-11:00AM  Counselor:  Marni Griffon, LCAS 04/05/2011 10:15AM-11:00AM  Other:      Other:      Other:      Other:       Scribe for Treatment Team:   Sarina Ser, 04/05/2011, 10:15AM-11:15AM

## 2011-04-05 NOTE — Progress Notes (Signed)
Has been isolative this shift. Did come out to request his HS medications at 2100, but was asked to come back at 2130 to which he agreed. States he fells like he is ready for D/C. States he intends to stay in a motel and be followed by an ACTT team. He is agreeable to this plan and is hopeful the ACTT will be able to help him maintain. Support and encouragement provided. Offered no questions or concerns. Denied SI/HI/AVH and contracted for safety. POC and medications for the shift were reviewed and understanding verbalized. Safety has been maintained with Q15 minute observation. Will continue current POC.

## 2011-04-05 NOTE — Discharge Summary (Signed)
Physician Discharge Summary Note  Patient:  Erik [name] is an 36 y.o., male MRN:  [id number] DOB:  21-Apr-1975 Patient phone:  [phone] (home)  Patient address:   [address] [place] [address],   Date of Admission:  03/20/2011 Date of Discharge: 04/05/11  Reason for Admission: Psychotic symptoms  Discharge Diagnoses: Principal Problem:  *Schizophrenia, paranoid type   Axis Diagnosis:   AXIS I:  Chronic Paranoid Schizophrenia AXIS II:  Deferred AXIS III:   Past Medical History  Diagnosis Date  . Schizoaffective disorder   . Depression   . Suicidal thoughts   . Anxiety    AXIS IV:  economic problems, educational problems, housing problems, occupational problems, other psychosocial or environmental problems, problems with access to health care services and problems with primary support group AXIS V:  65  Level of Care:  OP  Hospital Course: First presented to ED on 3/10 due to not having his meds- was discharged represented 3/14 same presentation and again discharged. 3/16 says "i'm a schizophrenic man and I need a place to stay.  Says that he heard voices all his life but once he lost employment in 2009 he finally went for "help". Doesn't get enough money to cover rent food and his meds.  When discharged in September he had follow up with [facility] [phone].  Today just wants [place] to get him a small house to live in.  While a patient in this hospital, Erik [name] received medication management as well as group counseling and activities. He participated in group activities some together with his medication regimen showed gradual improvement in his symptoms on daily basis. He tolerated his medication regimen without any significant adverse effects and or reactions. He also was medically monitored closely by way of laboratory studies and findings. And because patient is known to be noncompliant with his treatment regimen, he is being referred to the ACTT  team to be followed and monitored regularly/closely to maintain stability and prevent further frequent hospitalizations except when necessary. Patient adamantly denies any suicidal, homicidal ideations, auditory and or visual hallucinations upon discharge.  Patient is currently being discharged to his place of residence. He will continue psychiatric care on an outpatient basis at [facility] on 04/07/11. Patient is instructed and made to understand that this is a walk-in appointment between the hours of 08:00 am and 03:00 pm. And for the ACTT enrollment comprehensive assessment, patient is to meet with Erik [name] on 04/11/11 at 09:00 am. The address, dates and times for these appointments provided for patient. Patient also received 2 weeks worth samples of his discharged medications and a bus ticket for the trip to his place of residence. Patient left [facility] facility with all personal belongings in no apparent distress.     Consults:  None  Significant Diagnostic Studies:  labs: CBC panel, Chem. panel  Discharge Vitals:   Blood pressure 96/65, pulse 100, temperature 97.2 F (36.2 C), temperature source Oral, resp. rate 20, height 5\' 11"  (1.803 m), weight 83.462 kg (184 lb), SpO2 98.00%.  Mental Status Exam: See Mental Status Examination and Suicide Risk Assessment completed by Attending Physician prior to discharge.  Discharge destination:  Home  Is patient on multiple antipsychotic therapies at discharge:  No   Has Patient had three or more failed trials of antipsychotic monotherapy by history:  No  Recommended Plan for Multiple Antipsychotic Therapies: NA   Medication List  As of 04/05/2011  4:16 PM   STOP taking  these medications         haloperidol 0.5 MG tablet         TAKE these medications      Indication    benztropine 1 MG tablet   Commonly known as: COGENTIN   Take 1 tablet (1 mg total) by mouth 2 (two) times daily in the am and at bedtime.. For involuntary movements        divalproex 500 MG 24 hr tablet   Commonly known as: DEPAKOTE ER   For mood control.      Take 1 tablet daily, and 3 tablets at Bedtime       fluPHENAZine 10 MG tablet   Commonly known as: PROLIXIN   Take 1 tablet daily, and1 tablet at bedtime.      For mood control       sertraline 100 MG tablet   Commonly known as: ZOLOFT   Take 1 tablet (100 mg total) by mouth daily. For depression       traZODone 100 MG tablet   Commonly known as: DESYREL   Take 1 tablet (100 mg total) by mouth at bedtime as needed for sleep. For sleep            Follow-up Information    Follow up with Erik [name] on 04/11/2011. (9:00AM appointment to have Comprehensive  Clinical Assessment to be evaluated for ACTT services)    Contact information:   [facility] [address], [address] [place] Telephone:  [phone]        Follow up with [facility] on 04/07/2011. [facility] is open Monday-Friday 8AM-3PM.  Go earlier in the day to be seen as soon as possible.)    Contact information:   [address] [place]  Telephone:  [phone]         Follow-up recommendations:  Other:  keep all scheduled follow-up appointment as recommended.  Comments:  Take all your medications as prescribed.                       Report to your outpatient provider any adverse effects from medications promptly for                       appropriate interventions.   SignedArmandina Stammer I 04/05/2011, 4:16 PM

## 2011-04-05 NOTE — [facility] Suicide Risk Assessment (Signed)
Suicide Risk Assessment  Discharge Assessment     Demographic factors:  Male;Low socioeconomic status;Living alone Living alone  Current Mental Status Per Nursing Assessment::   On Admission:  Suicidal ideation indicated by patient At Discharge:  AO x 3.    Current Mental Status Per Physician:  Diagnosis:  Axis I: Schizophrenia - Paranoid Type.   The patient was seen today and reports the following:   ADL's: Intact.  Sleep: The patient reports to continuing to sleep well at night.  Appetite: The patient reports a good appetite today.   Mild>(1-10) >Severe  Hopelessness (1-10): 0  Depression (1-10): 0  Anxiety (1-10): 0   Suicidal Ideation: The patient adamantly denies any suicidal ideations today.  Plan: No  Intent: No  Means: No   Homicidal Ideation: The patient adamantly denies any homicidal ideations today.  Plan: No  Intent: No.  Means: No   General Appearance/Behavior: Remains appropriate and cooperative today.  Eye Contact: Good.  Speech: Remains appropriate in rate and volume today with no pressuring noted.  Motor Behavior: wnl.  Level of Consciousness: Alert and Oriented x 3.  Mental Status: Alert and Oriented x 3.  Mood: Euthymic Today.  Affect: Mildly constricted.  Anxiety Level: No anxiety reported today.  Thought Process: wnl.  Thought Content: The patient again denies any auditory or visual hallucinations today as well as any delusional thinking.  Perception:. wnl  Judgment: Fair to Good.  Insight: Fair to Good.  Cognition: Oriented to person, place and time.   Loss Factors: Decrease in vocational status;Financial problems / change in socioeconomic status  Historical Factors: Chronic Mental Illness.  Risk Reduction Factors:   Sense of responsibility to family Good Access to health care.  Continued Clinical Symptoms:  Schizophrenia:   Paranoid or undifferentiated type Previous Psychiatric Diagnoses and Treatments  Discharge Diagnoses:   AXIS I:  Schizophrenia - Paranoid Type. AXIS II:  Deferred. AXIS III:  1. No Acute Illnesses Noted. AXIS IV:  Chronic Mental Illness.  History of Non-compliance with medications.  Limited Primary Support System.  No Permanent Housing. AXIS V:  GAF at time of admission approximately 30.  GAF at time of discharge approximately 50.  Cognitive Features That Contribute To Risk:  Thought constriction (tunnel vision)    Lab Results: No results found for this or any previous visit (from the past 48 hour(s)).   The patient was seen today and continues to report to sleeping well without difficulty. He also once again denies any depressive symptoms and states again today that he is not having any auditory or visual hallucinations. He also continues to deny any delusional thinking. The patient continues to plan to live in a Hotel when discharged and to follow up with [facility]. He states today that he feels ready for discharge and requests a bus pass to help him get to the [facility] today.  Treatment Plan Summary:  1. Daily contact with patient to assess and evaluate symptoms and progress in treatment  2. Medication management  3. The patient will deny suicidal ideations or homicidal ideations for 48 hours prior to discharge and have a depression and anxiety rating of 3 or less. The patient will also deny any auditory or visual hallucinations or delusional thinking.   Plan:  1. Will continue current medications.  2. Laboratory studies reviewed.  3. Will continue to monitor.  4. Discharge today to outpatient follow up with [facility].  Suicide Risk:  Minimal: No identifiable suicidal ideation.  Patients presenting with no  risk factors but with morbid ruminations; may be classified as minimal risk based on the severity of the depressive symptoms  Plan Of Care/Follow-up recommendations:  Activity:  As Tolerated. Diet:  Regular Diet. Other:  Please take all medications every day as directed and keep all  scheduled follow up appointments.  Sahar Ryback 04/05/2011, 12:16 PM

## 2011-04-06 NOTE — Progress Notes (Signed)
Patient Discharge Instructions:  Psychiatric Admission Assessment Note Provided,  04/06/2011 After Visit Summary (AVS) Provided,  04/06/2011 Face Sheet Provided, 04/06/2011 Faxed/Sent to the Next Level Care provider:  04/06/2011 Sent Suicide Risk Assessment - Discharge Assessment 04/06/2011  Faxed to [facility] @ [phone] And to [facility] @ [phone]  Wandra Scot, 04/06/2011, 3:49 PM

## 2011-05-03 ENCOUNTER — Emergency Department (HOSPITAL_COMMUNITY)
Admission: EM | Admit: 2011-05-03 | Discharge: 2011-05-03 | Disposition: A | Discharge status: Home / Self Care | Payer: Medicaid Other | Attending: Emergency Medicine | Admitting: Emergency Medicine

## 2011-05-03 ENCOUNTER — Encounter (HOSPITAL_COMMUNITY): Payer: Self-pay | Admitting: *Deleted

## 2011-05-03 DIAGNOSIS — F22 Delusional disorders: Secondary | ICD-10-CM | POA: Insufficient documentation

## 2011-05-03 DIAGNOSIS — R4182 Altered mental status, unspecified: Secondary | ICD-10-CM | POA: Insufficient documentation

## 2011-05-03 DIAGNOSIS — F341 Dysthymic disorder: Secondary | ICD-10-CM | POA: Insufficient documentation

## 2011-05-03 DIAGNOSIS — Z79899 Other long term (current) drug therapy: Secondary | ICD-10-CM | POA: Insufficient documentation

## 2011-05-03 LAB — DIFFERENTIAL
Basophils Absolute: 0 10*3/uL (ref 0.0–0.1)
Eosinophils Relative: 6 % — ABNORMAL HIGH (ref 0–5)
Lymphocytes Relative: 34 % (ref 12–46)
Lymphs Abs: 2.1 10*3/uL (ref 0.7–4.0)
Neutrophils Relative %: 48 % (ref 43–77)

## 2011-05-03 LAB — RAPID URINE DRUG SCREEN, HOSP PERFORMED
Barbiturates: NOT DETECTED
Cocaine: NOT DETECTED
Tetrahydrocannabinol: NOT DETECTED

## 2011-05-03 LAB — URINALYSIS, ROUTINE W REFLEX MICROSCOPIC
Glucose, UA: NEGATIVE mg/dL
Leukocytes, UA: NEGATIVE
Protein, ur: NEGATIVE mg/dL
Specific Gravity, Urine: 1.022 (ref 1.005–1.030)
Urobilinogen, UA: 1 mg/dL (ref 0.0–1.0)

## 2011-05-03 LAB — BASIC METABOLIC PANEL
CO2: 28 mEq/L (ref 19–32)
Calcium: 8.9 mg/dL (ref 8.4–10.5)
GFR calc non Af Amer: >90 mL/min (ref >90)
Potassium: 3.8 mEq/L (ref 3.5–5.1)
Sodium: 141 mEq/L (ref 135–145)

## 2011-05-03 LAB — CBC
MCV: 87.2 fL (ref 78.0–100.0)
Platelets: 150 10*3/uL (ref 150–400)
RBC: 4.62 MIL/uL (ref 4.22–5.81)
RDW: 14 % (ref 11.5–15.5)
WBC: 6.1 10*3/uL (ref 4.0–10.5)

## 2011-05-03 LAB — ETHANOL: Alcohol, Ethyl (B): <11 mg/dL (ref 0–11)

## 2011-05-03 NOTE — ED Notes (Signed)
Pt sts he feels like he's "having amnesia or something." C/o memory loss r/t "I bought a house last year (that he's been living in), I remember where it is but I can't remember the address. That's the biggest thing." Can't elaborate further. Denies SI/HI/AVH.

## 2011-05-03 NOTE — ED Provider Notes (Signed)
History     CSN: [id number]  Arrival date & time 05/03/11  1620   First MD Initiated Contact with Patient 05/03/11 1729      Chief Complaint  Patient presents with  . Altered Mental Status    (Consider location/radiation/quality/duration/timing/severity/associated sxs/prior treatment) HPI Pt states he recently bought a house but today can't remember where it is. He has no other knowledge lapses. He is oriented x 4. He denies hallucinations, SI, HI. No complaints of fever, pain, recent injury.  Pt has made several statements that are inconsistent, stating he has been living on the streets and has not had anything to eat other than crackers for several days Past Medical History  Diagnosis Date  . Schizoaffective disorder   . Depression   . Suicidal thoughts   . Anxiety     History reviewed. No pertinent past surgical history.  No family history on file.  History  Substance Use Topics  . Smoking status: Current Everyday Smoker -- 0.5 packs/day for 12 years    Types: Cigarettes  . Smokeless tobacco: Not on file  . Alcohol Use: No     social/rare      Review of Systems  Constitutional: Negative for fever and chills.  Respiratory: Negative for shortness of breath.   Cardiovascular: Negative for chest pain.  Neurological: Negative for syncope, weakness, numbness and headaches.  Psychiatric/Behavioral: Negative for suicidal ideas, hallucinations and self-injury. The patient is not nervous/anxious.     Allergies  Review of patient's allergies indicates no known allergies.  Home Medications   Current Outpatient Rx  Name Route Sig Dispense Refill  . BENZTROPINE MESYLATE 1 MG PO TABS Oral Take 1 tablet (1 mg total) by mouth 2 (two) times daily in the am and at bedtime.. For involuntary movements 60 tablet 0  . DIVALPROEX SODIUM ER 500 MG PO TB24  For mood control.   Take 1 tablet daily, and 3 tablets at Bedtime 120 tablet 0  . SERTRALINE HCL 100 MG PO TABS Oral Take 1  tablet (100 mg total) by mouth daily. For depression 30 tablet 0  . TRAZODONE HCL 100 MG PO TABS Oral Take 1 tablet (100 mg total) by mouth at bedtime as needed for sleep. For sleep 30 tablet 0    BP 110/75  Pulse 76  Temp(Src) 98.2 F (36.8 C) (Oral)  Resp 18  SpO2 99%  Physical Exam  Nursing note and vitals reviewed. Constitutional: He is oriented to person, place, and time. He appears well-developed and well-nourished. No distress.  HENT:  Head: Normocephalic and atraumatic.  Mouth/Throat: Oropharynx is clear and moist.  Eyes: EOM are normal. Pupils are equal, round, and reactive to light.  Neck: Normal range of motion. Neck supple.  Cardiovascular: Normal rate and regular rhythm.   Pulmonary/Chest: Effort normal and breath sounds normal. No respiratory distress. He has no wheezes. He has no rales.  Abdominal: Soft. Bowel sounds are normal. There is no tenderness. There is no rebound and no guarding.  Musculoskeletal: Normal range of motion. He exhibits no edema and no tenderness.  Neurological: He is alert and oriented to person, place, and time.       5/5 motor, sensation intact  Skin: Skin is warm and dry. No rash noted. No erythema.  Psychiatric: He has a normal mood and affect. His behavior is normal.    ED Course  Procedures (including critical care time)  Labs Reviewed  DIFFERENTIAL - Abnormal; Notable for the following:  Eosinophils Relative 6 (*)    All other components within normal limits  BASIC METABOLIC PANEL - Abnormal; Notable for the following:    Glucose, Bld 108 (*)    All other components within normal limits  CBC  URINALYSIS, ROUTINE W REFLEX MICROSCOPIC  ETHANOL  URINE RAPID DRUG SCREEN (HOSP PERFORMED)   No results found.   1. Delusions       MDM  Suspicious of malingering. Pt asking for admission and food. Will screen medically Eval'd by SW. Given shelter resources. Agree that this is either malingering or delusions. Cont to deny SI,  HI     Loren Racer, MD 05/03/11 2154

## 2011-05-03 NOTE — ED Notes (Signed)
MD at bedside. 

## 2011-05-03 NOTE — ED Notes (Signed)
CSW consult was requested by EDP. CSW met with pt to offer assistance for homeless shelter resources at time of discharge. Pt contradicts himself throughout conversation, stating to CSW that he has been to [employer] and the [facility] to assist him with housing though then states to CSW that he has his own home and is not homeless.  Pt states that he "built a house a year ago but has had difficulty remembering where it is" and he "does not want to be on the streets anymore". CSW offered to contact the pt's brother for assistance though pt states that he does not talk to his brother anymore. CSW discussed with the pt where he plans to go at time of discharge and pt is agreeable to go to [employer]. Pt also states he will follow up with the [facility] on 05/04/11 for housing assistance. Pt appears concerned regarding remembering where the house is though is calm and cooperative at this time. CSW also provided pt with information to follow up at [facility] for outpatient mental health services along with information on mobile crisis services.   Pt has a noted hx of schizoaffective disorder and appears to have some delusional thought process. Pt is denying SI/HI/AVH. CSW reported to EDP that pt had some delusional thought process related to his housing and inquired if a tele-psych needed to be completed. EDP informed CSW that a tele-psych consult is not needed at this time.    CSW signing off. Consult back to CSW as needed.

## 2011-05-03 NOTE — Discharge Instructions (Signed)
 RESOURCE GUIDE  Dental Problems  Patients with Medicaid: [facility]                     [facility] [address]                                           [address] Phone:  [phone]                                                  Phone:  [phone]  If unable to pay or uninsured, contact:  [facility] or [facility] to become qualified for the adult dental clinic.  Chronic Pain Problems Contact [facility]  [phone] Patients need to be referred by their primary care doctor.  Insufficient Money for Medicine Contact [employer]:  call "211" or [facility] 271-5999.  No Primary Care Doctor Call [facility]  832-8000 Other agencies that provide inexpensive medical care    [facility]  [phone]    [facility]  [phone]    [facility]  [phone]    [facility]  [phone]    [facility]  [phone]    [facility]  [phone]  Psychological Services [facility]  [phone] [facility]  [phone] [facility]   800 853-5163 (emergency services 641-4993)  Substance Abuse Resources [facility]  336-882-2125 [facility] 336-784-9470 [facility] 336-285-9073 [facility] 336-845-3988 Residential & Outpatient Substance Abuse Program  800-659-3381  Abuse/Neglect [place] Child Abuse Hotline (336) 641-3795 [place] Child Abuse Hotline 800-378-5315 (After Hours)  Emergency Shelter [facility] (336) 271-5985  [facility] (336) 275-9566 [facility] (704) 372-4663  MRSA Hotline #:   832-7006    [place] Resources  [facility] [place]     [employer]                          [facility] [address] [place]                       [address]      [address]  [place]                                                [place]                            [place] Phone:  [phone]                                   Phone:  [phone]                 Phone:  [phone]  [facility] Phone:    [phone]  [place] Child Abuse Hotline (336) 342-1394 (336) 342-3537 (After Hours)   

## 2011-05-17 ENCOUNTER — Emergency Department (EMERGENCY_DEPARTMENT_HOSPITAL)
Admission: EM | Admit: 2011-05-17 | Discharge: 2011-05-20 | Disposition: A | Discharge status: Home / Self Care | Payer: Medicaid Other | Source: Home / Self Care | Attending: Emergency Medicine | Admitting: Emergency Medicine

## 2011-05-17 ENCOUNTER — Ambulatory Visit (HOSPITAL_COMMUNITY)
Admission: EM | Admit: 2011-05-17 | Discharge: 2011-05-17 | Disposition: A | Discharge status: Home / Self Care | Payer: Medicaid Other | Attending: Psychiatry | Admitting: Psychiatry

## 2011-05-17 ENCOUNTER — Emergency Department (HOSPITAL_COMMUNITY)
Admission: EM | Admit: 2011-05-17 | Discharge: 2011-05-17 | Discharge status: Absent without leave | Payer: Medicaid Other | Source: Home / Self Care

## 2011-05-17 ENCOUNTER — Encounter (HOSPITAL_COMMUNITY): Payer: Self-pay | Admitting: Emergency Medicine

## 2011-05-17 DIAGNOSIS — F329 Major depressive disorder, single episode, unspecified: Secondary | ICD-10-CM | POA: Insufficient documentation

## 2011-05-17 DIAGNOSIS — F17200 Nicotine dependence, unspecified, uncomplicated: Secondary | ICD-10-CM | POA: Insufficient documentation

## 2011-05-17 DIAGNOSIS — F259 Schizoaffective disorder, unspecified: Secondary | ICD-10-CM | POA: Insufficient documentation

## 2011-05-17 DIAGNOSIS — F22 Delusional disorders: Secondary | ICD-10-CM | POA: Insufficient documentation

## 2011-05-17 DIAGNOSIS — R45851 Suicidal ideations: Secondary | ICD-10-CM | POA: Insufficient documentation

## 2011-05-17 DIAGNOSIS — F411 Generalized anxiety disorder: Secondary | ICD-10-CM | POA: Insufficient documentation

## 2011-05-17 DIAGNOSIS — F341 Dysthymic disorder: Secondary | ICD-10-CM

## 2011-05-17 DIAGNOSIS — F3289 Other specified depressive episodes: Secondary | ICD-10-CM | POA: Insufficient documentation

## 2011-05-17 DIAGNOSIS — Z91199 Patient's noncompliance with other medical treatment and regimen due to unspecified reason: Secondary | ICD-10-CM | POA: Insufficient documentation

## 2011-05-17 DIAGNOSIS — Z9119 Patient's noncompliance with other medical treatment and regimen: Secondary | ICD-10-CM | POA: Insufficient documentation

## 2011-05-17 DIAGNOSIS — Z79899 Other long term (current) drug therapy: Secondary | ICD-10-CM | POA: Insufficient documentation

## 2011-05-17 DIAGNOSIS — Z0389 Encounter for observation for other suspected diseases and conditions ruled out: Secondary | ICD-10-CM | POA: Insufficient documentation

## 2011-05-17 DIAGNOSIS — F200 Paranoid schizophrenia: Secondary | ICD-10-CM

## 2011-05-17 LAB — RAPID URINE DRUG SCREEN, HOSP PERFORMED
Amphetamines: NOT DETECTED
Barbiturates: NOT DETECTED
Benzodiazepines: NOT DETECTED
Tetrahydrocannabinol: POSITIVE — AB

## 2011-05-17 LAB — CBC
Hemoglobin: 15 g/dL (ref 13.0–17.0)
MCHC: 34.2 g/dL (ref 30.0–36.0)
Platelets: 211 10*3/uL (ref 150–400)
RBC: 5.01 MIL/uL (ref 4.22–5.81)

## 2011-05-17 LAB — COMPREHENSIVE METABOLIC PANEL
ALT: 15 U/L (ref 0–53)
AST: 21 U/L (ref 0–37)
Albumin: 3.8 g/dL (ref 3.5–5.2)
Alkaline Phosphatase: 52 U/L (ref 39–117)
GFR calc Af Amer: >90 mL/min (ref >90)
Glucose, Bld: 142 mg/dL — ABNORMAL HIGH (ref 70–99)
Potassium: 3.9 mEq/L (ref 3.5–5.1)
Sodium: 138 mEq/L (ref 135–145)
Total Protein: 7.4 g/dL (ref 6.0–8.3)

## 2011-05-17 MED ORDER — ONDANSETRON HCL 4 MG PO TABS: 4.0000 mg | ORAL_TABLET | Freq: Three times a day (TID) | ORAL | Status: DC | PRN

## 2011-05-17 MED ORDER — NICOTINE 21 MG/24HR TD PT24
21.0000 mg | MEDICATED_PATCH | Freq: Every day | TRANSDERMAL | Status: DC
  Filled 2011-05-17: qty 1

## 2011-05-17 MED ORDER — LORAZEPAM 1 MG PO TABS: 1.0000 mg | ORAL_TABLET | Freq: Three times a day (TID) | ORAL | Status: DC | PRN

## 2011-05-17 MED ORDER — ZOLPIDEM TARTRATE 5 MG PO TABS: 5.0000 mg | ORAL_TABLET | Freq: Every evening | ORAL | Status: DC | PRN

## 2011-05-17 MED ORDER — TRAZODONE HCL 100 MG PO TABS
100.0000 mg | ORAL_TABLET | Freq: Every evening | ORAL | Status: DC | PRN
  Administered 2011-05-19: 100 mg via ORAL
  Filled 2011-05-17: qty 1

## 2011-05-17 MED ORDER — DIVALPROEX SODIUM ER 500 MG PO TB24
500.0000 mg | ORAL_TABLET | Freq: Every day | ORAL | Status: DC
  Administered 2011-05-17: 500 mg via ORAL
  Filled 2011-05-17: qty 1

## 2011-05-17 MED ORDER — SERTRALINE HCL 50 MG PO TABS
100.0000 mg | ORAL_TABLET | Freq: Every day | ORAL | Status: DC
  Administered 2011-05-17 – 2011-05-20 (×4): 100 mg via ORAL
  Filled 2011-05-17: qty 2
  Filled 2011-05-17: qty 1
  Filled 2011-05-17 (×2): qty 2
  Filled 2011-05-17: qty 1

## 2011-05-17 MED ORDER — BENZTROPINE MESYLATE 1 MG PO TABS
1.0000 mg | ORAL_TABLET | ORAL | Status: DC
  Administered 2011-05-17 – 2011-05-20 (×7): 1 mg via ORAL
  Filled 2011-05-17 (×7): qty 1

## 2011-05-17 MED ORDER — ACETAMINOPHEN 325 MG PO TABS: 650.0000 mg | ORAL_TABLET | ORAL | Status: DC | PRN

## 2011-05-17 MED ORDER — IBUPROFEN 600 MG PO TABS: 600.0000 mg | ORAL_TABLET | Freq: Three times a day (TID) | ORAL | Status: DC | PRN

## 2011-05-17 MED ORDER — DIVALPROEX SODIUM ER 500 MG PO TB24
1000.0000 mg | ORAL_TABLET | Freq: Every day | ORAL | Status: DC
  Administered 2011-05-18 – 2011-05-20 (×3): 1000 mg via ORAL
  Filled 2011-05-17 (×3): qty 2

## 2011-05-17 MED ORDER — ALUM & MAG HYDROXIDE-SIMETH 200-200-20 MG/5ML PO SUSP: 30.0000 mL | ORAL | Status: DC | PRN

## 2011-05-17 NOTE — BH Assessment (Addendum)
Patient completed telepsych consult and discharge was recommended. Dr. Freida Busman will put patient up for discharge after evaluating him. Dr. Freida Busman stating that after evaluating patient he continues to endorse suicidal thoughts. Patient will not be placed up for discharge at this time. Dr. Freida Busman suggesting that patient is evaluated by Dr. Henrene Hawking.   Writer discussed issue with the psychiatrist Dr. Elsie Saas and asked him to evaluated patient for appropriate level of care.   Writer informed both EDP and psychiatrist that patient has a history of mult. ED visits. He typically reports similar symptoms and when it's time for discharge patient becomes angry and belligerent. Also, during previous discharges from the ED patient verbalizes his need to stay due to his homelessness. Patient has a significant hx of malingering. Today patient displays his on-going pattern of becoming angry when told he is up for discharge stating, "I can't go b/c some lady told me that I can stay here until [facility] takes me".

## 2011-05-17 NOTE — ED Notes (Signed)
Dr. Freida Busman in to see pt.; assess if appropriate for discharge or alternative disposition.

## 2011-05-17 NOTE — ED Provider Notes (Signed)
  Pt seen by tele-psych and initially cleared for d/c, I spoke with pt and he continues to endorse SI and voices. He will need placement  Toy Baker, MD 05/17/11 1501

## 2011-05-17 NOTE — ED Notes (Signed)
Pt alert, arrives from [facility], presents with Hearing voices, thoughts of harming self, resp even unlabored, skin pwd, pt taking to self in triage, states" i jump out of a plane onto the [employer]

## 2011-05-17 NOTE — BH Assessment (Signed)
Assessment Note   Erik [name] is an 36 y.o. male who presented to this facility as a walk in and complaining of depression and hearing voices commending him to kill himself.  Pt was here last month for the same problem. Has had multiple admissions at [facility]. Pt reports that the voices are telling him "you will never get out of it". Patient reports that he takes his Depakote and Zoloft as prescribed "but it doesn't work". Patient also reports that he has no family support, family members do not want him around "they hate me a lot". Pt has a history of Schizophrenia and has been hearing voices for a long time. Admits that he drinks beer occasionally but denies use of drugs. Pt also reports that he has financial problems: received his disability money at the beginning of the month but has used all of it. Pt feels depressed and seeking help to get rid of the voices. Aggie recommended to send him to [facility] for med clearance.   Axis I: Schizoaffective Disorder Axis II: Deferred Axis III:  Past Medical History  Diagnosis Date  . Schizoaffective disorder   . Depression   . Suicidal thoughts   . Anxiety    Axis IV: economic problems, occupational problems, other psychosocial or environmental problems, problems related to social environment and problems with primary support group Axis V: 11-20 some danger of hurting self or others possible OR occasionally fails to maintain minimal personal hygiene OR gross impairment in communication  Past Medical History:  Past Medical History  Diagnosis Date  . Schizoaffective disorder   . Depression   . Suicidal thoughts   . Anxiety     No past surgical history on file.  Family History: Brother has polysubstance abuse problem  Social History:  reports that he has been smoking Cigarettes.  He has a 6 pack-year smoking history. He does not have any smokeless tobacco history on file. He reports that he does not drink alcohol or use illicit drugs.  Additional  Social History:    Drinks beer occasionally. Last intake: last week Allergies: No Known Allergies  Home Medications:  (Not in a hospital admission)  OB/GYN Status:  No LMP for male patient.  General Assessment Data Location of Assessment: [facility] Assessment Services Living Arrangements: Alone Can pt return to current living arrangement?: Yes Admission Status: Voluntary Is patient capable of signing voluntary admission?: Yes Transfer from: Home Referral Source: Self/Family/Friend  Education Status Is patient currently in school?: No Current Grade: na Highest grade of school patient has completed: na Name of school: na Contact person: na  Risk to self Suicidal Ideation: Yes-Currently Present Suicidal Intent: Yes-Currently Present Is patient at risk for suicide?: Yes Suicidal Plan?: Yes-Currently Present Specify Current Suicidal Plan: to take all his medecine Access to Means: Yes Specify Access to Suicidal Means: his prescribed medications What has been your use of drugs/alcohol within the last 12 months?: denies Previous Attempts/Gestures: No How many times?: 0  Other Self Harm Risks: na Triggers for Past Attempts: Unknown Intentional Self Injurious Behavior: None Family Suicide History: Unknown Recent stressful life event(s): Financial Problems Persecutory voices/beliefs?: Yes Depression: Yes Depression Symptoms: Insomnia;Fatigue;Feeling worthless/self pity Substance abuse history and/or treatment for substance abuse?: Yes Suicide prevention information given to non-admitted patients: Not applicable  Risk to Others Homicidal Ideation: No Thoughts of Harm to Others: No Current Homicidal Intent: No Current Homicidal Plan: No Access to Homicidal Means: No Identified Victim: na History of harm to others?: No Assessment of Violence:  None Noted Violent Behavior Description: na Does patient have access to weapons?: No Criminal Charges Pending?: No Does patient have a  court date: No  Psychosis Hallucinations: Auditory Delusions: None noted  Mental Status Report Appear/Hygiene: Disheveled Eye Contact: Fair Motor Activity: Other (Comment) (restless) Speech: Word salad;Other (Comment) (normal) Level of Consciousness: Alert Mood: Depressed;Anxious Affect: Anxious;Depressed Anxiety Level: Moderate Thought Processes: Irrelevant Judgement: Impaired Orientation: Person;Place;Time;Situation Obsessive Compulsive Thoughts/Behaviors: Moderate  Cognitive Functioning Concentration: Decreased Memory: Recent Intact IQ: Average Insight: Fair Impulse Control: Fair Appetite: Good Weight Loss: 5  Weight Gain: 0  Sleep: Decreased Total Hours of Sleep: 5  Vegetative Symptoms: Decreased grooming  Prior Inpatient Therapy Prior Inpatient Therapy: Yes Prior Therapy Dates: April 2013 Prior Therapy Facilty[facility]Provider(s): [facility] Reason for Treatment: voices/depression  Prior Outpatient Therapy Prior Outpatient Therapy: No Prior Therapy Dates: na Prior Therapy Facilty/Provider(s): na Reason for Treatment: na                     Additional Information 1:1 In Past 12 Months?: No CIRT Risk: No Elopement Risk: No     Disposition:  Disposition Disposition of Patient: Other dispositions Other disposition(s): Other (Comment) Patient referred to: Other (Comment) ([facility] for medical clearance)  On Site Evaluation by:   Reviewed with Physician:     Olin Pia 05/17/2011 6:55 AM

## 2011-05-17 NOTE — ED Notes (Signed)
Pt not speaking/answering any assessment questions, in room sleeping

## 2011-05-17 NOTE — BH Assessment (Signed)
Writer was informed by Dr. Freida Busman that patient would be discharged home per telepsychiatrist consult. Writer will provide patient with out-pt referrals to [facility] or [facility], [facility], [facility], [facility], etc.

## 2011-05-17 NOTE — Consult Note (Signed)
Reason for Consult: Psychosis, mania and suicidal ideation Referring Physician: Dr. Renae Gloss is an 36 y.o. male.  HPI: This is a 36 years old African American young male who was suffering with the schizoaffective disorder, chronic and marijuana abuse and noncompliance with medications. Patient is staying alone and felt the suicidal ideation with a plan of overdosing on his pills and than has command hallucinations, felt unsafe, so called the ambulance for getting help. Patient has been [facility] to [facility] several times in the past and the recently about a month ago. Patient stated he does not like to go to [facility] outpatient psychiatric services because they don't treat him right and the he has decided to come to the hospital every time he need help. Patient reported voice her derogatory telling him "as he never get better never accomplished anything not good". Patient was initially evaluated medically psychiatrically and recommended outpatient psychiatric services. Patient repeatedly endorsing suicidal ideation and and plan of overdosing on medications and the psych unit he was requested to the evaluate him.   Mental status: Patient was appeared staying in his bed lying down and covered with bedsheets. Patient was disheveled poor hygiene and poor grooming. Patient stated mood was angry and irritable she affect was appropriate. Patient has a normal speech but frequently mumbling. Patient gets annoyed when asked him to repeat. Patient has endorsed suicidal ideation , intention and plan patient has no homicidal ideation, intention or plan. Patient has command hallucinations but no visual hallucinations or delusions patient has poor insight judgment and impulse control.   Past Medical History  Diagnosis Date  . Schizoaffective disorder   . Depression   . Suicidal thoughts   . Anxiety     History reviewed. No pertinent past surgical history.  No family history on  file.  Social History:  reports that he has been smoking Cigarettes.  He has a 6 pack-year smoking history. He does not have any smokeless tobacco history on file. He reports that he does not drink alcohol or use illicit drugs.  Allergies: No Known Allergies  Medications: I have reviewed the patient's current medications.  Results for orders placed during the hospital encounter of 05/17/11 (from the past 48 hour(s))  CBC     Status: Normal   Collection Time   05/17/11  3:55 AM      Component Value Range Comment   WBC 5.2  4.0 - 10.5 (K/uL)    RBC 5.01  4.22 - 5.81 (MIL/uL)    Hemoglobin 15.0  13.0 - 17.0 (g/dL)    HCT 78.2  95.6 - 21.3 (%)    MCV 87.6  78.0 - 100.0 (fL)    MCH 29.9  26.0 - 34.0 (pg)    MCHC 34.2  30.0 - 36.0 (g/dL)    RDW 08.6  57.8 - 46.9 (%)    Platelets 211  150 - 400 (K/uL)   COMPREHENSIVE METABOLIC PANEL     Status: Abnormal   Collection Time   05/17/11  3:55 AM      Component Value Range Comment   Sodium 138  135 - 145 (mEq/L)    Potassium 3.9  3.5 - 5.1 (mEq/L)    Chloride 103  96 - 112 (mEq/L)    CO2 26  19 - 32 (mEq/L)    Glucose, Bld 142 (*) 70 - 99 (mg/dL)    BUN 13  6 - 23 (mg/dL)    Creatinine, Ser 6.29  0.50 - 1.35 (  mg/dL)    Calcium 9.4  8.4 - 10.5 (mg/dL)    Total Protein 7.4  6.0 - 8.3 (g/dL)    Albumin 3.8  3.5 - 5.2 (g/dL)    AST 21  0 - 37 (U/L) NO VISIBLE HEMOLYSIS   ALT 15  0 - 53 (U/L)    Alkaline Phosphatase 52  39 - 117 (U/L)    Total Bilirubin 0.3  0.3 - 1.2 (mg/dL)    GFR calc non Af Amer >90  >90 (mL/min)    GFR calc Af Amer >90  >90 (mL/min)   ETHANOL     Status: Normal   Collection Time   05/17/11  3:55 AM      Component Value Range Comment   Alcohol, Ethyl (B) <11  0 - 11 (mg/dL)   URINE RAPID DRUG SCREEN (HOSP PERFORMED)     Status: Abnormal   Collection Time   05/17/11  5:10 AM      Component Value Range Comment   Opiates POSITIVE (*) NONE DETECTED     Cocaine NONE DETECTED  NONE DETECTED     Benzodiazepines NONE  DETECTED  NONE DETECTED     Amphetamines NONE DETECTED  NONE DETECTED     Tetrahydrocannabinol POSITIVE (*) NONE DETECTED     Barbiturates NONE DETECTED  NONE DETECTED      No results found.  Positive for anxiety, bad mood, behavior problems, bipolar, depression, illegal drug usage, mood swings and  homelessness Blood pressure 120/78, pulse 96, temperature 98 F (36.7 C), temperature source Oral, resp. rate 24, SpO2 98.00%.   Assessment/Plan: Schizoaffective disorder, most recent episode bipolar mania and suicidal thoughts Cannabis abuse, opiate abuse and cocaine dependence  Recommended acute psychiatric hospitalization for stabilization of bipolar psychosis and suicidal ideation. Will increase his Depakote 1000 mg at bedtime for controlling bipolar manic symptoms and patient made need antipsychotic medication if endorses psychosis.  Baila Rouse,JANARDHAHA R. 05/17/2011, 5:36 PM

## 2011-05-17 NOTE — BH Assessment (Signed)
Patient referred to [facility] earlier this morning. Received from staff at [facility] stating that he was declined due to patient acuity.

## 2011-05-17 NOTE — ED Provider Notes (Signed)
History     CSN: [id number]  Arrival date & time 05/17/11  4696   First MD Initiated Contact with Patient 05/17/11 0501      Chief Complaint  Patient presents with  . Medical Clearance    (Consider location/radiation/quality/duration/timing/severity/associated sxs/prior treatment) HPI Comments: Pt is a known paranoid schizophrenic who has frequent ED visits for paranoia, hallucinations who states that he has had 3 days of worsening hallucinations and thoughts of suicide - states has had some thoughts of OD though denies attempting.  He refuses to say more about what the voices are telling him.  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 He denies physical complaints including chest pain, abdominal pain, headache, fever, vomiting, diarrhea, rashes, swelling.  The history is provided by the patient.    Past Medical History  Diagnosis Date  . Schizoaffective disorder   . Depression   . Suicidal thoughts   . Anxiety     History reviewed. No pertinent past surgical history.  No family history on file.  History  Substance Use Topics  . Smoking status: Current Everyday Smoker -- 0.5 packs/day for 12 years    Types: Cigarettes  . Smokeless tobacco: Not on file  . Alcohol Use: No     social/rare      Review of Systems  All other systems reviewed and are negative.    Allergies  Review of patient's allergies indicates no known allergies.  Home Medications   Current Outpatient Rx  Name Route Sig Dispense Refill  . BENZTROPINE MESYLATE 1 MG PO TABS Oral Take 1 tablet (1 mg total) by mouth 2 (two) times daily in the am and at bedtime.. For involuntary movements 60 tablet 0  . DIVALPROEX SODIUM ER 500 MG PO TB24  For mood  control.   Take 1 tablet daily, and 3 tablets at Bedtime 120 tablet 0  . SERTRALINE HCL 100 MG PO TABS Oral Take 1 tablet (100 mg total) by mouth daily. For depression 30 tablet 0  . TRAZODONE HCL 100 MG PO TABS Oral Take 1 tablet (100 mg total) by mouth at bedtime as needed for sleep. For sleep 30 tablet 0    BP 108/75  Pulse 89  Temp 98 F (36.7 C)  Resp 16  SpO2 99%  Physical Exam  Nursing note and vitals reviewed. Constitutional: He appears well-developed and well-nourished. No distress.  HENT:  Head: Normocephalic and atraumatic.  Mouth/Throat: Oropharynx is clear and moist. No oropharyngeal exudate.  Eyes: Conjunctivae and EOM are normal. Pupils are equal, round, and reactive to light. Right eye exhibits no discharge. Left eye exhibits no discharge. No scleral icterus.  Neck: Normal range of motion. Neck supple. No JVD present. No thyromegaly present.  Cardiovascular: Normal rate, regular rhythm, normal heart sounds and intact distal pulses.  Exam reveals no gallop and no friction rub.   No murmur heard. Pulmonary/Chest: Effort normal and breath sounds normal. No respiratory distress. He has no wheezes. He has no rales.  Abdominal: Soft. Bowel sounds are normal. He exhibits no distension and no mass. There is no tenderness.  Musculoskeletal: Normal range of motion. He exhibits no edema and no tenderness.  Lymphadenopathy:    He has  no cervical adenopathy.  Neurological: He is alert. Coordination normal.  Skin: Skin is warm and dry. No rash noted. No erythema.  Psychiatric:       Patient appears mildly agitated, answers "most questions with a clear voice, appears to be pacing somewhat, appears paranoid, bizarre affect    ED Course  Procedures (including critical care time)  Labs Reviewed  COMPREHENSIVE METABOLIC PANEL - Abnormal; Notable for the following:    Glucose, Bld 142 (*)    All other components within normal limits  CBC  ETHANOL  URINE RAPID DRUG SCREEN  (HOSP PERFORMED)   No results found.   No diagnosis found.    MDM  Patient appears to be responding to internal stimuli, vital signs are normal, exam is unremarkable other than psychiatric exam, will obtain tele - psychiatric consult as this patient does have chronic symptoms of depression, hallucinations and suicidal thoughts. Discussed with Barth Kirks from the ACT team who agrees with plan        Vida Roller, MD 05/17/11 [phone]

## 2011-05-17 NOTE — ED Notes (Signed)
Pt put in blue scrubs and red socks. Pt seems edgy/ slightly jittery, but is currently complying.

## 2011-05-17 NOTE — ED Notes (Signed)
Pt states that he has body pain/leg pain from where he jumped out of the [facility] in 2001. He also feels as if someone has been harvesting organs from him. Hx of bipolar disorder. Does want help with his psychological issues. Has been taking more medication than prescribed.

## 2011-05-18 NOTE — Progress Notes (Signed)
Invited pt to join behavioral health group being facilitated in Psych ED, pt declined.  Cochise Dinneen B MS, LPCA, NCC 

## 2011-05-18 NOTE — Progress Notes (Signed)
8:18 AM Pt still suicidal.  Awaiting placement.

## 2011-05-18 NOTE — Consult Note (Signed)
Reason for Consult: bipolar mania Referring Physician: Dr. Clementeen Hoof is an 36 y.o. male.  HPI: Patient was in his room and not in distress. He appeared staying in his bed and sleeping with covering with bed sheet from head to toe. He continues to be suicidal but refused behavioral health group today. He is compliant with medication given to him. He has not irritable, agitated or aggressive.   Past Medical History  Diagnosis Date  . Schizoaffective disorder   . Depression   . Suicidal thoughts   . Anxiety     History reviewed. No pertinent past surgical history.  No family history on file.  Social History:  reports that he has been smoking Cigarettes.  He has a 6 pack-year smoking history. He does not have any smokeless tobacco history on file. He reports that he does not drink alcohol or use illicit drugs.  Allergies: No Known Allergies  Medications: I have reviewed the patient's current medications.  Results for orders placed during the hospital encounter of 05/17/11 (from the past 48 hour(s))  CBC     Status: Normal   Collection Time   05/17/11  3:55 AM      Component Value Range Comment   WBC 5.2  4.0 - 10.5 (K/uL)    RBC 5.01  4.22 - 5.81 (MIL/uL)    Hemoglobin 15.0  13.0 - 17.0 (g/dL)    HCT 16.1  09.6 - 04.5 (%)    MCV 87.6  78.0 - 100.0 (fL)    MCH 29.9  26.0 - 34.0 (pg)    MCHC 34.2  30.0 - 36.0 (g/dL)    RDW 40.9  81.1 - 91.4 (%)    Platelets 211  150 - 400 (K/uL)   COMPREHENSIVE METABOLIC PANEL     Status: Abnormal   Collection Time   05/17/11  3:55 AM      Component Value Range Comment   Sodium 138  135 - 145 (mEq/L)    Potassium 3.9  3.5 - 5.1 (mEq/L)    Chloride 103  96 - 112 (mEq/L)    CO2 26  19 - 32 (mEq/L)    Glucose, Bld 142 (*) 70 - 99 (mg/dL)    BUN 13  6 - 23 (mg/dL)    Creatinine, Ser 7.82  0.50 - 1.35 (mg/dL)    Calcium 9.4  8.4 - 10.5 (mg/dL)    Total Protein 7.4  6.0 - 8.3 (g/dL)    Albumin 3.8  3.5 - 5.2 (g/dL)    AST 21  0 - 37  (U/L) NO VISIBLE HEMOLYSIS   ALT 15  0 - 53 (U/L)    Alkaline Phosphatase 52  39 - 117 (U/L)    Total Bilirubin 0.3  0.3 - 1.2 (mg/dL)    GFR calc non Af Amer >90  >90 (mL/min)    GFR calc Af Amer >90  >90 (mL/min)   ETHANOL     Status: Normal   Collection Time   05/17/11  3:55 AM      Component Value Range Comment   Alcohol, Ethyl (B) <11  0 - 11 (mg/dL)   URINE RAPID DRUG SCREEN (HOSP PERFORMED)     Status: Abnormal   Collection Time   05/17/11  5:10 AM      Component Value Range Comment   Opiates POSITIVE (*) NONE DETECTED     Cocaine NONE DETECTED  NONE DETECTED     Benzodiazepines NONE DETECTED  NONE DETECTED  Amphetamines NONE DETECTED  NONE DETECTED     Tetrahydrocannabinol POSITIVE (*) NONE DETECTED     Barbiturates NONE DETECTED  NONE DETECTED      No results found.  Positive for bad mood, bipolar, illegal drug usage and mood swings Blood pressure 117/74, pulse 90, temperature 98.3 F (36.8 C), temperature source Oral, resp. rate 18, SpO2 100.00%.   Assessment/Plan: Bipolar disorder, MRE is mania  Non compliance with medications Cannabis abuse vs dependence  Pending acute in patient psychiatric hospitalization and continue current medications  Iylah Dworkin,JANARDHAHA R. 05/18/2011, 4:37 PM

## 2011-05-18 NOTE — BH Assessment (Signed)
Assessment Note   Erik [name] is an 36 y.o. male who presented to [facility] voluntarily, endorsing SI with a plan to overdose. Pt also presented endorsing auditory hallucinations, stating he has been hearing voices that have been telling him to kill himself. Per notes, he told RN that he has a history of schizophrenia and finical difficulties. On reassessment, pt endorses depressive symptoms and SI. Pt appeared irritable and pulled a blanket over his head, refusing to look at this Clinical research associate. Pt also would only answer questions with "uh huh" and "no." Pt denies HI and SA. When asked about [facility], pt did would not respond. Pt was assessed by [facility] psychiatrist earlier today, who recommends pt needs inpatient behavioral health treatment for further evaluation and stabilization.      Axis I: Schizoaffective Disorder Axis II: Deferred Axis III:  Past Medical History  Diagnosis Date  . Schizoaffective disorder   . Depression   . Suicidal thoughts   . Anxiety    Axis IV: economic problems, housing problems and other psychosocial or environmental problems Axis V: 31-40 impairment in reality testing  Past Medical History:  Past Medical History  Diagnosis Date  . Schizoaffective disorder   . Depression   . Suicidal thoughts   . Anxiety     History reviewed. No pertinent past surgical history.  Family History: No family history on file.  Social History:  reports that he has been smoking Cigarettes.  He has a 6 pack-year smoking history. He does not have any smokeless tobacco history on file. He reports that he does not drink alcohol or use illicit drugs.  Additional Social History:    Allergies: No Known Allergies  Home Medications:  (Not in a hospital admission)  OB/GYN Status:  No LMP for male patient.  General Assessment Data Location of Assessment: [facility] ED Living Arrangements: Alone (homeless) Can pt return to current living arrangement?: Yes Admission Status: Voluntary Is patient capable of  signing voluntary admission?: Yes Transfer from: Acute Hospital Referral Source: Self/Family/Friend  Education Status Is patient currently in school?: No  Risk to self Suicidal Ideation: Yes-Currently Present Suicidal Intent: No Is patient at risk for suicide?: No Suicidal Plan?: No What has been your use of drugs/alcohol within the last 12 months?: denies Previous Attempts/Gestures: No How many times?: 0  Other Self Harm Risks: none Triggers for Past Attempts: None known Intentional Self Injurious Behavior: None Family Suicide History: Unknown Recent stressful life event(s): Financial Problems Persecutory voices/beliefs?: No Depression: Yes Depression Symptoms: Feeling worthless/self pity Substance abuse history and/or treatment for substance abuse?: Yes Suicide prevention information given to non-admitted patients: Not applicable  Risk to Others Homicidal Ideation: No Thoughts of Harm to Others: No Current Homicidal Intent: No Current Homicidal Plan: No Access to Homicidal Means: No Identified Victim: n/a History of harm to others?: No Assessment of Violence: None Noted Violent Behavior Description: none Does patient have access to weapons?: No Criminal Charges Pending?: No Does patient have a court date: No  Psychosis Hallucinations: Auditory Delusions: None noted  Mental Status Report Appear/Hygiene: Disheveled Eye Contact: Other (Comment) (none) Motor Activity: Unremarkable Speech: Elective mutism Level of Consciousness: Alert Mood: Suspicious;Irritable Affect: Apathetic Anxiety Level: None Thought Processes: Coherent;Relevant Judgement: Impaired Orientation: Person;Place;Time;Situation Obsessive Compulsive Thoughts/Behaviors: None  Cognitive Functioning Concentration: Normal Memory: Recent Intact;Remote Intact IQ: Average Insight: Poor Impulse Control: Poor Appetite: Good Weight Loss: 5  Weight Gain: 0  Sleep: Decreased Vegetative Symptoms:  None  Prior Inpatient Therapy Prior Inpatient Therapy: Yes Prior Therapy  Dates: April 2013 Prior Therapy Facilty/Provider(s): [facility] Reason for Treatment: voices/depression  Prior Outpatient Therapy Prior Outpatient Therapy: No Prior Therapy Dates: na Prior Therapy Facilty/Provider(s): na Reason for Treatment: na  ADL Screening (condition at time of admission) Patient's cognitive ability adequate to safely complete daily activities?: Yes Patient able to express need for assistance with ADLs?: Yes Independently performs ADLs?: Yes Weakness of Legs: None Weakness of Arms/Hands: None  Home Assistive Devices/Equipment Home Assistive Devices/Equipment: None    Abuse/Neglect Assessment (Assessment to be complete while patient is alone) Physical Abuse: Denies Verbal Abuse: Denies Sexual Abuse: Denies Exploitation of patient/patient's resources: Denies Self-Neglect: Denies Values / Beliefs Cultural Requests During Hospitalization: None Spiritual Requests During Hospitalization: None   Advance Directives (For Healthcare) Advance Directive: Patient does not have advance directive Pre-existing out of facility DNR order (yellow form or pink MOST form): No Nutrition Screen Diet: Regular Unintentional weight loss greater than 10lbs within the last month: No Problems chewing or swallowing foods and/or liquids: No Home Tube Feeding or Total Parenteral Nutrition (TPN): No Patient appears severely malnourished: No  Additional Information 1:1 In Past 12 Months?: No CIRT Risk: No Elopement Risk: No Does patient have medical clearance?: Yes     Disposition:  Disposition Disposition of Patient: Referred to;Inpatient treatment program Type of inpatient treatment program: Adult Other disposition(s): Referred to outside facility  On Site Evaluation by:   Reviewed with Physician:     Georgina Quint A 05/18/2011 10:05 PM

## 2011-05-19 NOTE — Discharge Planning (Signed)
Morrie Sheldon from [facility] who is responsible for tracking readmissions to [facility] contacted CSW to discuss patient. Morrie Sheldon advised she thinks it will be helpful if while patient is here in the [facility] ED, a provider from the community come in to see him since pt does not follow-up with them when he is discharged.  CSW agreed that this would be beneficial. Morrie Sheldon also reported the state is working on a housing program that patient may be able to reside in since housing continues to be a challenge for him. Morrie Sheldon reported she will speak with the local housing coordinator for the program to see if they will be able to visit with patient while here.  CSW provided her with cell number and advised that if the provider can come and visit with patient, we will have to be notified prior so that patient can sign consent. This was agreed upon.  Manson Passey Alanny Rivers ANN S , MSW, LCSWA 05/19/2011  1:09 PM [phone]

## 2011-05-19 NOTE — BH Assessment (Addendum)
Assessment Note  Per Elenore Paddy [facility] authorization number is [id number]. Pt is on [facility] wait list, per Junious Dresser.  Georgina Quint A 05/19/2011 4:19 AM

## 2011-05-19 NOTE — Discharge Planning (Addendum)
CSW confirmed with Elnita Maxwell at [facility] that patient is on waiting list. No additional information needed at this time.  Manson Passey Shondale Quinley ANN S , MSW, LCSWA 05/19/2011 10:57 AM [phone]

## 2011-05-19 NOTE — Consult Note (Signed)
Reason for Consult: Schizoaffective, most recent episode depression, suicidal ideation and noncompliance  Referring Physician: Dr. Clementeen Hoof is an 36 y.o. male.  HPI: Patient continued to be depressed and suicidal. Patient reported he is going to overdose on medication if he get discharged from a hospital. Patient has no motivation to get better and seems like dating secondary gains for being sick. Patient has an attitude, does not even make eye contact or talk to the provider until use the word he will be discharged soon. He appeared lying in his bed covering with bedsheets all over body from head to toe he has similar way even yesterday. Patient does not discuss or talk about medication management, medication administration, side effects or feature treatment plans.  Past Medical History  Diagnosis Date  . Schizoaffective disorder   . Depression   . Suicidal thoughts   . Anxiety     History reviewed. No pertinent past surgical history.  No family history on file.  Social History:  reports that he has been smoking Cigarettes.  He has a 6 pack-year smoking history. He does not have any smokeless tobacco history on file. He reports that he does not drink alcohol or use illicit drugs.  Allergies: No Known Allergies  Medications: I have reviewed the patient's current medications.  No results found for this or any previous visit (from the past 48 hour(s)).  No results found.  No anxiety, No psychosis and Positive for bad mood and depression Blood pressure 82/51, pulse 98, temperature 98.1 F (36.7 C), temperature source Oral, resp. rate 18, SpO2 98.00%.   Assessment/Plan: Schizoaffective disorder most recent episode bipolar depression and suicidal ideation  [facility] and continue his current medication  Deontay Ladnier,JANARDHAHA R. 05/19/2011, 4:26 PM

## 2011-05-20 ENCOUNTER — Emergency Department (HOSPITAL_COMMUNITY)
Admission: EM | Admit: 2011-05-20 | Discharge: 2011-05-23 | Disposition: A | Discharge status: Home / Self Care | Payer: Medicaid Other | Attending: Emergency Medicine | Admitting: Emergency Medicine

## 2011-05-20 ENCOUNTER — Encounter (HOSPITAL_COMMUNITY): Payer: Self-pay | Admitting: Emergency Medicine

## 2011-05-20 DIAGNOSIS — F22 Delusional disorders: Secondary | ICD-10-CM

## 2011-05-20 DIAGNOSIS — R45851 Suicidal ideations: Secondary | ICD-10-CM

## 2011-05-20 NOTE — ED Notes (Signed)
Pt states he was discharged earlier today but states he was released too soon and has continued to have the same kind of thoughts

## 2011-05-20 NOTE — ED Notes (Signed)
TC with Sheryl @ [facility]. Confirmed that pt is on the wait list. Nothing needed at this time. Sheryl stated there was not much movement on the list today. 

## 2011-05-20 NOTE — Consult Note (Signed)
Reason for Consult: Schizoaffective disorder, depression and suicidal thoughts Referring Physician: Dr. Leeanne Mannan Parkey is an 36 y.o. male.  HPI: Patient continued to claim been depressed and suicidal. He has not exhibited object to symptoms of depression or suicidal ideations, and behaviors and gestures. Patient was received it telepsychiatric consult and recommended discharged to outpatient psychiatric services. Patient has no motivation to get better and seems like obtaining secondary gains for being sick. Patient has an attitude, does not even make eye contact or talk to the provider. Patient does not discuss or talk about medication management, medication administration, side effects or feature treatment plans.  Patient is lying in his bed covering with bedsheets all over body from head to toe. He has been sleeping who without distress. He has been able to eat without difficulty. He has been without agitation and aggressive behaviors. He has no suicidal behaviors and gestures. Patient has no evidence of auditory/visual hallucinations, delusions or paranoia. Patient has fair insight but poor judgment and impulse control   Past Medical History  Diagnosis Date  . Schizoaffective disorder   . Depression   . Suicidal thoughts   . Anxiety     History reviewed. No pertinent past surgical history.  No family history on file.  Social History:  reports that he has been smoking Cigarettes.  He has a 6 pack-year smoking history. He does not have any smokeless tobacco history on file. He reports that he does not drink alcohol or use illicit drugs.  Allergies: No Known Allergies  Medications: I have reviewed the patient's current medications.  No results found for this or any previous visit (from the past 48 hour(s)).  No results found.  Positive for bad mood, mood swings and Negative attitude. Blood pressure 99/57, pulse 86, temperature 97.9 F (36.6 C), temperature source Oral, resp.  rate 18, SpO2 95.00%.   Assessment/Plan: Schizoaffective disorder most recent episode depression Noncompliance with treatment  Recommended discharged to outpatient psychiatric services.  Sausha Raymond,JANARDHAHA R. 05/20/2011, 11:23 AM
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
## 2011-05-20 NOTE — BH Assessment (Signed)
Assessment Note   Erik [name] is an 36 y.o. male. Initially presented 05/17/11 to [facility] voluntarily, endorsing SI with a plan to overdose. Pt also presented endorsing auditory hallucinations, stating he has been hearing voices that have been telling him to kill himself. Per notes, he told RN that he has a history of schizophrenia and finical difficulties. On reassessment, pt endorses depressive symptoms and SI. Pt appeared irritable and pulled a blanket over his head, refusing to look at this Clinical research associate. Pt also would only answer questions with "uh huh" and "no." Pt denies HI and SA. When asked about [facility], pt did/would not respond.   Pt initially assessed by telepsych who recommended d/c pt. EDP spoke with pt, who stated SI to him and requested further evaluation by psychiatrist. Pt was assessed by [facility] psychiatrist5/15/13, who recommends pt needs inpatient behavioral health treatment for further evaluation and stabilization.   Pt has been observed in ED since 05/17/11.Pt evaluated by psychiatrist who stated the following:   Patient continued to claim being depressed and suicidal. He has not exhibited symptoms of depression or suicidal ideations, and behaviors and gestures. Patient has received it telepsychiatric consult and recommended discharged to outpatient psychiatric services. Patient has no motivation to get better and seems like obtaining secondary gains for being sick. Patient has an attitude, does not even make eye contact or talk to the provider. Patient does not discuss or talk about medication management, medication administration, side effects or feature treatment plans.  Patient is lying in his bed covering with bedsheets all over body from head to toe. He has been sleeping who without distress. He has been able to eat without difficulty. He has been without agitation and aggressive behaviors. He has no suicidal behaviors and gestures. Patient has no evidence of auditory/visual hallucinations,  delusions or paranoia. Patient has fair insight but poor judgment and impulse control  Axis I: Schizoaffective Disorder Axis II: Deferred Axis III:  Past Medical History  Diagnosis Date  . Schizoaffective disorder   . Depression   . Suicidal thoughts   . Anxiety    Axis IV: housing problems, problems with access to health care services and problems with primary support group Axis V: 41-50 serious symptoms  Past Medical History:  Past Medical History  Diagnosis Date  . Schizoaffective disorder   . Depression   . Suicidal thoughts   . Anxiety     History reviewed. No pertinent past surgical history.  Family History: No family history on file.  Social History:  reports that he has been smoking Cigarettes.  He has a 6 pack-year smoking history. He does not have any smokeless tobacco history on file. He reports that he does not drink alcohol or use illicit drugs.  Additional Social History:    Allergies: No Known Allergies  Home Medications:  (Not in a hospital admission)  OB/GYN Status:  No LMP for male patient.  General Assessment Data Location of Assessment: [facility] Living Arrangements: Alone (homeless) Can pt return to current living arrangement?: Yes Admission Status: Voluntary Is patient capable of signing voluntary admission?: Yes Transfer from: Acute Hospital Referral Source: Self/Family/Friend  Education Status Is patient currently in school?: No  Risk to self Suicidal Ideation: No-Not Currently/Within Last 6 Months Suicidal Intent: No-Not Currently/Within Last 6 Months Is patient at risk for suicide?: No Suicidal Plan?: No-Not Currently/Within Last 6 Months What has been your use of drugs/alcohol within the last 12 months?: denies Previous Attempts/Gestures: No How many times?: 0  Other  Self Harm Risks: none Triggers for Past Attempts: None known Intentional Self Injurious Behavior: None Family Suicide History: Unknown Recent stressful life event(s):  Financial Problems Persecutory voices/beliefs?: No Depression: Yes Depression Symptoms: Feeling worthless/self pity Substance abuse history and/or treatment for substance abuse?: Yes Suicide prevention information given to non-admitted patients: Not applicable  Risk to Others Homicidal Ideation: No Thoughts of Harm to Others: No Current Homicidal Intent: No Current Homicidal Plan: No Access to Homicidal Means: No Identified Victim: n/a History of harm to others?: No Assessment of Violence: None Noted Violent Behavior Description: none Does patient have access to weapons?: No Criminal Charges Pending?: No Does patient have a court date: No  Psychosis Hallucinations: None noted Delusions: None noted  Mental Status Report Appear/Hygiene: Disheveled Eye Contact: Other (Comment) (none) Motor Activity: Freedom of movement Speech: Elective mutism Level of Consciousness: Alert Mood: Suspicious;Irritable Affect: Apathetic Anxiety Level: None Thought Processes: Coherent;Relevant Judgement: Impaired Orientation: Person;Place;Time;Situation Obsessive Compulsive Thoughts/Behaviors: None  Cognitive Functioning Concentration: Normal Memory: Recent Intact;Remote Intact IQ: Average Insight: Poor Impulse Control: Poor Appetite: Good Weight Loss: 5  Weight Gain: 0  Sleep: Decreased Vegetative Symptoms: None  Prior Inpatient Therapy Prior Inpatient Therapy: Yes Prior Therapy Dates: April 2013 Prior [facility] Facilty[facility]Provider(s): [facility] Reason for Treatment: voices/depression  Prior Outpatient Therapy Prior Outpatient Therapy: No Prior Therapy Dates: na Prior Therapy Facilty/Provider(s): na Reason for Treatment: na  ADL Screening (condition at time of admission) Patient's cognitive ability adequate to safely complete daily activities?: Yes Patient able to express need for assistance with ADLs?: Yes Independently performs ADLs?: Yes Weakness of Legs: None Weakness of  Arms/Hands: None  Home Assistive Devices/Equipment Home Assistive Devices/Equipment: None    Abuse/Neglect Assessment (Assessment to be complete while patient is alone) Physical Abuse: Denies Verbal Abuse: Denies Sexual Abuse: Denies Exploitation of patient/patient's resources: Denies Self-Neglect: Denies Values / Beliefs Cultural Requests During Hospitalization: None Spiritual Requests During Hospitalization: None   Advance Directives (For Healthcare) Advance Directive: Patient does not have advance directive Pre-existing out of facility DNR order (yellow form or pink MOST form): No Nutrition Screen Diet: Regular Unintentional weight loss greater than 10lbs within the last month: No Problems chewing or swallowing foods and/or liquids: No Home Tube Feeding or Total Parenteral Nutrition (TPN): No Patient appears severely malnourished: No  Additional Information 1:1 In Past 12 Months?: No CIRT Risk: No Elopement Risk: No Does patient have medical clearance?: Yes     Disposition:  Disposition Disposition of Patient: Other dispositions (Psychatrist recommending d/c of pt) Type of inpatient treatment program: Adult Other disposition(s): Other (Comment) (Psychiatrist recommending d/c of pt) Patient referred to: Other (Comment) (Psychiatrist recommending d/c of pt)  On Site Evaluation by:   Reviewed with Physician:     Romeo Apple 05/20/2011 12:18 PM

## 2011-05-20 NOTE — ED Provider Notes (Signed)
Filed Vitals:   05/20/11 0636  BP: 99/57  Pulse: 86  Temp: 97.9 F (36.6 C)  Resp: 18   Pt resting in bed. NAD. No new complaints. Is pending placement.  Raeford Razor, MD 05/20/11 [phone]

## 2011-05-20 NOTE — ED Notes (Signed)
Pt states he is feeling sick and scared  Pt states he has paranoia and is feeling suicidal  Pt denies a plan but then states he would overdose on his pills

## 2011-05-21 ENCOUNTER — Encounter (HOSPITAL_COMMUNITY): Payer: Self-pay | Admitting: Emergency Medicine

## 2011-05-21 LAB — CBC
MCH: 30.3 pg (ref 26.0–34.0)
MCHC: 34.6 g/dL (ref 30.0–36.0)
MCV: 87.5 fL (ref 78.0–100.0)
Platelets: 196 10*3/uL (ref 150–400)
RDW: 14.4 % (ref 11.5–15.5)

## 2011-05-21 LAB — RAPID URINE DRUG SCREEN, HOSP PERFORMED
Opiates: NOT DETECTED
Tetrahydrocannabinol: NOT DETECTED

## 2011-05-21 LAB — BASIC METABOLIC PANEL
CO2: 27 mEq/L (ref 19–32)
Calcium: 9.5 mg/dL (ref 8.4–10.5)
Creatinine, Ser: 0.96 mg/dL (ref 0.50–1.35)
GFR calc non Af Amer: >90 mL/min (ref >90)

## 2011-05-21 MED ORDER — ZOLPIDEM TARTRATE 5 MG PO TABS
5.0000 mg | ORAL_TABLET | Freq: Every evening | ORAL | Status: DC | PRN
  Administered 2011-05-22: 5 mg via ORAL
  Filled 2011-05-21: qty 1

## 2011-05-21 MED ORDER — BENZTROPINE MESYLATE 1 MG PO TABS
1.0000 mg | ORAL_TABLET | ORAL | Status: DC
  Administered 2011-05-21 – 2011-05-23 (×7): 1 mg via ORAL
  Filled 2011-05-21 (×7): qty 1

## 2011-05-21 MED ORDER — ACETAMINOPHEN 325 MG PO TABS
650.0000 mg | ORAL_TABLET | ORAL | Status: DC | PRN
  Administered 2011-05-21: 650 mg via ORAL
  Filled 2011-05-21: qty 2

## 2011-05-21 MED ORDER — IBUPROFEN 600 MG PO TABS: 600.0000 mg | ORAL_TABLET | Freq: Three times a day (TID) | ORAL | Status: DC | PRN

## 2011-05-21 MED ORDER — DIVALPROEX SODIUM ER 500 MG PO TB24
1000.0000 mg | ORAL_TABLET | Freq: Every day | ORAL | Status: DC
  Administered 2011-05-21 – 2011-05-23 (×3): 1000 mg via ORAL
  Filled 2011-05-21 (×4): qty 2

## 2011-05-21 MED ORDER — ONDANSETRON HCL 4 MG PO TABS: 4.0000 mg | ORAL_TABLET | Freq: Three times a day (TID) | ORAL | Status: DC | PRN

## 2011-05-21 MED ORDER — DIVALPROEX SODIUM ER 500 MG PO TB24
500.0000 mg | ORAL_TABLET | Freq: Every day | ORAL | Status: DC
  Filled 2011-05-21: qty 1

## 2011-05-21 MED ORDER — SERTRALINE HCL 50 MG PO TABS
100.0000 mg | ORAL_TABLET | Freq: Every day | ORAL | Status: DC
  Administered 2011-05-21 – 2011-05-23 (×3): 100 mg via ORAL
  Filled 2011-05-21 (×3): qty 2

## 2011-05-21 MED ORDER — OLANZAPINE 5 MG PO TABS
5.0000 mg | ORAL_TABLET | Freq: Every day | ORAL | Status: DC
  Administered 2011-05-21 – 2011-05-23 (×3): 5 mg via ORAL
  Filled 2011-05-21 (×3): qty 1

## 2011-05-21 MED ORDER — ALUM & MAG HYDROXIDE-SIMETH 200-200-20 MG/5ML PO SUSP: 30.0000 mL | ORAL | Status: DC | PRN

## 2011-05-21 NOTE — ED Notes (Signed)
Up to the bathroom 

## 2011-05-21 NOTE — BH Assessment (Addendum)
Assessment Note   Erik [name] is an 36 y.o. male who initially presented to [facility] due to depression and SI with plan to OD. During reassessment  patient was observed by writer to exhibit irritability (placing his bed cover over his head and not providing direct eye contact). Patient reported to Clinical research associate that he does currently feel suicidal although he has no active plan. Patient endorsed experiencing auditory and visual hallucinations last night although he would not describe in detail this encounter. Patient verbalized that he has a hx of "schizophrenia" and would not disclose if he has been consuming his psychiatric medications as prescribed. Patient is currently on the wait list for [facility] at this time.   Axis I: Schizoaffective Disorder Axis II: Deferred Axis III:  Past Medical History  Diagnosis Date  . Schizoaffective disorder   . Depression   . Suicidal thoughts   . Anxiety    Axis IV: housing problems, other psychosocial or environmental problems and problems related to social environment Axis V: 31-40 impairment in reality testing  Past Medical History:  Past Medical History  Diagnosis Date  . Schizoaffective disorder   . Depression   . Suicidal thoughts   . Anxiety     History reviewed. No pertinent past surgical history.  Family History: History reviewed. No pertinent family history.  Social History:  reports that he has been smoking Cigarettes.  He has a 6 pack-year smoking history. He does not have any smokeless tobacco history on file. He reports that he does not drink alcohol or use illicit drugs.  Additional Social History:  Alcohol / Drug Use History of alcohol / drug use?: No history of alcohol / drug abuse Allergies: No Known Allergies  Home Medications:  (Not in a hospital admission)  OB/GYN Status:  No LMP for male patient.  General Assessment Data Location of [facility]: [facility] Living Arrangements: Other  (Comment) (Homeless) Can pt return to current living arrangement?: Yes Admission Status: Voluntary Is patient capable of signing voluntary admission?: Yes Transfer from: Acute Hospital Referral Source: Self/Family/Friend  Education Status Is patient currently in school?: No  Risk to self Suicidal Ideation: Yes-Currently Present Suicidal Intent: No Is patient at risk for suicide?: Yes Suicidal Plan?: No-Not Currently/Within Last 6 Months Access to Means: Yes Specify Access to Suicidal Means: Access to streets and objects What has been your use of drugs/alcohol within the last 12 months?: Pt denies Previous Attempts/Gestures: No How many times?: 0  Other Self Harm Risks: None Triggers for Past Attempts: None known Intentional Self Injurious Behavior: None Family Suicide History: Unknown Recent stressful life event(s): Financial Problems;Other (Comment) (Psychosis) Persecutory voices/beliefs?: No Depression: Yes Depression Symptoms: Feeling worthless/self pity Substance abuse history and/or treatment for substance abuse?: No Suicide prevention information given to non-admitted patients: Not applicable  Risk to Others Homicidal Ideation: No Thoughts of Harm to Others: No Current Homicidal Intent: No Current Homicidal Plan: No Access to Homicidal Means: No Identified Victim: N/A History of harm to others?: No Assessment of Violence: None Noted Violent Behavior Description: None Does patient have access to weapons?: No Criminal Charges Pending?: No Does patient have a court date: No  Psychosis Hallucinations: Auditory;Visual Delusions: None noted  Mental Status Report Appear/Hygiene: Disheveled Eye Contact: Other (Comment) (None) Motor Activity: Freedom of movement Speech: Elective mutism Level of Consciousness: Quiet/awake Mood: Suspicious;Irritable Affect: Apathetic;Irritable Anxiety Level: None Thought Processes: Coherent;Relevant Judgement:  Impaired Orientation: Person;Place;Time;Situation Obsessive Compulsive Thoughts/Behaviors: None  Cognitive Functioning Concentration: Normal Memory:  Recent Intact;Remote Intact IQ: Average Insight: Poor Impulse Control: Poor Appetite: Good Weight Loss: 5  (Per previous assessment) Weight Gain: 0  Sleep: Decreased Total Hours of Sleep: 6  Vegetative Symptoms: None  Prior Inpatient Therapy Prior Inpatient Therapy: Yes Prior Therapy Dates: 04/2011 Prior Therapy Facilty/Provider(s): [facility] Reason for Treatment: AH/Depression  Prior Outpatient Therapy Prior Outpatient Therapy: No Prior Therapy Dates: None Prior Therapy Facilty/Provider(s): N/A Reason for Treatment: N/A  ADL Screening (condition at time of admission) Patient's cognitive ability adequate to safely complete daily activities?: Yes Patient able to express need for assistance with ADLs?: Yes Independently performs ADLs?: Yes Weakness of Legs: None Weakness of Arms/Hands: None  Home Assistive Devices/Equipment Home Assistive Devices/Equipment: None  Therapy Consults (therapy consults require a physician order) PT Evaluation Needed: No OT Evalulation Needed: No SLP Evaluation Needed: No Abuse/Neglect Assessment (Assessment to be complete while patient is alone) Physical Abuse: Denies Verbal Abuse: Denies Sexual Abuse: Denies Exploitation of patient/patient's resources: Denies Self-Neglect: Denies Values / Beliefs Cultural Requests During Hospitalization: None Spiritual Requests During Hospitalization: None   Advance Directives (For Healthcare) Advance Directive: Patient does not have advance directive;Patient would not like information    Additional Information 1:1 In Past 12 Months?: No CIRT Risk: No Elopement Risk: No Does patient have medical clearance?: Yes     Disposition: Currently on [facility] wait list  Disposition Disposition of Patient: Inpatient treatment program Type of  inpatient treatment program: Adult  On Site Evaluation by:   Reviewed with Physician:     Janann Colonel C 05/21/2011 9:53 AM

## 2011-05-21 NOTE — ED Provider Notes (Signed)
Paranoia and SI with plan to overdose on pills.  On [facility] waiting list.  On assessment this am has no complaints at this time.  Medically clear for psychiatric evaluation.  RRR Lungs CTAB Abd soft, NT, ND  Labs reviewed and temp holding orders are placed.  Dayton Bailiff, MD 05/21/11 [phone]
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
## 2011-05-22 NOTE — ED Notes (Signed)
Up to the bathroom 

## 2011-05-22 NOTE — BH Assessment (Signed)
Assessment Note   Erik [name] is an 36 y.o. male who initially presented to [facility] Emergency Department with moderate depression, suicidal ideations, and auditory hallucinations. During reassessment patient covered his entire body with bed lining and did not provide eye contact with Clinical research associate. Patient continues to endorse having current suicidal ideations, stating "I just keep having the thoughts. I can just take some pills or something." Patient reported that he does hear voices telling him "everything will be okay. Don't worry. And then the voices fade out." Patient stated that the voices do not command him to hurt others at this time. Patient was observed to be calm and cooperative during reassessment. Patient denies current HI/VH but does endorse depressive symptoms of social isolation, irritability, excessive sleep. Patient is currently on wait list at [facility].   Axis I: Schizoaffective Disorder Axis II: Deferred Axis III:  Past Medical History  Diagnosis Date  . Schizoaffective disorder   . Depression   . Suicidal thoughts   . Anxiety    Axis IV: housing problems, other psychosocial or environmental problems and problems related to social environment Axis V: 31-40 impairment in reality testing  Past Medical History:  Past Medical History  Diagnosis Date  . Schizoaffective disorder   . Depression   . Suicidal thoughts   . Anxiety     History reviewed. No pertinent past surgical history.  Family History: History reviewed. No pertinent family history.  Social History:  reports that he has been smoking Cigarettes.  He has a 6 pack-year smoking history. He does not have any smokeless tobacco history on file. He reports that he does not drink alcohol or use illicit drugs.  Additional Social History:  Alcohol / Drug Use History of alcohol / drug use?: No history of alcohol / drug abuse Allergies: No Known Allergies  Home Medications:  (Not in a hospital  admission)  OB/GYN Status:  No LMP for male patient.  General Assessment Data Location of Assessment: [facility] ED Living Arrangements: Other (Comment) (Homeless) Can pt return to current living arrangement?: Yes Admission Status: Voluntary Is patient capable of signing voluntary admission?: Yes Transfer from: Acute Hospital Referral Source: Self/Family/Friend  Education Status Is patient currently in school?: No  Risk to self Suicidal Ideation: Yes-Currently Present Suicidal Intent: Yes-Currently Present Is patient at risk for suicide?: Yes Suicidal Plan?: Yes-Currently Present (Pt reported "I'll just take some pills. I know what to do.") Specify Current Suicidal Plan: OD on pills Access to Means: Yes Specify Access to Suicidal Means: Access to pills, objects, and streets What has been your use of drugs/alcohol within the last 12 months?: Pt denies Previous Attempts/Gestures: No How many times?: 0  Other Self Harm Risks: None Triggers for Past Attempts: None known Intentional Self Injurious Behavior: None Family Suicide History: Unknown Recent stressful life event(s): Financial Problems;Other (Comment) (Homeless) Persecutory voices/beliefs?: No Depression: Yes Depression Symptoms: Feeling worthless/self pity;Loss of interest in usual pleasures;Isolating Substance abuse history and/or treatment for substance abuse?: No Suicide prevention information given to non-admitted patients: Not applicable  Risk to Others Homicidal Ideation: No Thoughts of Harm to Others: No Current Homicidal Intent: No Current Homicidal Plan: No Access to Homicidal Means: No Identified Victim: None Reported History of harm to others?: No Assessment of Violence: None Noted Violent Behavior Description: N/A Does patient have access to weapons?: No Criminal Charges Pending?: No Does patient have a court date: No  Psychosis Hallucinations: Auditory Delusions: None noted  Mental Status  Report Appear/Hygiene: Disheveled Eye Contact:  Other (Comment) (None) Motor Activity: Freedom of movement Speech: Soft Level of Consciousness: Quiet/awake Mood: Depressed;Suspicious;Empty Affect: Depressed;Sad Anxiety Level: None Thought Processes: Coherent;Relevant Judgement: Impaired Orientation: Person;Place;Time;Situation Obsessive Compulsive Thoughts/Behaviors: None  Cognitive Functioning Concentration: Normal Memory: Recent Intact;Remote Intact IQ: Average Insight: Poor Impulse Control: Poor Appetite: Good Weight Loss: 0  Weight Gain: 0  Sleep: Decreased Total Hours of Sleep: 6  Vegetative Symptoms: None  Prior Inpatient Therapy Prior Inpatient Therapy: Yes Prior Therapy Dates: 04/2011 Prior Therapy Facilty/Provider(s): [facility] Reason for Treatment: AH/Depression  Prior Outpatient Therapy Prior Outpatient Therapy: No Prior Therapy Dates: None Prior Therapy Facilty/Provider(s): N/A Reason for Treatment: N/A  ADL Screening (condition at time of admission) Patient's cognitive ability adequate to safely complete daily activities?: Yes Patient able to express need for assistance with ADLs?: Yes Independently performs ADLs?: Yes Weakness of Legs: None Weakness of Arms/Hands: None  Home Assistive Devices/Equipment Home Assistive Devices/Equipment: None  Therapy Consults (therapy consults require a physician order) PT Evaluation Needed: No OT Evalulation Needed: No SLP Evaluation Needed: No Abuse/Neglect Assessment (Assessment to be complete while patient is alone) Physical Abuse: Denies Verbal Abuse: Denies Sexual Abuse: Denies Exploitation of patient/patient's resources: Denies Self-Neglect: Denies Values / Beliefs Cultural Requests During Hospitalization: None Spiritual Requests During Hospitalization: None Consults Spiritual Care Consult Needed: No Social Work Consult Needed: No Merchant navy officer (For Healthcare) Advance Directive: Patient does not have  advance directive;Patient would not like information    Additional Information 1:1 In Past 12 Months?: No CIRT Risk: No Elopement Risk: No Does patient have medical clearance?: Yes     Disposition:  Disposition Disposition of Patient: Inpatient treatment program Type of inpatient treatment program: Adult  On Site Evaluation by:  Self Reviewed with Physician:     Haskel Khan 05/22/2011 2:47 PM

## 2011-05-22 NOTE — ED Notes (Signed)
Erik [name] CSW into see 

## 2011-05-23 MED ORDER — SERTRALINE HCL 100 MG PO TABS: 100.0000 mg | ORAL_TABLET | Freq: Every day | ORAL | Status: DC

## 2011-05-23 MED ORDER — OLANZAPINE 5 MG PO TABS: 5.0000 mg | ORAL_TABLET | Freq: Every day | ORAL | Status: DC

## 2011-05-23 MED ORDER — BENZTROPINE MESYLATE 1 MG PO TABS: 1.0000 mg | ORAL_TABLET | ORAL | Status: DC

## 2011-05-23 MED ORDER — DIVALPROEX SODIUM ER 500 MG PO TB24: 1000.0000 mg | ORAL_TABLET | Freq: Every day | ORAL | Status: DC

## 2011-05-23 NOTE — ED Notes (Signed)
Pt discharged in safe and stable condition. Outpatient referrals given.

## 2011-05-23 NOTE — Progress Notes (Signed)
Checked in w/ pt and invited pt to behavioral health group in Psych ED. Pt declined, stated he was having "bad thoughts" (re: suicide), stated he needed help financially and "no one will help me." Pt stated he was not working and was living in a motel, was about to be out of money. Writer offered to print off financial assistance program for pt, pt accepted offer. Writer will print off info and f/u.  Mindie Rawdon B MS, LPCA, NCC

## 2011-05-23 NOTE — BHH Counselor (Signed)
Pt recv'd another telepsych from Dr. Trisha Mangle who reports the following for pt: pt no longer endorses SI and states he feels better.  Pt was able to answer all questions posed to him and was cooperative and calm during assessment.  Pt was previously provided community resources for housing and outpt mental health services, however this Clinical research associate provided addt'l resources for pt.  Pt will be d/c'd, Dr. Linwood Dibbles agrees with disposition by Dr. Trisha Mangle.

## 2011-05-23 NOTE — ED Notes (Signed)
telepsych for re-evaluation recommended by ACT for pt dispo.

## 2011-05-23 NOTE — Discharge Instructions (Signed)
Depression  Depression is a strong emotion of feeling unhappy that can last for weeks, months, or even longer. Depression causes problems with the ability to function in life. It upsets your:   Relationships.   Sleep.   Eating habits.   Work habits.  HOME CARE  Take all medicine as told by your doctor.   Talk with a therapist, counselor, or friend.   Eat a healthy diet.   Exercise regularly.   Do not drink alcohol or use drugs.  GET HELP RIGHT AWAY IF: You start to have thoughts about hurting yourself or others. MAKE SURE YOU:  Understand these instructions.   Will watch your condition.   Will get help right away if you are not doing well or get worse.  Document Released: 01/22/2010 Document Revised: 12/09/2010 Document Reviewed: 01/22/2010 [employer] Patient Information 2012 [employer]. 

## 2011-05-23 NOTE — ED Provider Notes (Signed)
Pt assessed by Telepsych.  Pt has improved with treatment in the ED.  Stable for discharge.  Pt will be given a prescription to make sure he has his psychiatric medications.  Celene Kras, MD 05/23/11 2132

## 2011-05-23 NOTE — ED Notes (Signed)
telepsych done 

## 2011-05-23 NOTE — ED Notes (Signed)
Pt resing in bed not bothering anyone

## 2011-05-23 NOTE — ED Notes (Signed)
Pt is sleeping off and on and is staying in his room.  He gets up and goes to the BR on his own and is behaving at present

## 2011-10-01 ENCOUNTER — Emergency Department (HOSPITAL_COMMUNITY)
Admission: EM | Admit: 2011-10-01 | Discharge: 2011-10-02 | Disposition: A | Discharge status: Home / Self Care | Payer: Medicaid Other | Attending: Emergency Medicine | Admitting: Emergency Medicine

## 2011-10-01 ENCOUNTER — Encounter (HOSPITAL_COMMUNITY): Payer: Self-pay | Admitting: Emergency Medicine

## 2011-10-01 ENCOUNTER — Emergency Department (HOSPITAL_COMMUNITY): Payer: Medicaid Other

## 2011-10-01 DIAGNOSIS — F259 Schizoaffective disorder, unspecified: Secondary | ICD-10-CM | POA: Insufficient documentation

## 2011-10-01 DIAGNOSIS — R0600 Dyspnea, unspecified: Secondary | ICD-10-CM

## 2011-10-01 DIAGNOSIS — F17200 Nicotine dependence, unspecified, uncomplicated: Secondary | ICD-10-CM | POA: Insufficient documentation

## 2011-10-01 DIAGNOSIS — R45851 Suicidal ideations: Secondary | ICD-10-CM | POA: Insufficient documentation

## 2011-10-01 DIAGNOSIS — R0609 Other forms of dyspnea: Secondary | ICD-10-CM | POA: Insufficient documentation

## 2011-10-01 DIAGNOSIS — R0989 Other specified symptoms and signs involving the circulatory and respiratory systems: Secondary | ICD-10-CM | POA: Insufficient documentation

## 2011-10-01 DIAGNOSIS — F411 Generalized anxiety disorder: Secondary | ICD-10-CM | POA: Insufficient documentation

## 2011-10-01 NOTE — ED Notes (Signed)
Pt called EMS for complaint of shortness of breath and tremors; states stopped taking Cogentin for tremors because it made him short of breath; states is supposed to be on Haldol but has Trazadone; states took 2 Trazadone tonight instead of 1 because of the tremors and developed shortness of breath after taking the extra Trazadone; denied chest pain with EMS; EMS reports lungs clear and O2 sats 98% on room air; EMS reports no signs or symptoms of respiratory distress.

## 2011-10-02 LAB — CBC WITH DIFFERENTIAL/PLATELET
Basophils Absolute: 0 10*3/uL (ref 0.0–0.1)
Eosinophils Relative: 1 % (ref 0–5)
HCT: 44.1 % (ref 39.0–52.0)
Lymphocytes Relative: 31 % (ref 12–46)
Lymphs Abs: 2.2 10*3/uL (ref 0.7–4.0)
MCV: 89.6 fL (ref 78.0–100.0)
Monocytes Absolute: 0.6 10*3/uL (ref 0.1–1.0)
Neutro Abs: 4.2 10*3/uL (ref 1.7–7.7)
RBC: 4.92 MIL/uL (ref 4.22–5.81)
RDW: 14 % (ref 11.5–15.5)
WBC: 7.1 10*3/uL (ref 4.0–10.5)

## 2011-10-02 LAB — COMPREHENSIVE METABOLIC PANEL
ALT: 41 U/L (ref 0–53)
AST: 28 U/L (ref 0–37)
CO2: 28 mEq/L (ref 19–32)
Chloride: 101 mEq/L (ref 96–112)
Creatinine, Ser: 0.95 mg/dL (ref 0.50–1.35)
GFR calc Af Amer: >90 mL/min (ref >90)
GFR calc non Af Amer: >90 mL/min (ref >90)
Glucose, Bld: 91 mg/dL (ref 70–99)
Sodium: 137 mEq/L (ref 135–145)
Total Bilirubin: 0.3 mg/dL (ref 0.3–1.2)

## 2011-10-02 LAB — URINALYSIS, ROUTINE W REFLEX MICROSCOPIC
Bilirubin Urine: NEGATIVE
Hgb urine dipstick: NEGATIVE
Ketones, ur: NEGATIVE mg/dL
Nitrite: NEGATIVE
Protein, ur: NEGATIVE mg/dL
Urobilinogen, UA: 1 mg/dL (ref 0.0–1.0)

## 2011-10-02 NOTE — ED Provider Notes (Signed)
History     CSN: [id number]  Arrival date & time 10/01/11  2029   First MD Initiated Contact with Patient 10/02/11 0138      Chief Complaint  Patient presents with  . Shortness of Breath  . Tremors    (Consider location/radiation/quality/duration/timing/severity/associated sxs/prior treatment) HPI Pt with p/w SOB starting this evening while lying bed. States he felt his heart stop for unknown period. He now is having tremors. No chest pain, cough, fever, chills, lower ext swelling or pain. Pt states he is feeling better at this time. No SI Past Medical History  Diagnosis Date  . Schizoaffective disorder   . Depression   . Suicidal thoughts   . Anxiety     History reviewed. No pertinent past surgical history.  No family history on file.  History  Substance Use Topics  . Smoking status: Current Every Day Smoker -- 0.5 packs/day for 12 years    Types: Cigarettes  . Smokeless tobacco: Not on file  . Alcohol Use: Yes     social/rare      Review of Systems  Constitutional: Negative for fever and chills.  Respiratory: Positive for shortness of breath. Negative for cough and wheezing.   Cardiovascular: Negative for chest pain, palpitations and leg swelling.  Gastrointestinal: Negative for nausea, vomiting and abdominal pain.  Skin: Negative for rash and wound.  Neurological: Positive for tremors. Negative for dizziness, syncope, weakness, light-headedness, numbness and headaches.  Psychiatric/Behavioral: Negative for suicidal ideas.    Allergies  Review of patient's allergies indicates no known allergies.  Home Medications   Current Outpatient Rx  Name Route Sig Dispense Refill  . BENZTROPINE MESYLATE 1 MG PO TABS Oral Take 1 tablet (1 mg total) by mouth 2 (two) times daily in the am and at bedtime.. For involuntary movements 60 tablet 0  . BENZTROPINE MESYLATE 1 MG PO TABS Oral Take 1 tablet (1 mg total) by mouth 2 (two) times daily in the am and at bedtime.. 60  tablet 1  . DIVALPROEX SODIUM ER 500 MG PO TB24  For mood control.   Take 1 tablet daily, and 3 tablets at Bedtime 120 tablet 0  . DIVALPROEX SODIUM ER 500 MG PO TB24 Oral Take 2 tablets (1,000 mg total) by mouth daily. 60 tablet 1  . OLANZAPINE 10 MG PO TABS Oral Take 1 tablet (10 mg total) by mouth at bedtime. 30 tablet 0  . OLANZAPINE 10 MG PO TABS Oral Take 1 tablet (10 mg total) by mouth at bedtime. 30 tablet 0  . OLANZAPINE 5 MG PO TABS Oral Take 1 tablet (5 mg total) by mouth at bedtime. 30 tablet 1  . SERTRALINE HCL 100 MG PO TABS Oral Take 1 tablet (100 mg total) by mouth daily. For depression 30 tablet 0  . SERTRALINE HCL 100 MG PO TABS Oral Take 1 tablet (100 mg total) by mouth daily. 30 tablet 1    BP 90/57  Pulse 85  Temp 98 F (36.7 C) (Oral)  Resp 26  SpO2 99%  Physical Exam  Nursing note and vitals reviewed. Constitutional: He is oriented to person, place, and time. He appears well-developed and well-nourished. No distress.  HENT:  Head: Normocephalic and atraumatic.  Mouth/Throat: Oropharynx is clear and moist.  Eyes: EOM are normal. Pupils are equal, round, and reactive to light.  Neck: Normal range of motion. Neck supple.  Cardiovascular: Normal rate and regular rhythm.  Exam reveals no gallop and no friction rub.  No murmur heard. Pulmonary/Chest: Effort normal and breath sounds normal. No respiratory distress. He has no wheezes. He has no rales. He exhibits no tenderness.  Abdominal: Soft. Bowel sounds are normal. He exhibits no mass. There is no tenderness. There is no rebound and no guarding.  Musculoskeletal: Normal range of motion. He exhibits no edema and no tenderness.  Neurological: He is alert and oriented to person, place, and time.       5/5 motor in all ext, sensation intact. Tremor of upper ext. When lying in bed no tremor noted.   Skin: Skin is warm and dry. No rash noted. No erythema.  Psychiatric: He has a normal mood and affect. His behavior  is normal.    ED Course  Procedures (including critical care time)  Labs Reviewed  URINALYSIS, ROUTINE W REFLEX MICROSCOPIC - Abnormal; Notable for the following:    APPearance CLOUDY (*)     All other components within normal limits  CBC WITH DIFFERENTIAL  COMPREHENSIVE METABOLIC PANEL  TROPONIN I   Dg Chest 2 View  10/01/2011  *RADIOLOGY REPORT*  Clinical Data: Short of breath  CHEST - 2 VIEW  Comparison: Chest radiograph 01/11/2011  Findings: Normal mediastinum and cardiac silhouette.  Normal pulmonary  vasculature.  No evidence of effusion, infiltrate, or pneumothorax.  No acute bony abnormality.  IMPRESSION: No acute cardiopulmonary process.   Original Report Authenticated By: Genevive Bi, M.D.      1. Dyspnea      Date: 10/02/2011  Rate: 97  Rhythm: normal sinus rhythm  QRS Axis: normal  Intervals: normal  ST/T Wave abnormalities: normal  Conduction Disutrbances:none  Narrative Interpretation:   Old EKG Reviewed: none available    MDM    No apparent emergent condition present. Suspect related to anxiety. Return to ED for worsening symptoms or concerns      Loren Racer, MD 10/02/11 [phone]

## 2011-10-10 ENCOUNTER — Emergency Department (HOSPITAL_COMMUNITY)
Admission: EM | Admit: 2011-10-10 | Discharge: 2011-10-11 | Disposition: A | Discharge status: Home / Self Care | Payer: Medicaid Other | Attending: Emergency Medicine | Admitting: Emergency Medicine

## 2011-10-10 DIAGNOSIS — F17200 Nicotine dependence, unspecified, uncomplicated: Secondary | ICD-10-CM | POA: Insufficient documentation

## 2011-10-10 DIAGNOSIS — F329 Major depressive disorder, single episode, unspecified: Secondary | ICD-10-CM | POA: Insufficient documentation

## 2011-10-10 DIAGNOSIS — R259 Unspecified abnormal involuntary movements: Secondary | ICD-10-CM | POA: Insufficient documentation

## 2011-10-10 DIAGNOSIS — R110 Nausea: Secondary | ICD-10-CM | POA: Insufficient documentation

## 2011-10-10 DIAGNOSIS — F411 Generalized anxiety disorder: Secondary | ICD-10-CM | POA: Insufficient documentation

## 2011-10-10 DIAGNOSIS — R45851 Suicidal ideations: Secondary | ICD-10-CM | POA: Insufficient documentation

## 2011-10-10 DIAGNOSIS — Z79899 Other long term (current) drug therapy: Secondary | ICD-10-CM | POA: Insufficient documentation

## 2011-10-10 DIAGNOSIS — F3289 Other specified depressive episodes: Secondary | ICD-10-CM | POA: Insufficient documentation

## 2011-10-10 LAB — COMPREHENSIVE METABOLIC PANEL
Alkaline Phosphatase: 54 U/L (ref 39–117)
BUN: 8 mg/dL (ref 6–23)
Calcium: 9.4 mg/dL (ref 8.4–10.5)
Creatinine, Ser: 0.85 mg/dL (ref 0.50–1.35)
GFR calc Af Amer: >90 mL/min (ref >90)
Glucose, Bld: 127 mg/dL — ABNORMAL HIGH (ref 70–99)
Potassium: 3.4 mEq/L — ABNORMAL LOW (ref 3.5–5.1)
Total Protein: 6.9 g/dL (ref 6.0–8.3)

## 2011-10-10 LAB — ETHANOL: Alcohol, Ethyl (B): <11 mg/dL (ref 0–11)

## 2011-10-10 LAB — CBC
HCT: 41.8 % (ref 39.0–52.0)
Hemoglobin: 14.5 g/dL (ref 13.0–17.0)
MCH: 30.9 pg (ref 26.0–34.0)
MCHC: 34.7 g/dL (ref 30.0–36.0)
MCV: 88.9 fL (ref 78.0–100.0)
RDW: 13.5 % (ref 11.5–15.5)

## 2011-10-10 LAB — SALICYLATE LEVEL: Salicylate Lvl: <2 mg/dL — ABNORMAL LOW (ref 2.8–20.0)

## 2011-10-10 MED ORDER — IBUPROFEN 200 MG PO TABS: 600.0000 mg | ORAL_TABLET | Freq: Three times a day (TID) | ORAL | Status: DC | PRN

## 2011-10-10 MED ORDER — DIVALPROEX SODIUM ER 500 MG PO TB24
1000.0000 mg | ORAL_TABLET | Freq: Every day | ORAL | Status: DC
Start: 2011-10-11 — End: 2011-10-11
  Filled 2011-10-10: qty 2

## 2011-10-10 MED ORDER — BENZTROPINE MESYLATE 1 MG PO TABS: 1.0000 mg | ORAL_TABLET | ORAL | Status: DC

## 2011-10-10 MED ORDER — SERTRALINE HCL 50 MG PO TABS
100.0000 mg | ORAL_TABLET | Freq: Every day | ORAL | Status: DC
Start: 2011-10-11 — End: 2011-10-11

## 2011-10-10 MED ORDER — OLANZAPINE 5 MG PO TABS
10.0000 mg | ORAL_TABLET | Freq: Every day | ORAL | Status: DC
Start: 2011-10-11 — End: 2011-10-11
  Administered 2011-10-11: 10 mg via ORAL
  Filled 2011-10-10: qty 2

## 2011-10-10 MED ORDER — NICOTINE 21 MG/24HR TD PT24
21.0000 mg | MEDICATED_PATCH | Freq: Every day | TRANSDERMAL | Status: DC
Start: 2011-10-11 — End: 2011-10-11

## 2011-10-10 MED ORDER — LORAZEPAM 1 MG PO TABS
1.0000 mg | ORAL_TABLET | Freq: Three times a day (TID) | ORAL | Status: DC | PRN
  Administered 2011-10-11: 1 mg via ORAL
  Filled 2011-10-10: qty 1

## 2011-10-10 MED ORDER — ONDANSETRON HCL 4 MG PO TABS: 4.0000 mg | ORAL_TABLET | Freq: Three times a day (TID) | ORAL | Status: DC | PRN

## 2011-10-10 NOTE — ED Notes (Signed)
Pt sts he is SI. Pt plans on taking pills.pt denies taking any pills.VSS.pwd

## 2011-10-10 NOTE — ED Provider Notes (Signed)
History     CSN: [id number]  Arrival date & time 10/10/11  2158   First MD Initiated Contact with Patient 10/10/11 2339      Chief Complaint  Patient presents with  . Medical Clearance    (Consider location/radiation/quality/duration/timing/severity/associated sxs/prior treatment) HPI Comments: Patient presents with complaint of suicidal ideation. Patient states he feels like he wants take a bunch of pills in order to kill himself. Patient denies other substance abuse. Patient denies medical complaints other than nausea and a generalized tremor which he states he's had for 2 months due to "Haldol".  Onset gradual. Course is constant. Nothing makes symptoms better or worse.  The history is provided by the patient.    Past Medical History  Diagnosis Date  . Schizoaffective disorder   . Depression   . Suicidal thoughts   . Anxiety     No past surgical history on file.  No family history on file.  History  Substance Use Topics  . Smoking status: Current Every Day Smoker -- 0.5 packs/day for 12 years    Types: Cigarettes  . Smokeless tobacco: Not on file  . Alcohol Use: Yes     social/rare      Review of Systems  Constitutional: Negative for fever.  HENT: Negative for sore throat and rhinorrhea.   Eyes: Negative for redness.  Respiratory: Negative for cough.   Cardiovascular: Negative for chest pain.  Gastrointestinal: Positive for nausea. Negative for vomiting, abdominal pain and diarrhea.  Genitourinary: Negative for dysuria.  Musculoskeletal: Negative for myalgias.  Skin: Negative for rash.  Neurological: Positive for tremors. Negative for headaches.  Psychiatric/Behavioral: Negative for suicidal ideas.    Allergies  Review of patient's allergies indicates no known allergies.  Home Medications   Current Outpatient Rx  Name Route Sig Dispense Refill  . BENZTROPINE MESYLATE 1 MG PO TABS Oral Take 1 tablet (1 mg total) by mouth 2 (two) times daily in the am  and at bedtime.. 60 tablet 1  . DIVALPROEX SODIUM ER 500 MG PO TB24 Oral Take 2 tablets (1,000 mg total) by mouth daily. 60 tablet 1  . SERTRALINE HCL 100 MG PO TABS Oral Take 1 tablet (100 mg total) by mouth daily. 30 tablet 1  . OLANZAPINE 10 MG PO TABS Oral Take 1 tablet (10 mg total) by mouth at bedtime. 30 tablet 0    BP 113/64  Pulse 85  Temp 98.6 F (37 C) (Oral)  Resp 20  SpO2 97%  Physical Exam  Nursing note and vitals reviewed. Constitutional: He appears well-developed and well-nourished.  HENT:  Head: Normocephalic and atraumatic.  Eyes: Conjunctivae normal are normal. Right eye exhibits no discharge. Left eye exhibits no discharge.  Neck: Normal range of motion. Neck supple.  Cardiovascular: Normal rate, regular rhythm and normal heart sounds.   Pulmonary/Chest: Effort normal and breath sounds normal.  Abdominal: Soft. There is no tenderness.  Neurological: He is alert.       Generalized tremor  Skin: Skin is warm and dry.  Psychiatric:       Flat affect    ED Course  Procedures (including critical care time)  Labs Reviewed  COMPREHENSIVE METABOLIC PANEL - Abnormal; Notable for the following:    Potassium 3.4 (*)     Glucose, Bld 127 (*)     Albumin 3.4 (*)     All other components within normal limits  SALICYLATE LEVEL - Abnormal; Notable for the following:    Salicylate Lvl <2.0 (*)  All other components within normal limits  ACETAMINOPHEN LEVEL  CBC  ETHANOL  URINE RAPID DRUG SCREEN (HOSP PERFORMED)   No results found.   1. Suicidal ideation     11:44 PM Patient seen and examined.   Vital signs reviewed and are as follows: Filed Vitals:   10/10/11 2242  BP: 113/64  Pulse: 85  Temp: 98.6 F (37 C)  Resp: 20   1:19 AM ACT aware of patient. Handoff to Dr. Freida Busman. Holding orders completed.    MDM  Pending ACT eval. Uncertain etiology of tremor, possibly 2/2 medications, but has been ongoing.         Renne Crigler, [place] 10/11/11  0120

## 2011-10-11 MED ORDER — DIPHENHYDRAMINE HCL 25 MG PO CAPS
50.0000 mg | ORAL_CAPSULE | Freq: Once | ORAL | Status: AC
  Administered 2011-10-11: 50 mg via ORAL
  Filled 2011-10-11: qty 2

## 2011-10-11 MED ORDER — DIPHENHYDRAMINE HCL 25 MG PO CAPS: 25.0000 mg | ORAL_CAPSULE | Freq: Once | ORAL | Status: DC

## 2011-10-11 NOTE — ED Notes (Signed)
Pt in room talking to MD via telepsych with tech at bedside.

## 2011-10-11 NOTE — ED Provider Notes (Signed)
Medical screening examination/treatment/procedure(s) were conducted as a shared visit with non-physician practitioner(s) and myself.  I personally evaluated the patient during the encounter  Patient has been evaluated by psychiatrist on call and has been cleared for discharge  Toy Baker, MD 10/11/11 [phone]

## 2011-10-11 NOTE — ED Provider Notes (Signed)
Medical screening examination/treatment/procedure(s) were performed by non-physician practitioner and as supervising physician I was immediately available for consultation/collaboration.  Toy Baker, MD 10/11/11 [phone]

## 2011-10-11 NOTE — ED Notes (Signed)
Pt c/o insomnia. PA informed and stated he would order medciation.

## 2011-10-11 NOTE — ED Notes (Signed)
Specialist on call called awaiting MD call back. Pt in bed sleeping.

## 2011-10-12 ENCOUNTER — Encounter (HOSPITAL_COMMUNITY): Payer: Self-pay | Admitting: Emergency Medicine

## 2011-10-12 ENCOUNTER — Emergency Department (HOSPITAL_COMMUNITY)
Admission: EM | Admit: 2011-10-12 | Discharge: 2011-10-13 | Disposition: A | Discharge status: Home / Self Care | Payer: Medicaid Other | Attending: Emergency Medicine | Admitting: Emergency Medicine

## 2011-10-12 DIAGNOSIS — F29 Unspecified psychosis not due to a substance or known physiological condition: Secondary | ICD-10-CM

## 2011-10-12 DIAGNOSIS — Z79899 Other long term (current) drug therapy: Secondary | ICD-10-CM | POA: Insufficient documentation

## 2011-10-12 DIAGNOSIS — F329 Major depressive disorder, single episode, unspecified: Secondary | ICD-10-CM | POA: Insufficient documentation

## 2011-10-12 DIAGNOSIS — F411 Generalized anxiety disorder: Secondary | ICD-10-CM | POA: Insufficient documentation

## 2011-10-12 DIAGNOSIS — F17200 Nicotine dependence, unspecified, uncomplicated: Secondary | ICD-10-CM | POA: Insufficient documentation

## 2011-10-12 DIAGNOSIS — F259 Schizoaffective disorder, unspecified: Secondary | ICD-10-CM | POA: Insufficient documentation

## 2011-10-12 DIAGNOSIS — F3289 Other specified depressive episodes: Secondary | ICD-10-CM | POA: Insufficient documentation

## 2011-10-12 LAB — ACETAMINOPHEN LEVEL: Acetaminophen (Tylenol), Serum: <15 ug/mL (ref 10–30)

## 2011-10-12 LAB — CBC
MCV: 90.1 fL (ref 78.0–100.0)
Platelets: 240 10*3/uL (ref 150–400)
RBC: 4.75 MIL/uL (ref 4.22–5.81)
RDW: 13.3 % (ref 11.5–15.5)
WBC: 5.3 10*3/uL (ref 4.0–10.5)

## 2011-10-12 LAB — COMPREHENSIVE METABOLIC PANEL
ALT: 14 U/L (ref 0–53)
AST: 16 U/L (ref 0–37)
Albumin: 3.3 g/dL — ABNORMAL LOW (ref 3.5–5.2)
CO2: 28 mEq/L (ref 19–32)
Chloride: 103 mEq/L (ref 96–112)
GFR calc non Af Amer: >90 mL/min (ref >90)
Potassium: 3.7 mEq/L (ref 3.5–5.1)
Sodium: 137 mEq/L (ref 135–145)
Total Bilirubin: 0.4 mg/dL (ref 0.3–1.2)

## 2011-10-12 LAB — RAPID URINE DRUG SCREEN, HOSP PERFORMED
Amphetamines: NOT DETECTED
Benzodiazepines: NOT DETECTED
Tetrahydrocannabinol: NOT DETECTED

## 2011-10-12 MED ORDER — LORAZEPAM 1 MG PO TABS
1.0000 mg | ORAL_TABLET | Freq: Three times a day (TID) | ORAL | Status: DC | PRN
  Administered 2011-10-12: 1 mg via ORAL
  Filled 2011-10-12: qty 1

## 2011-10-12 MED ORDER — OLANZAPINE 5 MG PO TABS
10.0000 mg | ORAL_TABLET | Freq: Every day | ORAL | Status: DC
  Administered 2011-10-12: 10 mg via ORAL
  Filled 2011-10-12: qty 2

## 2011-10-12 MED ORDER — ACETAMINOPHEN 325 MG PO TABS
650.0000 mg | ORAL_TABLET | Freq: Four times a day (QID) | ORAL | Status: DC | PRN
  Administered 2011-10-12: 650 mg via ORAL
  Filled 2011-10-12: qty 2

## 2011-10-12 MED ORDER — ZIPRASIDONE MESYLATE 20 MG IM SOLR: 10.0000 mg | INTRAMUSCULAR | Status: DC | PRN

## 2011-10-12 NOTE — ED Provider Notes (Addendum)
History     CSN: [id number]  Arrival date & time 10/12/11  1733   First MD Initiated Contact with Patient 10/12/11 2023      Chief Complaint  Patient presents with  . Anxiety  . Medical Clearance    denies SI admits to HI     HPI  The patient presents the same day as discharge due to increasingly disturbing thoughts of witches trying to kill him.  He states that evil doers are trying to take his life away.  He believes that the HA he developed one week ago is due to these spirits.  He also thinks that they are responsible for his dyspnea.    Past Medical History  Diagnosis Date  . Schizoaffective disorder   . Depression   . Suicidal thoughts   . Anxiety     History reviewed. No pertinent past surgical history.  No family history on file.  History  Substance Use Topics  . Smoking status: Current Every Day Smoker -- 0.5 packs/day for 12 years    Types: Cigarettes  . Smokeless tobacco: Not on file  . Alcohol Use: Yes     social/rare      Review of Systems  Unable to perform ROS: Psychiatric disorder    Allergies  Review of patient's allergies indicates no known allergies.  Home Medications   Current Outpatient Rx  Name Route Sig Dispense Refill  . DIPHENHYDRAMINE HCL PO Oral Take by mouth.    [name] LITHIUM CARBONATE PO Oral Take by mouth.    [name] PRESCRIPTION MEDICATION  Haldol    . OLANZAPINE 10 MG PO TABS Oral Take 1 tablet (10 mg total) by mouth at bedtime. 30 tablet 0    BP 115/79  Pulse 74  Temp 98.1 F (36.7 C) (Oral)  Resp 18  SpO2 97%  Physical Exam  Nursing note and vitals reviewed. Constitutional: He is oriented to person, place, and time. He appears well-developed. No distress.  HENT:  Head: Normocephalic and atraumatic.  Eyes: Conjunctivae normal and EOM are normal.  Cardiovascular: Normal rate and regular rhythm.   Pulmonary/Chest: Effort normal. No stridor. No respiratory distress.  Abdominal: He exhibits no distension.  Musculoskeletal:  He exhibits no edema.  Neurological: He is alert and oriented to person, place, and time.  Skin: Skin is warm and dry.  Psychiatric: His mood appears anxious. His speech is delayed. He is actively hallucinating. Thought content is paranoid and delusional. Cognition and memory are impaired. He expresses impulsivity. He expresses no homicidal and no suicidal ideation. He expresses no suicidal plans and no homicidal plans.    ED Course  Procedures (including critical care time)  Labs Reviewed  COMPREHENSIVE METABOLIC PANEL - Abnormal; Notable for the following:    Glucose, Bld 123 (*)     Albumin 3.3 (*)     All other components within normal limits  SALICYLATE LEVEL - Abnormal; Notable for the following:    Salicylate Lvl <2.0 (*)     All other components within normal limits  ACETAMINOPHEN LEVEL  CBC  ETHANOL  URINE RAPID DRUG SCREEN (HOSP PERFORMED)   No results found.   No diagnosis found.    MDM  This young M w Hx of psychosis now p/w delusional / persecutory thoughts.  On exam he is in no distress.  Given his psychosis, though he has recently been evaluated, there is concern for his ability to care for himself.  The patient is medically clear for further eval  w BH.   The patient will also require new medication adjustment, as he cannot tell his meds, and it is unclear what is most appropriate for him.   Gerhard Munch, MD 10/12/11 2128  Gerhard Munch, MD 10/12/11 2130

## 2011-10-12 NOTE — ED Notes (Signed)
Pt with diagnosis of schizophrenia returns with anxiety attacks.  He was discharged this morning at 6AM feeling better after getting Ativan but now returns with trembling hands, SOB.  Pt receives his medications from [facility].

## 2011-10-12 NOTE — ED Notes (Signed)
Pt states he is here for headache and heart pain. Pt denies any suicidal or homicidal thoughts. Pt states he feels like his family is out to get him.

## 2011-10-13 NOTE — ED Provider Notes (Signed)
DR PENALVER, PSY, EVALUATED VIA TELEPYSCH AND RECS OK FOR DISCHARGE HOME AND OUTPATIENT COMMUNITY MENTAL HEALTH FOLLOW UP> RESOURCES PROVIDED> PT STABLE FOR D/C HOME  Sunnie Nielsen, MD 10/13/11 [phone]

## 2011-10-24 ENCOUNTER — Encounter (HOSPITAL_COMMUNITY): Payer: Self-pay | Admitting: *Deleted

## 2011-10-24 ENCOUNTER — Emergency Department (HOSPITAL_COMMUNITY)
Admission: EM | Admit: 2011-10-24 | Discharge: 2011-10-25 | Disposition: A | Discharge status: Home / Self Care | Payer: Medicaid Other | Attending: Emergency Medicine | Admitting: Emergency Medicine

## 2011-10-24 DIAGNOSIS — Z79899 Other long term (current) drug therapy: Secondary | ICD-10-CM | POA: Insufficient documentation

## 2011-10-24 DIAGNOSIS — F259 Schizoaffective disorder, unspecified: Secondary | ICD-10-CM

## 2011-10-24 DIAGNOSIS — F17200 Nicotine dependence, unspecified, uncomplicated: Secondary | ICD-10-CM | POA: Insufficient documentation

## 2011-10-24 LAB — CBC
Platelets: 213 10*3/uL (ref 150–400)
RDW: 13.7 % (ref 11.5–15.5)
WBC: 5.9 10*3/uL (ref 4.0–10.5)

## 2011-10-24 NOTE — ED Provider Notes (Signed)
History     CSN: [id number]  Arrival date & time 10/24/11  2209   First MD Initiated Contact with Patient 10/24/11 2314      Chief Complaint  Patient presents with  . Chest Pain  . Medical Clearance    (Consider location/radiation/quality/duration/timing/severity/associated sxs/prior treatment) HPI Comments: Patient states he took 4 Lithium to sleep- he is hearing voices that are keeping him awake. He was recently discharged from [facility] and is followed by home act team who again changed his medications.  He deinies SI/HI but feels he needs a psy evaluation    Patient states he developed a URI today   Patient is a 36 y.o. male presenting with chest pain. The history is provided by the patient.  Chest Pain The chest pain began 1 - 2 hours ago. At its most intense, the pain is at 0/10. Pertinent negatives for primary symptoms include no shortness of breath and no cough.     Past Medical History  Diagnosis Date  . Schizoaffective disorder   . Depression   . Suicidal thoughts   . Anxiety     History reviewed. No pertinent past surgical history.  History reviewed. No pertinent family history.  History  Substance Use Topics  . Smoking status: Current Every Day Smoker -- 0.5 packs/day for 12 years    Types: Cigarettes  . Smokeless tobacco: Not on file  . Alcohol Use: Yes     social/rare      Review of Systems  HENT: Positive for congestion.   Respiratory: Positive for chest tightness. Negative for cough and shortness of breath.   Cardiovascular: Negative for chest pain.    Allergies  Review of patient's allergies indicates no known allergies.  Home Medications   Current Outpatient Rx  Name Route Sig Dispense Refill  . DIPHENHYDRAMINE HCL 25 MG PO TABS Oral Take 25 mg by mouth every 6 (six) hours as needed. sleep    . HALOPERIDOL DECANOATE 100 MG/ML IM SOLN Intramuscular Inject 150 mg into the muscle every 28 (twenty-eight) days.    [name] LITHIUM CARBONATE  300 MG PO TABS Oral Take 300-600 mg by mouth 2 (two) times daily. 300mg  in the am and 600mg  at bedtime      BP 106/68  Pulse 84  Temp 97.8 F (36.6 C) (Oral)  Resp 22  SpO2 97%  Physical Exam  Constitutional: He appears well-developed and well-nourished.  HENT:  Head: Normocephalic.  Eyes: Pupils are equal, round, and reactive to light.  Neck: Normal range of motion.  Cardiovascular: Normal rate and regular rhythm.   Pulmonary/Chest: Effort normal and breath sounds normal. No respiratory distress.  Abdominal: He exhibits no distension. There is no tenderness.  Musculoskeletal: Normal range of motion.  Neurological: He is alert.  Skin: Skin is warm.  Psychiatric: His speech is normal. Thought content is paranoid.    ED Course  Procedures (including critical care time)  Labs Reviewed  SALICYLATE LEVEL - Abnormal; Notable for the following:    Salicylate Lvl <2.0 (*)     All other components within normal limits  CBC  ETHANOL  URINE RAPID DRUG SCREEN (HOSP PERFORMED)  ACETAMINOPHEN LEVEL  LITHIUM LEVEL  BASIC METABOLIC PANEL   No results found.   1. Schizoaffective disorder       MDM          Arman Filter, NP 10/26/11 [phone]

## 2011-10-24 NOTE — ED Notes (Addendum)
Per EMS pt from home, took 4 of lithium instead of one "to go to sleep".En route pt c/o chest wall pain, per pt felt like pressure, resolved after O2 administered via n/c.

## 2011-10-24 NOTE — ED Notes (Addendum)
Pt received in rm 8, pt sts he took 4 of lithium to help him slep, pt sts "I'm tired I need some rest" Pt also sts he need to be in the Psych ED because "I'm hearing that voice again and he is telling me that they are coming to get me. They are doing that magic and witchcraft and they are coming after me." Pt requesting "blue pants" and is lying in bed comfortably.  Pt denies chest pain. Consulting civil engineer notified.

## 2011-10-24 NOTE — ED Notes (Signed)
ZOX:[id number]<AV> Expected date:10/24/11<BR> Expected time: 9:52 PM<BR> Means of arrival:Ambulance<BR> Comments:<BR> Chest wall pain; excess lithium intake

## 2011-10-25 LAB — BASIC METABOLIC PANEL
CO2: 22 mEq/L (ref 19–32)
Calcium: 9.6 mg/dL (ref 8.4–10.5)
Chloride: 100 mEq/L (ref 96–112)
Sodium: 138 mEq/L (ref 135–145)

## 2011-10-25 LAB — LITHIUM LEVEL: Lithium Lvl: 1.28 mEq/L (ref 0.80–1.40)

## 2011-10-25 LAB — RAPID URINE DRUG SCREEN, HOSP PERFORMED
Barbiturates: NOT DETECTED
Cocaine: NOT DETECTED
Opiates: NOT DETECTED

## 2011-10-25 LAB — ETHANOL: Alcohol, Ethyl (B): <11 mg/dL (ref 0–11)

## 2011-10-25 MED ORDER — IBUPROFEN 200 MG PO TABS: 600.0000 mg | ORAL_TABLET | Freq: Three times a day (TID) | ORAL | Status: DC | PRN

## 2011-10-25 MED ORDER — ONDANSETRON HCL 4 MG PO TABS: 4.0000 mg | ORAL_TABLET | Freq: Three times a day (TID) | ORAL | Status: DC | PRN

## 2011-10-25 MED ORDER — NICOTINE 21 MG/24HR TD PT24: 21.0000 mg | MEDICATED_PATCH | Freq: Every day | TRANSDERMAL | Status: DC

## 2011-10-25 MED ORDER — ACETAMINOPHEN 325 MG PO TABS: 650.0000 mg | ORAL_TABLET | ORAL | Status: DC | PRN

## 2011-10-25 MED ORDER — LORAZEPAM 1 MG PO TABS: 1.0000 mg | ORAL_TABLET | Freq: Three times a day (TID) | ORAL | Status: DC | PRN

## 2011-10-25 MED ORDER — ZOLPIDEM TARTRATE 5 MG PO TABS: 5.0000 mg | ORAL_TABLET | Freq: Every evening | ORAL | Status: DC | PRN

## 2011-10-25 NOTE — ED Provider Notes (Signed)
He was seen by the tele-psychiatrist, who advised discharge with community mental health followup  Discharge diagnosis #1 Schizoaffective Disorder. #2 noncompliance  Flint Melter, MD 10/25/11 [phone]

## 2011-10-25 NOTE — ED Notes (Signed)
Patient is resting comfortably. 

## 2011-10-25 NOTE — ED Notes (Signed)
Pt awaiting act team

## 2011-10-25 NOTE — ED Notes (Signed)
telepsych done, advised to be d/ced

## 2011-10-25 NOTE — ED Provider Notes (Signed)
No new c/o  November Sypher, MD 10/25/11 0852 

## 2011-10-25 NOTE — ED Notes (Signed)
Pt resting quietly in room, no rooms left in pysch ED so patient will remain on acute side

## 2011-10-25 NOTE — ED Notes (Signed)
Vonna Kotyk called from the lab to inform that patient's blood is lipemic and ALL Lab work was going to be sent to [facility] lab as soon as courier was called and specimen picked up. This may take  Up to an hour.

## 2011-10-25 NOTE — ED Notes (Signed)
Act team initiated telepsych for patient

## 2011-10-25 NOTE — ED Notes (Signed)
Pt resting, no complaints, VS stable 83,18,99% RA

## 2011-10-26 NOTE — ED Provider Notes (Signed)
Medical screening examination/treatment/procedure(s) were performed by non-physician practitioner and as supervising physician I was immediately available for consultation/collaboration.  Davene Jobin, MD 10/26/11 0119 

## 2011-10-27 ENCOUNTER — Telehealth (HOSPITAL_COMMUNITY): Payer: Self-pay | Admitting: Licensed Clinical Social Worker

## 2012-01-15 ENCOUNTER — Emergency Department (HOSPITAL_COMMUNITY)
Admission: EM | Admit: 2012-01-15 | Discharge: 2012-01-15 | Disposition: A | Discharge status: Home / Self Care | Payer: Medicaid Other | Attending: Emergency Medicine | Admitting: Emergency Medicine

## 2012-01-15 ENCOUNTER — Encounter (HOSPITAL_COMMUNITY): Payer: Self-pay | Admitting: Emergency Medicine

## 2012-01-15 DIAGNOSIS — F259 Schizoaffective disorder, unspecified: Secondary | ICD-10-CM | POA: Insufficient documentation

## 2012-01-15 DIAGNOSIS — G4700 Insomnia, unspecified: Secondary | ICD-10-CM | POA: Insufficient documentation

## 2012-01-15 DIAGNOSIS — F17200 Nicotine dependence, unspecified, uncomplicated: Secondary | ICD-10-CM | POA: Insufficient documentation

## 2012-01-15 DIAGNOSIS — Z79899 Other long term (current) drug therapy: Secondary | ICD-10-CM | POA: Insufficient documentation

## 2012-01-15 DIAGNOSIS — Z8659 Personal history of other mental and behavioral disorders: Secondary | ICD-10-CM | POA: Insufficient documentation

## 2012-01-15 MED ORDER — MELATONIN 1 MG PO TABS: 1.0000 mg | ORAL_TABLET | Freq: Once | ORAL | Status: DC

## 2012-01-15 MED ORDER — DIPHENHYDRAMINE HCL 25 MG PO TABS: 25.0000 mg | ORAL_TABLET | Freq: Four times a day (QID) | ORAL | Status: DC | PRN

## 2012-01-15 NOTE — ED Notes (Signed)
Pt. Stated, I've not been able to sleep for 4 months

## 2012-01-15 NOTE — ED Provider Notes (Signed)
History     CSN: [id number]  Arrival date & time 01/15/12  1003   First MD Initiated Contact with Patient 01/15/12 1125      Chief Complaint  Patient presents with  . Insomnia    (Consider location/radiation/quality/duration/timing/severity/associated sxs/prior treatment) HPI Comments: This is a 37 year old male, who presents emergency department with past medical history of schizoaffective disorder, depression, and anxiety, who presents with chief complaint of insomnia. Patient states that he has not slept well for the past 4 months. He reports having difficulty falling asleep and staying asleep. He has not tried anything to help him sleep. The patient is not in any pain or in any apparent distress. There are no aggravating or alleviating factors.  The history is provided by the patient. No language interpreter was used.    Past Medical History  Diagnosis Date  . Schizoaffective disorder   . Depression   . Suicidal thoughts   . Anxiety     History reviewed. No pertinent past surgical history.  No family history on file.  History  Substance Use Topics  . Smoking status: Current Every Day Smoker -- 0.5 packs/day for 12 years    Types: Cigarettes  . Smokeless tobacco: Not on file  . Alcohol Use: Yes     Comment: social/rare      Review of Systems  All other systems reviewed and are negative.    Allergies  Review of patient's allergies indicates no known allergies.  Home Medications   Current Outpatient Rx  Name  Route  Sig  Dispense  Refill  . DIPHENHYDRAMINE HCL 25 MG PO TABS   Oral   Take 1 tablet (25 mg total) by mouth every 6 (six) hours as needed for itching (Rash).   30 tablet   0   . MELATONIN 1 MG PO TABS   Oral   Take 1 tablet (1 mg total) by mouth once.   10 tablet   0     BP 105/70  Pulse 79  Temp 97.9 F (36.6 C) (Oral)  Resp 16  SpO2 97%  Physical Exam  Nursing note and vitals reviewed. Constitutional: He is oriented to person,  place, and time. He appears well-developed and well-nourished.  HENT:  Head: Normocephalic and atraumatic.  Nose: Nose normal.  Mouth/Throat: Oropharynx is clear and moist. No oropharyngeal exudate.  Eyes: Conjunctivae normal and EOM are normal. Right eye exhibits no discharge. Left eye exhibits no discharge. No scleral icterus.  Neck: Normal range of motion. Neck supple. No JVD present.  Cardiovascular: Normal rate, regular rhythm, normal heart sounds and intact distal pulses.  Exam reveals no gallop and no friction rub.   No murmur heard. Pulmonary/Chest: Effort normal and breath sounds normal. No respiratory distress. He has no wheezes. He has no rales. He exhibits no tenderness.  Abdominal: Soft. Bowel sounds are normal. He exhibits no distension and no mass. There is no tenderness. There is no rebound and no guarding.  Musculoskeletal: Normal range of motion. He exhibits no edema and no tenderness.  Neurological: He is alert and oriented to person, place, and time.  Skin: Skin is warm and dry.  Psychiatric: He has a normal mood and affect. His behavior is normal. Judgment and thought content normal.    ED Course  Procedures (including critical care time)  Labs Reviewed - No data to display No results found.   1. Insomnia       MDM  37 year old male with insomnia. Discussed at length,  the knee for the patient followup with his primary care provider for sleep studies. The patient understands and agrees with the plan. I told him that he could try using Benadryl or melatonin to help him sleep. The patient is stable and ready for discharge.        Roxy Horseman, PA-C 01/15/12 1436

## 2012-01-15 NOTE — ED Notes (Signed)
Patient has no other complaints other than insomnia.  Patient reports not having a full night of sleep in 3-4 months.

## 2012-01-15 NOTE — ED Provider Notes (Signed)
Medical screening examination/treatment/procedure(s) were performed by non-physician practitioner and as supervising physician I was immediately available for consultation/collaboration.   Skylee Baird, MD 01/15/12 1646 

## 2012-01-16 ENCOUNTER — Encounter (HOSPITAL_COMMUNITY): Payer: Self-pay | Admitting: *Deleted

## 2012-01-16 DIAGNOSIS — R55 Syncope and collapse: Secondary | ICD-10-CM | POA: Insufficient documentation

## 2012-01-16 DIAGNOSIS — F259 Schizoaffective disorder, unspecified: Secondary | ICD-10-CM | POA: Insufficient documentation

## 2012-01-16 DIAGNOSIS — F17200 Nicotine dependence, unspecified, uncomplicated: Secondary | ICD-10-CM | POA: Insufficient documentation

## 2012-01-16 DIAGNOSIS — F329 Major depressive disorder, single episode, unspecified: Secondary | ICD-10-CM | POA: Insufficient documentation

## 2012-01-16 DIAGNOSIS — F411 Generalized anxiety disorder: Secondary | ICD-10-CM | POA: Insufficient documentation

## 2012-01-16 DIAGNOSIS — F3289 Other specified depressive episodes: Secondary | ICD-10-CM | POA: Insufficient documentation

## 2012-01-16 DIAGNOSIS — Z79899 Other long term (current) drug therapy: Secondary | ICD-10-CM | POA: Insufficient documentation

## 2012-01-16 DIAGNOSIS — R079 Chest pain, unspecified: Secondary | ICD-10-CM | POA: Insufficient documentation

## 2012-01-16 NOTE — ED Notes (Signed)
Pt states "I need to talk to someone, I've been having dizzy spells and passed out.  I was riding the city bus and my breathing stopped".

## 2012-01-16 NOTE — ED Notes (Signed)
Pt states, "my heart stops, I've been feeling like this for one month".  Mumbling to self in triage.

## 2012-01-16 NOTE — ED Notes (Signed)
Pt refused blood work  

## 2012-01-17 ENCOUNTER — Encounter (HOSPITAL_COMMUNITY): Payer: Self-pay | Admitting: *Deleted

## 2012-01-17 ENCOUNTER — Emergency Department (HOSPITAL_COMMUNITY)
Admission: EM | Admit: 2012-01-17 | Discharge: 2012-01-17 | Disposition: A | Discharge status: Home / Self Care | Payer: Medicaid Other | Attending: Emergency Medicine | Admitting: Emergency Medicine

## 2012-01-17 DIAGNOSIS — F419 Anxiety disorder, unspecified: Secondary | ICD-10-CM

## 2012-01-17 NOTE — ED Notes (Addendum)
Pt states "I think I am having a stressed induced heart attack." pt is refusing to have his blood drawn. Pt encouraged to have blood drawn for test, pt reports "my blood is low and I don't want anybody to take it." pt reports he lives in a condemned house and everybody there smokes crack. Pt believes this is what is causing his "stress induced heart attack." pt denies SI/HI. pt is A&Ox4, skin is warm and dry, respirations are equal and unlabored. Dr. Verl Bangs notified

## 2012-01-17 NOTE — ED Provider Notes (Addendum)
History     CSN: [id number]  Arrival date & time 01/16/12  2252   First MD Initiated Contact with Patient 01/17/12 0257      Chief Complaint  Patient presents with  . Anxiety  . Loss of Consciousness    (Consider location/radiation/quality/duration/timing/severity/associated sxs/prior treatment) Patient is a 37 y.o. male presenting with anxiety and syncope. The history is provided by the patient.  Anxiety This is a new problem. The current episode started 1 to 2 hours ago. The problem occurs constantly. The problem has been resolved. Associated symptoms include chest pain. Nothing aggravates the symptoms. Nothing relieves the symptoms.  Loss of Consciousness Associated symptoms include chest pain.    Past Medical History  Diagnosis Date  . Schizoaffective disorder   . Depression   . Suicidal thoughts   . Anxiety     History reviewed. No pertinent past surgical history.  History reviewed. No pertinent family history.  History  Substance Use Topics  . Smoking status: Current Every Day Smoker -- 0.5 packs/day for 12 years    Types: Cigarettes  . Smokeless tobacco: Not on file  . Alcohol Use: Yes     Comment: social/rare      Review of Systems  Cardiovascular: Positive for chest pain and syncope.  Neurological: Positive for syncope.  All other systems reviewed and are negative.    Allergies  Review of patient's allergies indicates no known allergies.  Home Medications   Current Outpatient Rx  Name  Route  Sig  Dispense  Refill  . DIPHENHYDRAMINE HCL 25 MG PO TABS   Oral   Take 1 tablet (25 mg total) by mouth every 6 (six) hours as needed for itching (Rash).   30 tablet   0   . MELATONIN 1 MG PO TABS   Oral   Take 1 tablet (1 mg total) by mouth once.   10 tablet   0     BP 118/68  Pulse 93  Temp 98.8 F (37.1 C) (Oral)  Resp 16  SpO2 95%  Physical Exam  Constitutional: He is oriented to person, place, and time. He appears well-developed and  well-nourished.  HENT:  Head: Normocephalic and atraumatic.  Eyes: Conjunctivae normal are normal. Pupils are equal, round, and reactive to light.  Neck: Normal range of motion. Neck supple.  Cardiovascular: Normal rate, regular rhythm, normal heart sounds and intact distal pulses.   Pulmonary/Chest: Effort normal and breath sounds normal.  Abdominal: Soft. Bowel sounds are normal.  Neurological: He is alert and oriented to person, place, and time.  Skin: Skin is warm and dry.  Psychiatric: He has a normal mood and affect. His behavior is normal. Judgment and thought content normal.    ED Course  Procedures (including critical care time)   Labs Reviewed  CBC  BASIC METABOLIC PANEL   No results found.   No diagnosis found.   Date: 01/17/2012  Rate: 94  Rhythm: normal sinus rhythm  QRS Axis: normal  Intervals: normal  ST/T Wave abnormalities: normal  Conduction Disutrbances: none  Narrative Interpretation: unremarkable     MDM  + cp,  Syncope.  No pe risk factors.  Hx of anxiety.  Pt states this feels the same as anxiety.  Will lab, ekg,  reassess  Pt declines further treatment.  No criteria for commitment.  Will ama       [place] Pelto Lytle Michaels, MD 01/17/12 1478  Rosanne Ashing, MD 01/17/12 [phone]

## 2012-01-27 ENCOUNTER — Emergency Department (HOSPITAL_COMMUNITY)
Admission: EM | Admit: 2012-01-27 | Discharge: 2012-01-28 | Disposition: A | Discharge status: Home / Self Care | Payer: Medicaid Other | Attending: Emergency Medicine | Admitting: Emergency Medicine

## 2012-01-27 ENCOUNTER — Encounter (HOSPITAL_COMMUNITY): Payer: Self-pay | Admitting: Emergency Medicine

## 2012-01-27 DIAGNOSIS — F329 Major depressive disorder, single episode, unspecified: Secondary | ICD-10-CM

## 2012-01-27 DIAGNOSIS — F259 Schizoaffective disorder, unspecified: Secondary | ICD-10-CM | POA: Insufficient documentation

## 2012-01-27 DIAGNOSIS — Z008 Encounter for other general examination: Secondary | ICD-10-CM | POA: Insufficient documentation

## 2012-01-27 DIAGNOSIS — R45850 Homicidal ideations: Secondary | ICD-10-CM | POA: Insufficient documentation

## 2012-01-27 DIAGNOSIS — F411 Generalized anxiety disorder: Secondary | ICD-10-CM | POA: Insufficient documentation

## 2012-01-27 LAB — COMPREHENSIVE METABOLIC PANEL
ALT: 17 U/L (ref 0–53)
AST: 23 U/L (ref 0–37)
Albumin: 3.7 g/dL (ref 3.5–5.2)
CO2: 31 mEq/L (ref 19–32)
Calcium: 9.7 mg/dL (ref 8.4–10.5)
Creatinine, Ser: 0.77 mg/dL (ref 0.50–1.35)
GFR calc non Af Amer: >90 mL/min (ref >90)
Sodium: 141 mEq/L (ref 135–145)
Total Protein: 7.2 g/dL (ref 6.0–8.3)

## 2012-01-27 LAB — SALICYLATE LEVEL: Salicylate Lvl: <2 mg/dL — ABNORMAL LOW (ref 2.8–20.0)

## 2012-01-27 LAB — RAPID URINE DRUG SCREEN, HOSP PERFORMED
Amphetamines: NOT DETECTED
Benzodiazepines: NOT DETECTED
Cocaine: NOT DETECTED
Opiates: NOT DETECTED

## 2012-01-27 LAB — CBC
MCH: 30.4 pg (ref 26.0–34.0)
MCV: 87.3 fL (ref 78.0–100.0)
Platelets: 194 10*3/uL (ref 150–400)
RBC: 5.36 MIL/uL (ref 4.22–5.81)
RDW: 14.2 % (ref 11.5–15.5)

## 2012-01-27 LAB — ACETAMINOPHEN LEVEL: Acetaminophen (Tylenol), Serum: <15 ug/mL (ref 10–30)

## 2012-01-27 MED ORDER — ALUM & MAG HYDROXIDE-SIMETH 200-200-20 MG/5ML PO SUSP: 30.0000 mL | ORAL | Status: DC | PRN

## 2012-01-27 MED ORDER — ACETAMINOPHEN 325 MG PO TABS: 650.0000 mg | ORAL_TABLET | ORAL | Status: DC | PRN

## 2012-01-27 MED ORDER — NICOTINE 21 MG/24HR TD PT24: 21.0000 mg | MEDICATED_PATCH | Freq: Every day | TRANSDERMAL | Status: DC

## 2012-01-27 MED ORDER — LORAZEPAM 1 MG PO TABS: 1.0000 mg | ORAL_TABLET | Freq: Three times a day (TID) | ORAL | Status: DC | PRN

## 2012-01-27 MED ORDER — ONDANSETRON HCL 4 MG PO TABS: 4.0000 mg | ORAL_TABLET | Freq: Three times a day (TID) | ORAL | Status: DC | PRN

## 2012-01-27 NOTE — ED Provider Notes (Signed)
History     CSN: [id number]  Arrival date & time 01/27/12  2107   First MD Initiated Contact with Patient 01/27/12 2152      Chief Complaint  Patient presents with  . Depression    (Consider location/radiation/quality/duration/timing/severity/associated sxs/prior treatment) HPI History provided by pt.   Pt reports that he has been severely depressed over the past 4 days.  Has been under a lot of stress.  Has h/o depression but was taken off of his medications because they were not benefiting him.  Denies SI but has experienced HI.  He wants to kill the people that are out to get him.  Per prior chart, pt has been seen in the ED w/ psychiatric complaints several times.  Other than depression, psych history also includes anxiety and schizoaffective disorder.  Drinks alcohol socially and denies drug abuse.  Past Medical History  Diagnosis Date  . Schizoaffective disorder   . Depression   . Suicidal thoughts   . Anxiety     History reviewed. No pertinent past surgical history.  No family history on file.  History  Substance Use Topics  . Smoking status: Current Every Day Smoker -- 0.5 packs/day for 12 years    Types: Cigarettes  . Smokeless tobacco: Not on file  . Alcohol Use: Yes     Comment: social/rare      Review of Systems  All other systems reviewed and are negative.    Allergies  Review of patient's allergies indicates no known allergies.  Home Medications  No current outpatient prescriptions on file.  BP 111/80  Pulse 94  Temp 98.3 F (36.8 C) (Oral)  Resp 20  Ht 6\' 2"  (1.88 m)  Wt 218 lb (98.884 kg)  BMI 27.99 kg/m2  SpO2 99%  Physical Exam  Nursing note and vitals reviewed. Constitutional: He is oriented to person, place, and time. He appears well-developed and well-nourished. No distress.       Pt had a blanket over his head throughout my encounter with him despite my request for him to remove it.   HENT:  Head: Normocephalic and atraumatic.   Eyes:       Normal appearance  Neck: Normal range of motion.  Pulmonary/Chest: Effort normal.  Musculoskeletal: Normal range of motion.  Neurological: He is alert and oriented to person, place, and time.  Psychiatric:       Pt seemed to have a normal mood.     ED Course  Procedures (including critical care time)  Labs Reviewed  COMPREHENSIVE METABOLIC PANEL - Abnormal; Notable for the following:    Glucose, Bld 103 (*)     All other components within normal limits  SALICYLATE LEVEL - Abnormal; Notable for the following:    Salicylate Lvl <2.0 (*)     All other components within normal limits  CBC  ETHANOL  ACETAMINOPHEN LEVEL  URINE RAPID DRUG SCREEN (HOSP PERFORMED)   No results found.   1. Depression   2. Homicidal ideation       MDM  37yo M w/ h/o schizoaffective disorder, anxiety and depression, presents w/ c/o depression and  HI.  Medically cleared.  Holding orders written.         Otilio Miu, PA-C 01/28/12 [phone]

## 2012-01-27 NOTE — ED Notes (Signed)
Pt up to bathroom and asked for soda.

## 2012-01-27 NOTE — ED Notes (Signed)
Per EMS pt picked up from street, pt c/o dizziness, nausea and depression x 1 week. A & O.

## 2012-01-27 NOTE — ED Notes (Signed)
Pt states his depression is back and would like to be admitted. Pt states he has not been taking meds because a they do not work. Pt currently rambling quietly. Pt states people are after him and his children. Pt states he is homicidal, he wants to kill the people who are after him. Pt denies SI.

## 2012-01-28 DIAGNOSIS — R45850 Homicidal ideations: Secondary | ICD-10-CM

## 2012-01-28 NOTE — ED Notes (Signed)
[facility]T CSW asked that telepsych be completed on pt.

## 2012-01-28 NOTE — ED Notes (Addendum)
Dr Miller into see 

## 2012-01-28 NOTE — ED Provider Notes (Signed)
Medical screening examination/treatment/procedure(s) were performed by non-physician practitioner and as supervising physician I was immediately available for consultation/collaboration.  Vinayak Bobier, MD 01/28/12 1535 

## 2012-01-28 NOTE — ED Notes (Signed)
Pt ambulatory to dc window w/o difficulty.  Belongings returned after dc, bus pass given

## 2012-01-28 NOTE — ED Provider Notes (Signed)
  Physical Exam  BP 109/67  Pulse 74  Temp 98.3 F (36.8 C) (Oral)  Resp 16  Ht 6\' 2"  (1.88 m)  Wt 218 lb (98.884 kg)  BMI 27.99 kg/m2  SpO2 97%  Physical Exam  ED Course  Procedures  MDM Patient has been seen and examined by a [facility], at this time he is not having any active hallucinations, he does not feel suicidal, he is not depressed. He is amenable to discharge and according to the [facility] psych physician he is stable for this. In my discussions with the patient he states that he will go home this morning he feels okay to do that, is tolerating oral food and fluids, appear stable for discharge  Physical exam, slightly flat affect, soft abdomen, clear heart and lung sounds, stable movements without tremor, stable for discharge. Laboratory normal, vital signs are normal.      Vida Roller, MD 01/28/12 [phone]

## 2012-01-28 NOTE — ED Notes (Signed)
Stated understanding of D/C instructions. Escorted by staff out. Ambulatory, no complaints voiced.

## 2012-01-29 ENCOUNTER — Emergency Department (HOSPITAL_COMMUNITY)
Admission: EM | Admit: 2012-01-29 | Discharge: 2012-01-29 | Disposition: A | Discharge status: Home / Self Care | Payer: Medicaid Other | Attending: Emergency Medicine | Admitting: Emergency Medicine

## 2012-01-29 ENCOUNTER — Encounter (HOSPITAL_COMMUNITY): Payer: Self-pay | Admitting: Emergency Medicine

## 2012-01-29 DIAGNOSIS — R109 Unspecified abdominal pain: Secondary | ICD-10-CM

## 2012-01-29 DIAGNOSIS — R45851 Suicidal ideations: Secondary | ICD-10-CM | POA: Insufficient documentation

## 2012-01-29 DIAGNOSIS — Z8719 Personal history of other diseases of the digestive system: Secondary | ICD-10-CM | POA: Insufficient documentation

## 2012-01-29 DIAGNOSIS — Z8659 Personal history of other mental and behavioral disorders: Secondary | ICD-10-CM | POA: Insufficient documentation

## 2012-01-29 DIAGNOSIS — R1084 Generalized abdominal pain: Secondary | ICD-10-CM | POA: Insufficient documentation

## 2012-01-29 DIAGNOSIS — F17200 Nicotine dependence, unspecified, uncomplicated: Secondary | ICD-10-CM | POA: Insufficient documentation

## 2012-01-29 LAB — URINALYSIS, ROUTINE W REFLEX MICROSCOPIC
Glucose, UA: NEGATIVE mg/dL
Hgb urine dipstick: NEGATIVE
Ketones, ur: NEGATIVE mg/dL
Leukocytes, UA: NEGATIVE
Protein, ur: NEGATIVE mg/dL
pH: 5.5 (ref 5.0–8.0)

## 2012-01-29 NOTE — ED Notes (Signed)
Pt states he has a work related ulcer, but states he hasn't worked in a while.

## 2012-01-29 NOTE — ED Notes (Addendum)
Pt sitting in bed quietly in NAD.  Pt asking for something to drink.  I explained to pt that he would need to wait for evaluation from MD before getting anything.  Pt verbalized understanding.

## 2012-01-29 NOTE — ED Notes (Signed)
Pt. Stated, I've got a ulcer or something going on with my stomach , I think its a work ulcer form stress at work.

## 2012-01-29 NOTE — ED Notes (Signed)
Pt noted to have strong smell of body odor. Pt refusing blood work stating will give urine sample. Pt asking for coke, told only ice chips at this time. Pt wearing blue paper scrubs and red socks upon arrival.

## 2012-01-29 NOTE — ED Provider Notes (Signed)
History     CSN: [id number]  Arrival date & time 01/29/12  1356   First MD Initiated Contact with Patient 01/29/12 1407      Chief Complaint  Patient presents with  . Abdominal Pain    (Consider location/radiation/quality/duration/timing/severity/associated sxs/prior treatment) HPI Comments: Patient with a history of Anxiety, Depression, and Schizoaffective Disorder presents today with a chief complaint of abdominal pain.  He reports that the pain has been constant for the past 2 weeks and is unchanged from onset.  Pain is generalized abdominal pain and does not radiate.  He has not takne anything for his symptoms.  He denies nausea, vomiting, or diarrhea. Denies fever or chills. Denies urinary symptoms.  Patient was seen in the ED 2 days ago for psychiatric issues.  He was discharged yesterday after Telepsych consult.  According to the ED note he did not mention any abdominal pain at that time.  He does drink alcohol.  He reports that his last drink was yesterday.  He denies any prior history of Pancreatitis.  No prior history of abdominal surgeries.  Patient reports that he has been under increased stress recently, which he feels may be contributing to the pain.    Patient is a 38 y.o. male presenting with abdominal pain. The history is provided by the patient.  Abdominal Pain The primary symptoms of the illness include abdominal pain. The primary symptoms of the illness do not include fever, nausea, vomiting, diarrhea, hematochezia or dysuria.  The patient has not had a change in bowel habit. Symptoms associated with the illness do not include chills, constipation, urgency, hematuria or frequency.    Past Medical History  Diagnosis Date  . Schizoaffective disorder   . Depression   . Suicidal thoughts   . Anxiety     History reviewed. No pertinent past surgical history.  No family history on file.  History  Substance Use Topics  . Smoking status: Current Every Day Smoker -- 0.5  packs/day for 12 years    Types: Cigarettes  . Smokeless tobacco: Not on file  . Alcohol Use: Yes     Comment: social/rare      Review of Systems  Constitutional: Negative for fever and chills.  Gastrointestinal: Positive for abdominal pain. Negative for nausea, vomiting, diarrhea, constipation, hematochezia and abdominal distention.  Genitourinary: Negative for dysuria, urgency, frequency, hematuria, discharge, penile swelling, scrotal swelling and testicular pain.  All other systems reviewed and are negative.    Allergies  Review of patient's allergies indicates no known allergies.  Home Medications  No current outpatient prescriptions on file.  BP 108/66  Pulse 94  Temp 98.2 F (36.8 C) (Oral)  Resp 16  SpO2 9%  Physical Exam  Nursing note and vitals reviewed. Constitutional: He appears well-developed and well-nourished. No distress.  HENT:  Head: Normocephalic and atraumatic.  Mouth/Throat: Oropharynx is clear and moist.  Neck: Normal range of motion. Neck supple.  Cardiovascular: Normal rate, regular rhythm and normal heart sounds.   Pulmonary/Chest: Effort normal and breath sounds normal.  Abdominal: Soft. Normal appearance and bowel sounds are normal. He exhibits no distension and no mass. There is no rigidity, no rebound, no guarding and no CVA tenderness.       Mild generalized abdominal pain  Neurological: He is alert.  Skin: Skin is warm and dry. He is not diaphoretic.  Psychiatric: He has a normal mood and affect.    ED Course  Procedures (including critical care time)   Labs Reviewed  URINALYSIS, ROUTINE W REFLEX MICROSCOPIC   No results found.   No diagnosis found.  Patient reports that he does not want any labs drawn.  He only wants his urine checked.  MDM  Patient with a psychiatric history presents today with a chief complaint of generalized abdominal pain. On abdominal exam pain generalized with no rebound or guarding.   Patient afebrile.   No nausea, vomiting, or diarrhea.  Patient just discharged from the hospital yesterday after being evaluated for psychiatric reasons.  He refuses to have any blood work at this time, but reports that he would like his urine checked.  UA negative.  Patient discharged home.  Return precautions given.          Pascal Lux [place], PA-C 01/29/12 2226

## 2012-01-30 NOTE — ED Provider Notes (Signed)
Medical screening examination/treatment/procedure(s) were performed by non-physician practitioner and as supervising physician I was immediately available for consultation/collaboration.   David H Yao, MD 01/30/12 0655 

## 2012-02-02 ENCOUNTER — Encounter (HOSPITAL_COMMUNITY): Payer: Self-pay | Admitting: *Deleted

## 2012-02-02 ENCOUNTER — Emergency Department (HOSPITAL_COMMUNITY)
Admission: EM | Admit: 2012-02-02 | Discharge: 2012-02-02 | Disposition: A | Discharge status: Home / Self Care | Payer: Medicaid Other | Attending: Emergency Medicine | Admitting: Emergency Medicine

## 2012-02-02 ENCOUNTER — Emergency Department (HOSPITAL_COMMUNITY): Payer: Medicaid Other

## 2012-02-02 DIAGNOSIS — J3489 Other specified disorders of nose and nasal sinuses: Secondary | ICD-10-CM | POA: Insufficient documentation

## 2012-02-02 DIAGNOSIS — R509 Fever, unspecified: Secondary | ICD-10-CM | POA: Insufficient documentation

## 2012-02-02 DIAGNOSIS — F419 Anxiety disorder, unspecified: Secondary | ICD-10-CM

## 2012-02-02 DIAGNOSIS — J069 Acute upper respiratory infection, unspecified: Secondary | ICD-10-CM | POA: Insufficient documentation

## 2012-02-02 DIAGNOSIS — Z8659 Personal history of other mental and behavioral disorders: Secondary | ICD-10-CM | POA: Insufficient documentation

## 2012-02-02 DIAGNOSIS — R0602 Shortness of breath: Secondary | ICD-10-CM | POA: Insufficient documentation

## 2012-02-02 DIAGNOSIS — R059 Cough, unspecified: Secondary | ICD-10-CM | POA: Insufficient documentation

## 2012-02-02 DIAGNOSIS — R197 Diarrhea, unspecified: Secondary | ICD-10-CM | POA: Insufficient documentation

## 2012-02-02 DIAGNOSIS — F17200 Nicotine dependence, unspecified, uncomplicated: Secondary | ICD-10-CM | POA: Insufficient documentation

## 2012-02-02 DIAGNOSIS — R05 Cough: Secondary | ICD-10-CM | POA: Insufficient documentation

## 2012-02-02 MED ORDER — LORAZEPAM 1 MG PO TABS
1.0000 mg | ORAL_TABLET | Freq: Once | ORAL | Status: AC
  Administered 2012-02-02: 1 mg via ORAL
  Filled 2012-02-02: qty 1

## 2012-02-02 NOTE — ED Provider Notes (Signed)
This chart was scribed for Erik Octave, MD by Shari Heritage, ED Scribe. The patient was seen in room D31C/D31C. Patient was evaluated at 54.   Erik [name] is a 37 y.o. male with history of depression, Schizoaffective disorder, anxiety and suicidal thoughts. He presents here stating that he is having a panic attack. Patient also complains of a persistent shortness of breath and cough onset 1 week ago. There is associated subjective fever and diarrhea. Patient denies SI or HI. He denies auditory or visual hallucinations. Patient has not been taking his medications as instructed, states that he can't afford them. Patient has been seen here multiple times this month for behavior health related problems.   7:21 PM- Patient is refusing to have blood drawn for completion of labs stating that it will "make him weak."   History of schizophrenia presenting with cough, shortness of breath for the past 3 days. Denies SI, HI or hallucinations. However he is talking to himself and seems to be responding to internal stimuli.  Will obtain telepsych.  I personally performed the services described in this documentation, which was scribed in my presence. The recorded information has been reviewed and is accurate.   Erik Octave, MD 02/03/12 [phone]

## 2012-02-02 NOTE — ED Notes (Signed)
New and old EKG given to Dr. Manus Gunning.

## 2012-02-02 NOTE — ED Notes (Signed)
Pt states he feels he is having a panic attack and that every time he touches himself he turns purple (he is not turning purple).  He is not SI or HI and does not want to be evaluated for psychiatric problems.  PT also c/o sternal chest pain and sob. PT refuses to make eye contact with RN and is very irritable.

## 2012-02-02 NOTE — ED Notes (Signed)
Patient transported to X-ray 

## 2012-02-02 NOTE — ED Notes (Signed)
Pt refuses to answer questions.  Pt refuses to have EKG and states "he just wants a fucking O2 tank and that his lungs keep talking to each other and that's why they hurt".

## 2012-02-02 NOTE — ED Notes (Signed)
Patient refused EKG.

## 2012-02-02 NOTE — ED Notes (Addendum)
telepsych setup completed. Waiting for psych Dr.

## 2012-02-02 NOTE — ED Provider Notes (Signed)
History     CSN: [id number]  Arrival date & time 02/02/12  1610   First MD Initiated Contact with Patient 02/02/12 1906      Chief Complaint  Patient presents with  . Panic Attack    (Consider location/radiation/quality/duration/timing/severity/associated sxs/prior treatment) HPI Comments: Erik [name] is a 37 y.o. male with history of depression, Schizoaffective disorder, anxiety and suicidal thoughts. He presents here stating that he is having a panic attack. Patient also complains of a persistent shortness of breath and cough onset 1 week ago. There is associated subjective fever and diarrhea. Patient denies SI or HI. He denies auditory or visual hallucinations. Patient has not been taking his medications as instructed, states that he can't afford them.  Patient has been seen here multiple times this month for behavior health related problems.   Patient is a 37 y.o. male presenting with general illness. The history is provided by the patient.  Illness  The current episode started 5 to 7 days ago. The onset was gradual. The problem occurs continuously. The problem has been gradually worsening. The problem is moderate. Nothing relieves the symptoms. Nothing aggravates the symptoms. Associated symptoms include a fever (subjective), congestion, rhinorrhea, cough and URI. Pertinent negatives include no abdominal pain, no diarrhea, no nausea, no vomiting, no headaches, no rash and no eye pain.    Past Medical History  Diagnosis Date  . Schizoaffective disorder   . Depression   . Suicidal thoughts   . Anxiety     History reviewed. No pertinent past surgical history.  History reviewed. No pertinent family history.  History  Substance Use Topics  . Smoking status: Current Every Day Smoker -- 0.5 packs/day for 12 years    Types: Cigarettes  . Smokeless tobacco: Not on file  . Alcohol Use: Yes     Comment: social/rare      Review of Systems  Constitutional: Positive for fever  (subjective). Negative for chills.  HENT: Positive for congestion and rhinorrhea.   Eyes: Negative for pain and visual disturbance.  Respiratory: Positive for cough and shortness of breath.   Cardiovascular: Negative for chest pain and leg swelling.  Gastrointestinal: Negative for nausea, vomiting, abdominal pain and diarrhea.  Genitourinary: Negative for flank pain and difficulty urinating.  Skin: Negative for color change and rash.  Neurological: Negative for dizziness and headaches.  Psychiatric/Behavioral: Negative for suicidal ideas, hallucinations and self-injury.  All other systems reviewed and are negative.    Allergies  Review of patient's allergies indicates no known allergies.  Home Medications  No current outpatient prescriptions on file.  BP 109/77  Pulse 93  Temp 98.4 F (36.9 C) (Oral)  Resp 18  SpO2 99%  Physical Exam  Nursing note and vitals reviewed. Constitutional: He is oriented to person, place, and time. He appears well-developed and well-nourished. No distress.       Sitting up in bed. NAD. Occasionally speaking to himself.   HENT:  Head: Normocephalic and atraumatic.  Eyes: Conjunctivae normal are normal. Right eye exhibits no discharge. Left eye exhibits no discharge.  Neck: No tracheal deviation present.  Cardiovascular: Normal rate, regular rhythm, normal heart sounds and intact distal pulses.   Pulmonary/Chest: Effort normal and breath sounds normal. No stridor. No respiratory distress. He has no wheezes. He has no rales.  Abdominal: Soft. He exhibits no distension. There is no tenderness. There is no guarding.  Musculoskeletal: He exhibits no edema and no tenderness.  Neurological: He is alert and oriented to person, place,  and time. He has normal strength. No cranial nerve deficit or sensory deficit. He displays a negative Romberg sign. Coordination normal. GCS eye subscore is 4. GCS verbal subscore is 5. GCS motor subscore is 6.  Skin: Skin is  warm and dry.  Psychiatric: He expresses no homicidal and no suicidal ideation.    ED Course  Procedures (including critical care time)  Labs Reviewed - No data to display Dg Chest 2 View  02/02/2012  *RADIOLOGY REPORT*  Clinical Data: Shortness of breath and cough.  CHEST - 2 VIEW  Comparison: 10/01/2011  Findings: The cardiomediastinal silhouette is unremarkable. There is no evidence of focal airspace disease, pulmonary edema, suspicious pulmonary nodule/mass, pleural effusion, or pneumothorax. No acute bony abnormalities are identified.  IMPRESSION: No evidence of active cardiopulmonary disease.   Original Report Authenticated By: Harmon Pier, M.D.      1. Anxiety   2. SOB (shortness of breath)       MDM  Patient here for psychiatric evaluation. Reports "lungs talking". Talking to himself. Also reports SOB. CXR unremarkable. No wheezing on exam. No lower extremity edema. Patient refuses labs. A&O x4.  no Suicidal Ideation no Homicidal Ideation no Auditory Hallucinations no Visual Hallucinations   denies Etoh denies Drugs   no evidence of self harm   Psychiatry consulted    Per psychiatry:  Do not believe patient is a danger to themselves. Currently denies SI or HI. Safe for dc home. Dc home. Return precautions given. Follow up with primary care physician.   Labs and imaging reviewed by myself and considered in medical decision making if ordered. Imaging interpreted by radiology.   Discussed case with Dr. Manus Gunning who is in agreement with assessment and plan.          Stevie Kern, MD 02/03/12 [phone]

## 2012-02-03 NOTE — ED Provider Notes (Signed)
I saw and evaluated the patient, reviewed the resident's note and I agree with the findings and plan.  See my additional note.  [name], MD 02/03/12 [phone]

## 2012-02-06 ENCOUNTER — Emergency Department (HOSPITAL_COMMUNITY)
Admission: EM | Admit: 2012-02-06 | Discharge: 2012-02-07 | Disposition: A | Discharge status: Home / Self Care | Payer: Medicaid Other | Attending: Emergency Medicine | Admitting: Emergency Medicine

## 2012-02-06 ENCOUNTER — Encounter (HOSPITAL_COMMUNITY): Payer: Self-pay | Admitting: Emergency Medicine

## 2012-02-06 DIAGNOSIS — F17200 Nicotine dependence, unspecified, uncomplicated: Secondary | ICD-10-CM | POA: Insufficient documentation

## 2012-02-06 DIAGNOSIS — IMO0001 Reserved for inherently not codable concepts without codable children: Secondary | ICD-10-CM | POA: Insufficient documentation

## 2012-02-06 DIAGNOSIS — F259 Schizoaffective disorder, unspecified: Secondary | ICD-10-CM | POA: Insufficient documentation

## 2012-02-06 DIAGNOSIS — R6889 Other general symptoms and signs: Secondary | ICD-10-CM | POA: Insufficient documentation

## 2012-02-06 DIAGNOSIS — M791 Myalgia, unspecified site: Secondary | ICD-10-CM

## 2012-02-06 DIAGNOSIS — Z87828 Personal history of other (healed) physical injury and trauma: Secondary | ICD-10-CM | POA: Insufficient documentation

## 2012-02-06 DIAGNOSIS — Z8659 Personal history of other mental and behavioral disorders: Secondary | ICD-10-CM | POA: Insufficient documentation
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
## 2012-02-07 MED ORDER — NAPROXEN 500 MG PO TABS
250.0000 mg | ORAL_TABLET | Freq: Once | ORAL | Status: AC
  Administered 2012-02-07: 250 mg via ORAL
  Filled 2012-02-07: qty 2

## 2012-02-07 MED ORDER — IBUPROFEN 800 MG PO TABS
800.0000 mg | ORAL_TABLET | Freq: Once | ORAL | Status: DC
  Filled 2012-02-07: qty 1

## 2012-02-07 MED ORDER — DIPHENHYDRAMINE HCL 25 MG PO TABS: 25.0000 mg | ORAL_TABLET | Freq: Four times a day (QID) | ORAL | Status: DC

## 2012-02-07 MED ORDER — NAPROXEN 375 MG PO TABS: 375.0000 mg | ORAL_TABLET | Freq: Two times a day (BID) | ORAL | Status: DC
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
## 2012-02-08 NOTE — ED Provider Notes (Signed)
Medical screening examination/treatment/procedure(s) were performed by non-physician practitioner and as supervising physician I was immediately available for consultation/collaboration.  Jones Skene, M.D.     Jones Skene, MD 02/08/12 [id number]

## 2013-01-13 IMAGING — CR DG CHEST 2V
2 series · 2 of 2 positions shown · non-contrast
Comparison: Chest radiograph 01/11/2011

CLINICAL DATA: Short of breath

CHEST - 2 VIEW

[w chest pa]
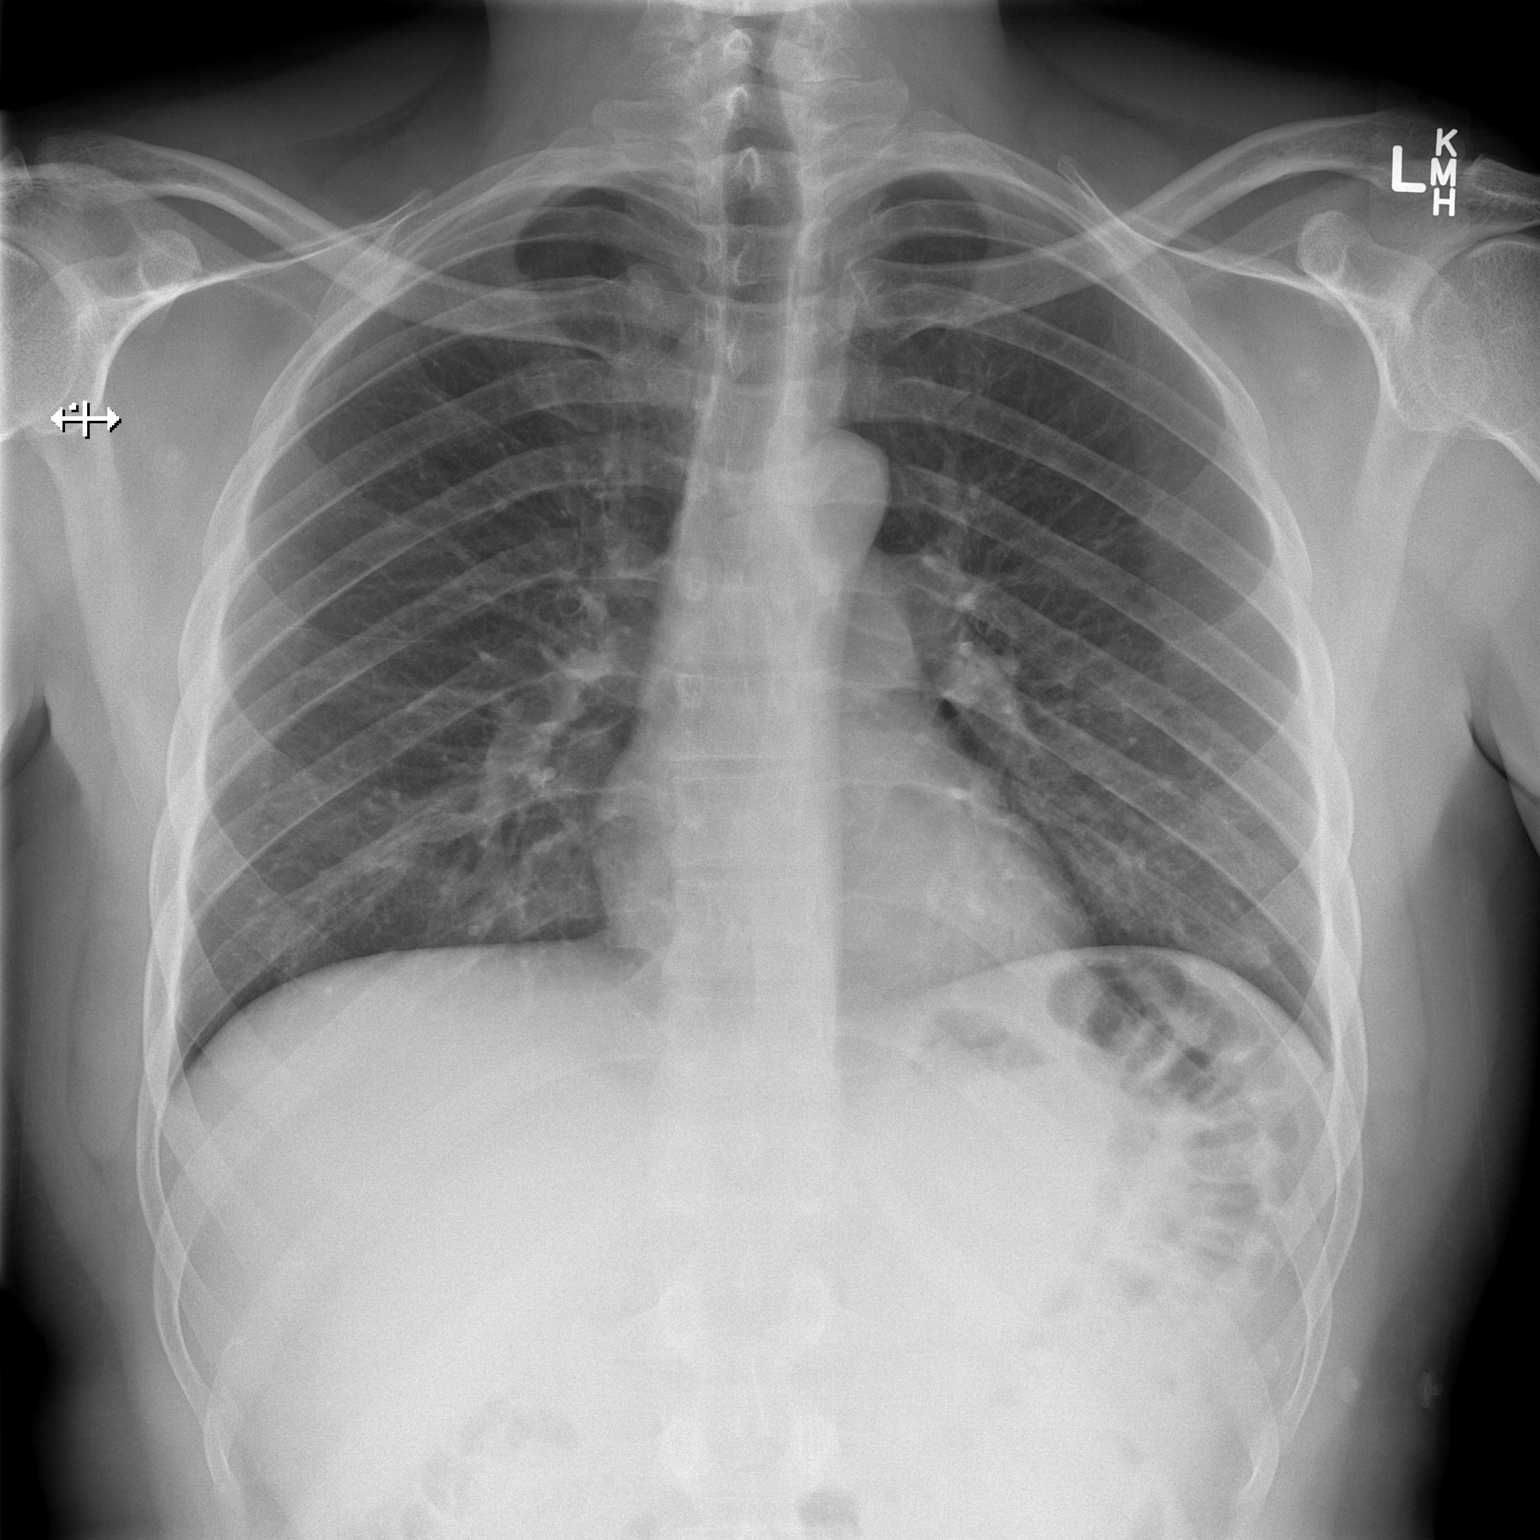

[w chest lat]
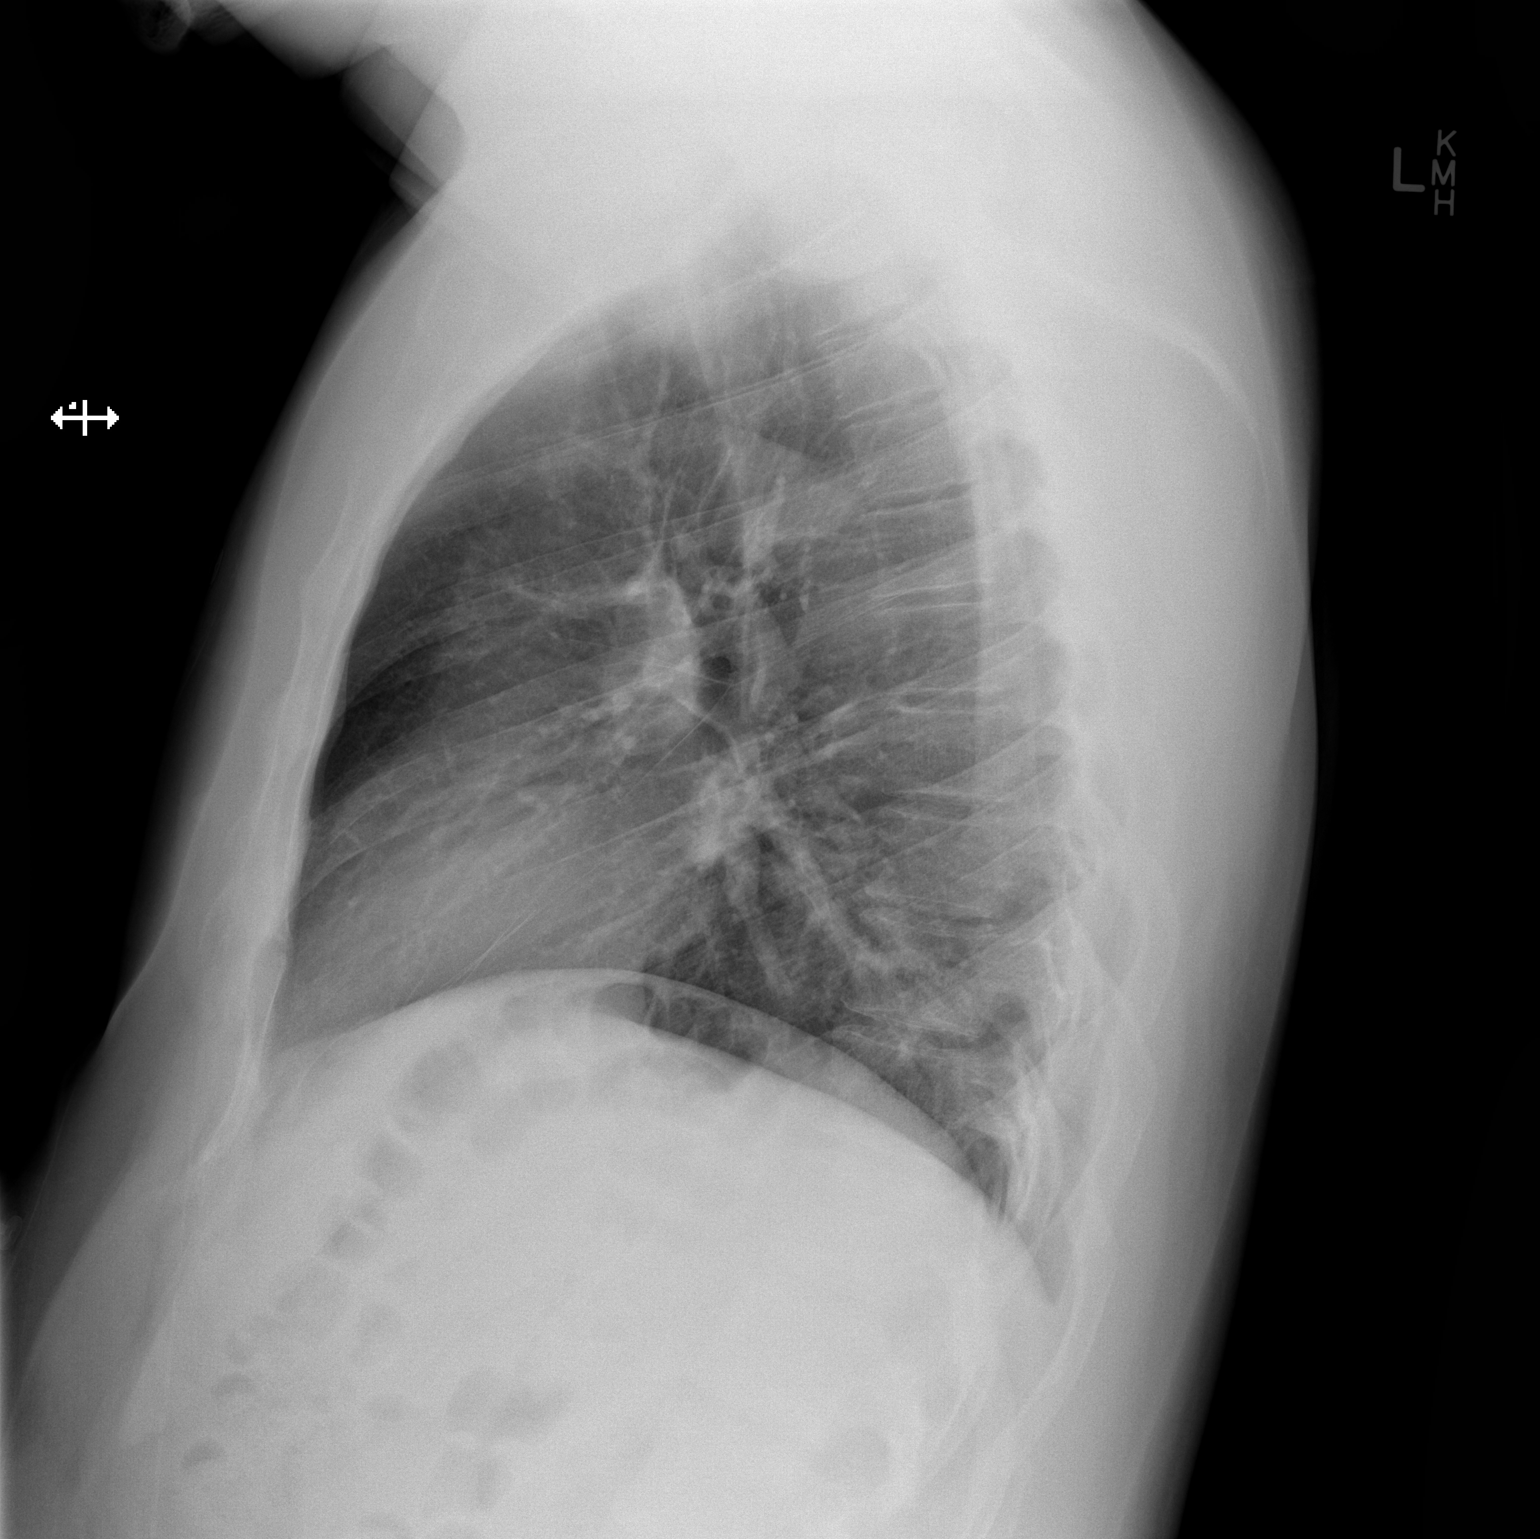

[2 of 2 positions shown; findings below may reference images not displayed]

FINDINGS: Normal mediastinum and cardiac silhouette.  Normal
pulmonary  vasculature.  No evidence of effusion, infiltrate, or
pneumothorax.  No acute bony abnormality.
IMPRESSION: No acute cardiopulmonary process.

## 2013-05-17 IMAGING — CR DG CHEST 2V
2 series · 2 of 2 positions shown · non-contrast
Comparison: 10/01/2011

CLINICAL DATA: Shortness of breath and cough.

CHEST - 2 VIEW

[w chest pa]
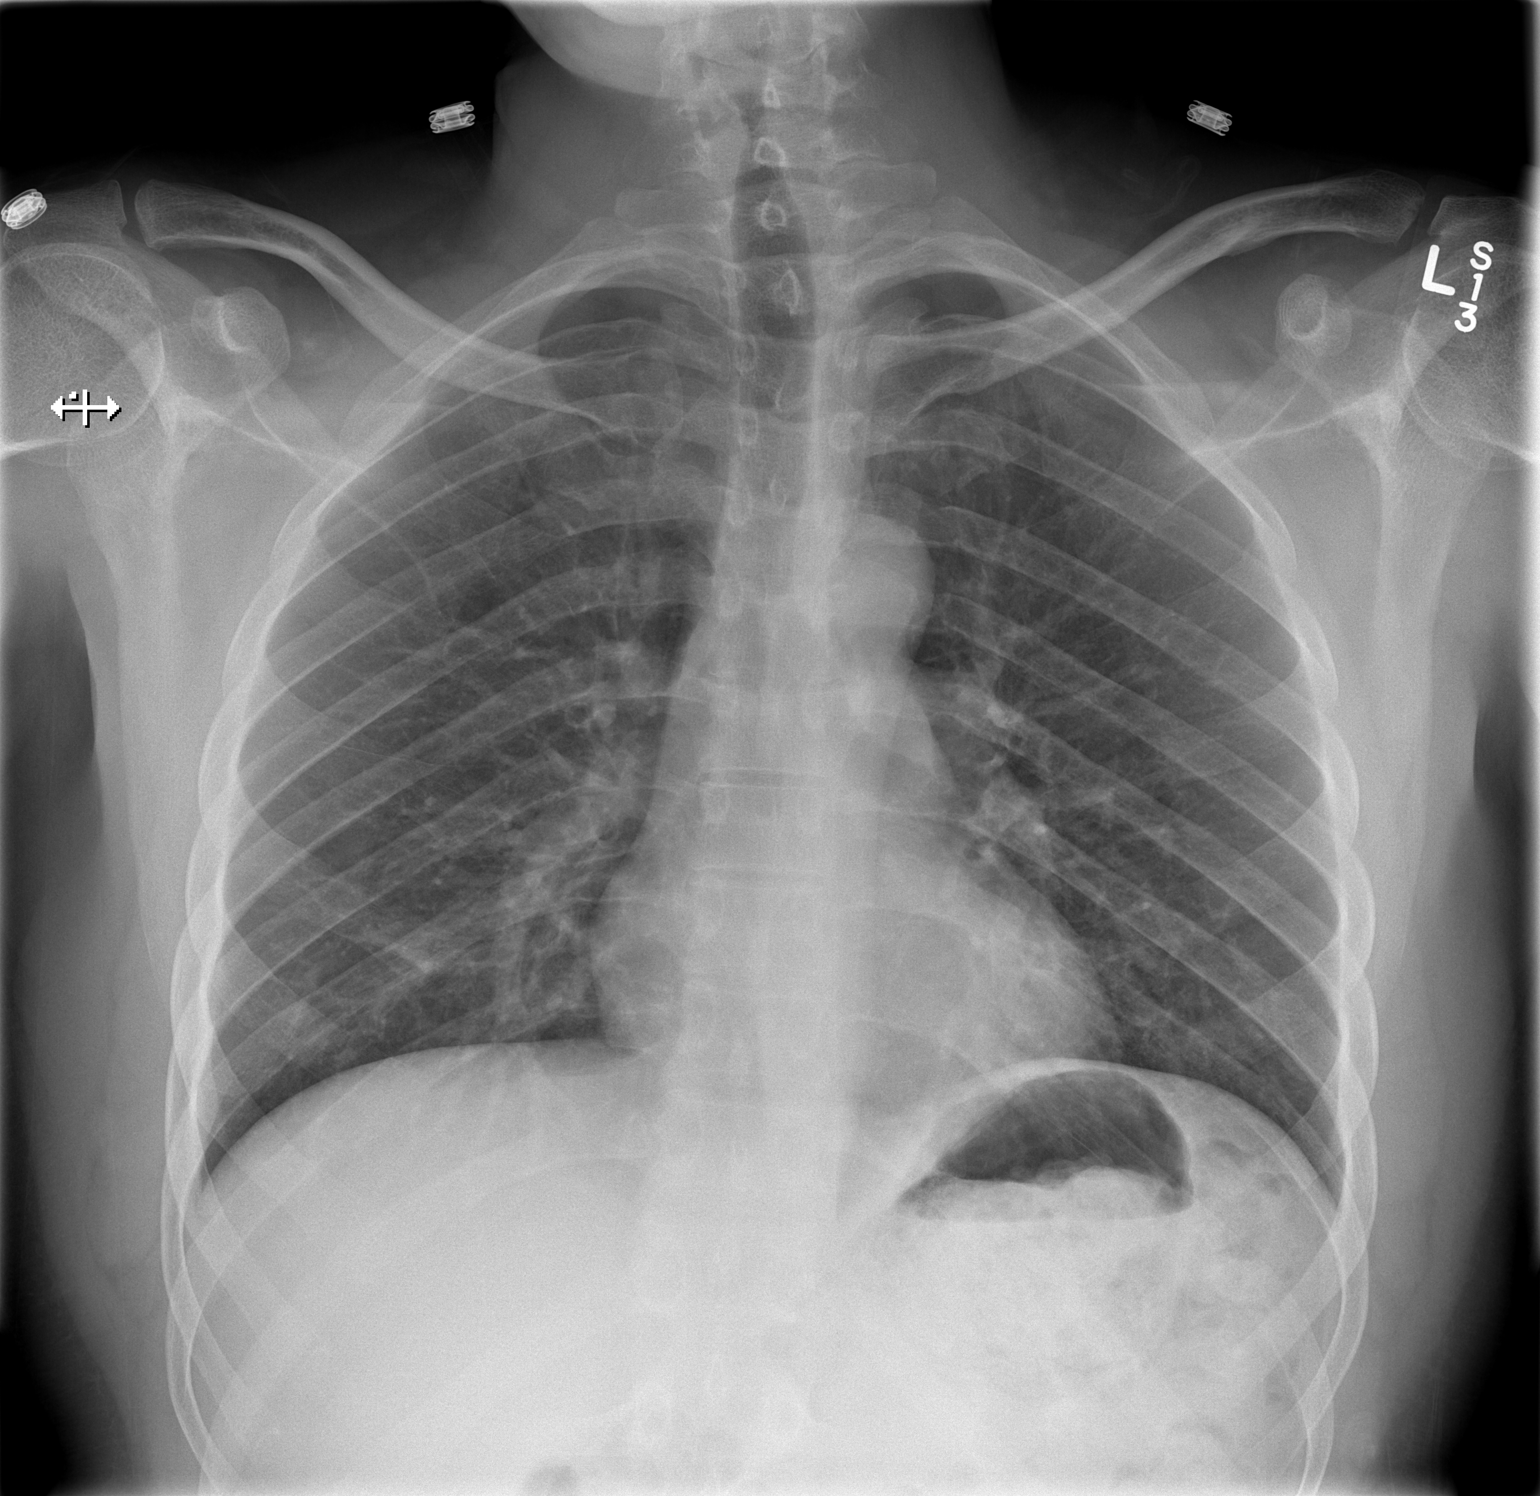

[w chest lat]
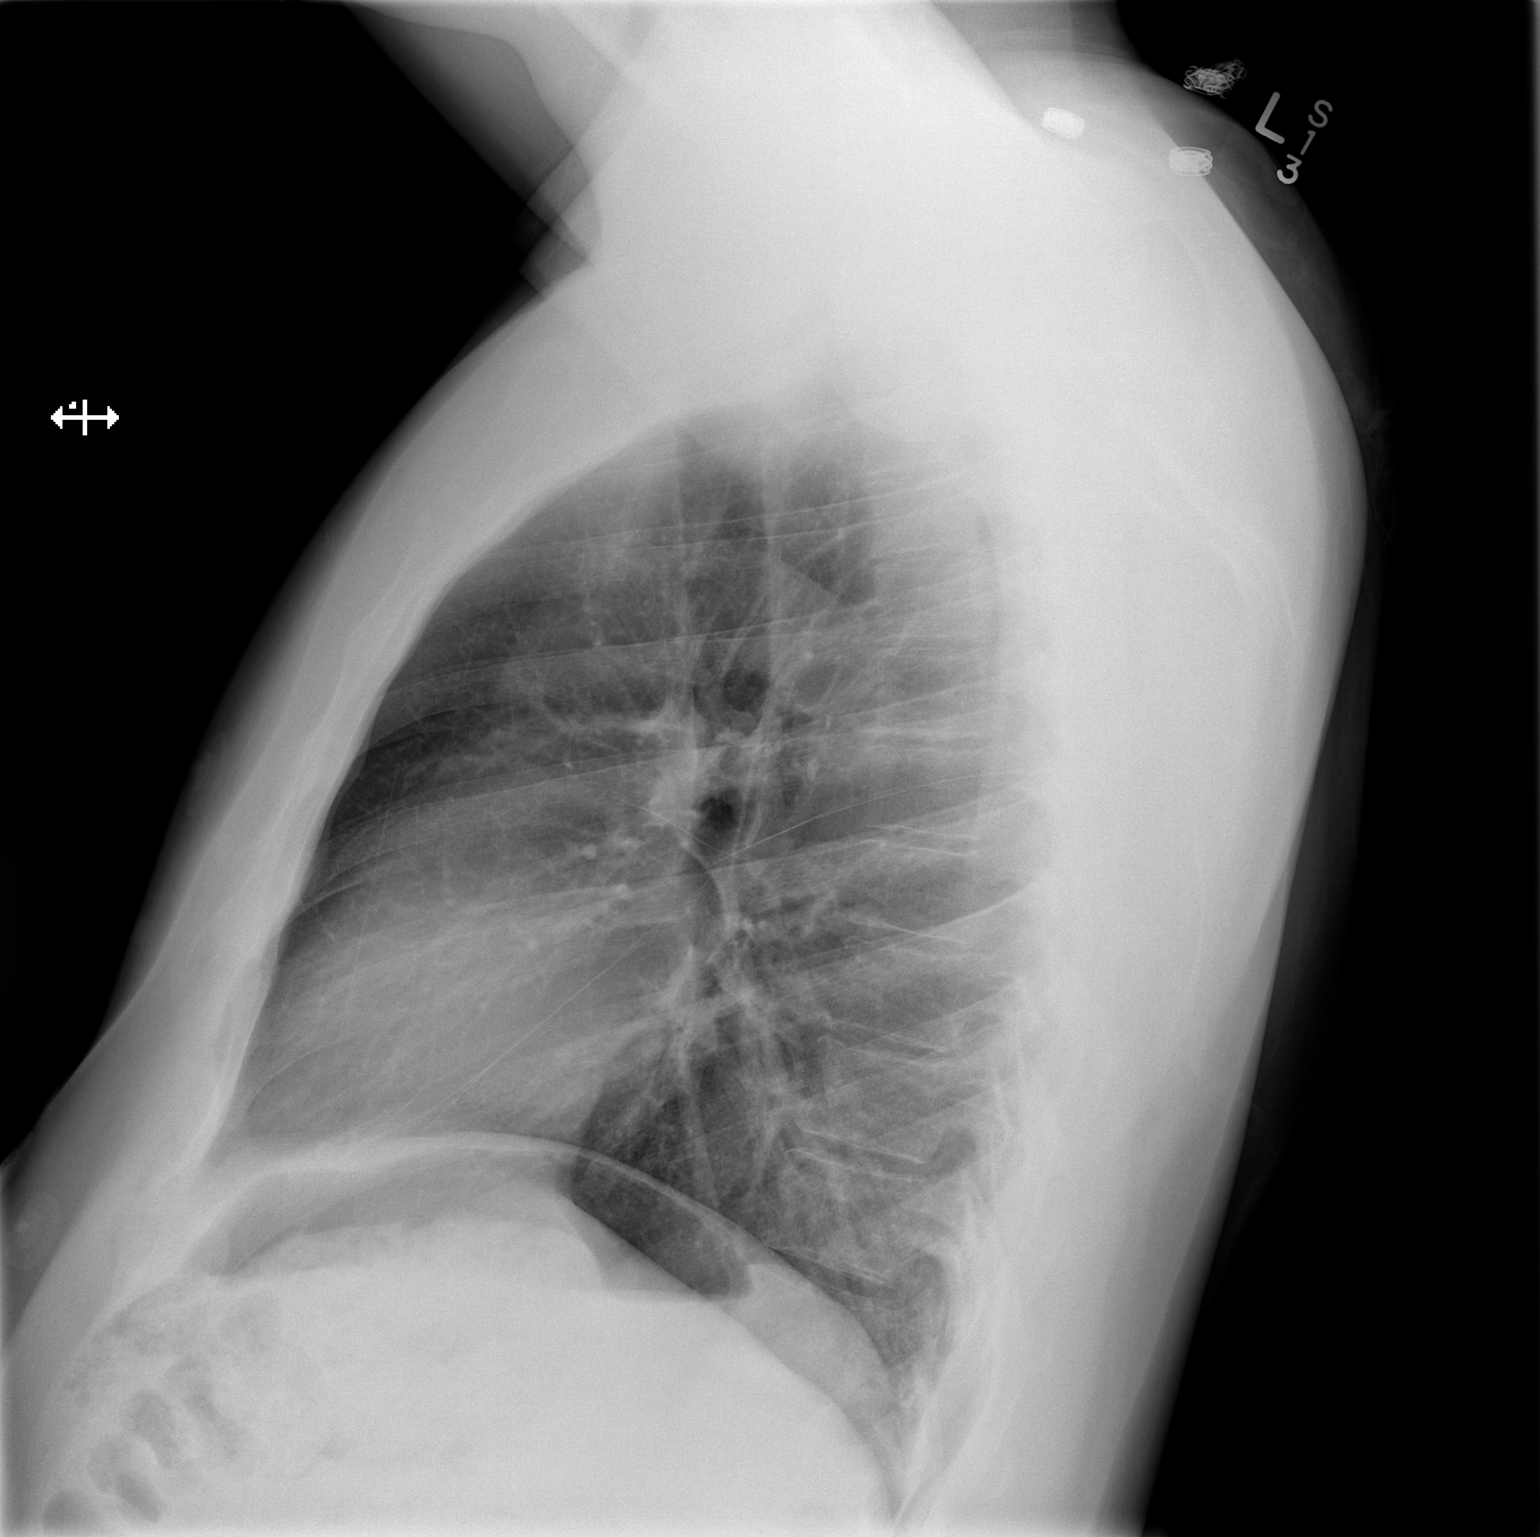

[2 of 2 positions shown; findings below may reference images not displayed]

FINDINGS: The cardiomediastinal silhouette is unremarkable.
There is no evidence of focal airspace disease, pulmonary edema,
suspicious pulmonary nodule/mass, pleural effusion, or
pneumothorax.
No acute bony abnormalities are identified.
IMPRESSION: No evidence of active cardiopulmonary disease.

## 2015-11-06 ENCOUNTER — Emergency Department (HOSPITAL_COMMUNITY)
Admission: EM | Admit: 2015-11-06 | Discharge: 2015-11-06 | Disposition: A | Discharge status: Home / Self Care | Payer: Medicaid Other | Attending: Emergency Medicine | Admitting: Emergency Medicine

## 2015-11-06 ENCOUNTER — Encounter (HOSPITAL_COMMUNITY): Payer: Self-pay | Admitting: *Deleted

## 2015-11-06 DIAGNOSIS — F17210 Nicotine dependence, cigarettes, uncomplicated: Secondary | ICD-10-CM | POA: Diagnosis not present

## 2015-11-06 DIAGNOSIS — Z79899 Other long term (current) drug therapy: Secondary | ICD-10-CM | POA: Insufficient documentation

## 2015-11-06 DIAGNOSIS — M791 Myalgia, unspecified site: Secondary | ICD-10-CM

## 2015-11-06 DIAGNOSIS — R51 Headache: Secondary | ICD-10-CM | POA: Diagnosis not present

## 2015-11-06 DIAGNOSIS — R52 Pain, unspecified: Secondary | ICD-10-CM | POA: Diagnosis present

## 2015-11-06 DIAGNOSIS — R519 Headache, unspecified: Secondary | ICD-10-CM

## 2015-11-06 MED ORDER — IBUPROFEN 400 MG PO TABS: 400.0000 mg | ORAL_TABLET | Freq: Four times a day (QID) | ORAL | 0 refills | Status: AC | PRN

## 2015-11-06 NOTE — ED Provider Notes (Signed)
[facility]-EMERGENCY DEPT Provider Note   CSN: [id number] Arrival date & time: 11/06/15  0250     History   Chief Complaint Chief Complaint  Patient presents with  . Generalized Body Aches    HPI Erik [name] is a 40 y.o. male.  Patient is 40 yo M with PMH of anxiety, depression, and schizoaffective disorder, presenting with chief complaint of body aches and dull frontal headache that started just PTA. He tried nothing to relieve the body aches or headache, but states his symptoms have since resolved since being evaluated, and requesting d/c from hospital. He states he also came to ED for "financial assistance," but denies any homelessness and lives in an apartment. He denies any anxiety, depression, hallucinations, or SI/HI. He denies any recent trauma, numbness, weakness, dizziness, or changes in vision. He also denies any neck pain, sore throat, cough, or sick contacts. He denies any alcohol or drug use.      Past Medical History:  Diagnosis Date  . Anxiety   . Depression   . Schizoaffective disorder   . Suicidal thoughts     Patient Active Problem List   Diagnosis Date Noted  . Homicidal ideation 01/28/2012  . 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 No pertinent surgical history.     Home Medications    Prior to Admission medications   Medication Sig Start Date End Date Taking? Authorizing Provider  diphenhydrAMINE (BENADRYL) 25 MG tablet Take 1 tablet (25 mg total) by mouth every 6 (six) hours. Patient not taking: Reported on 11/06/2015 02/07/12   Marlon Peliffany Greene, PA-C  naproxen (NAPROSYN) 375 MG tablet Take 1 tablet (375 mg total) by mouth 2 (two) times daily. Patient not taking: Reported on 11/06/2015 02/07/12   Marlon Peliffany Greene, PA-C    Family History History reviewed. No pertinent family history.  Social History Social History  Substance Use Topics  . Smoking status: Current Every Day Smoker    Packs/day: 0.50    Years: 12.00   Types: Cigarettes  . Smokeless tobacco: Never Used  . Alcohol use Yes     Comment: social/rare     Allergies   Review of patient's allergies indicates no known allergies.   Review of Systems Review of Systems  Constitutional: Negative for chills and fever.  HENT: Negative for ear pain and sore throat.   Eyes: Negative for pain and visual disturbance.  Respiratory: Negative for cough and shortness of breath.   Cardiovascular: Negative for chest pain, palpitations and leg swelling.  Gastrointestinal: Negative for abdominal pain, blood in stool, nausea and vomiting.  Genitourinary: Negative for dysuria, flank pain and hematuria.  Musculoskeletal: Positive for myalgias. Negative for back pain and neck pain.  Skin: Negative for color change and rash.  Neurological: Positive for headaches. Negative for dizziness, seizures, syncope, weakness and numbness.     Physical Exam Updated Vital Signs BP 123/75 (BP Location: Right Arm)   Pulse 78   Temp 97.5 F (36.4 C) (Oral)   Resp 20   Ht 6\' 2"  (1.88 m)   Wt 103.4 kg   SpO2 98%   BMI 29.27 kg/m   Physical Exam  Constitutional: He appears well-developed and well-nourished. No distress.  Patient in no acute distress, requesting food and to be d/c home.  HENT:  Head: Normocephalic and atraumatic.  Mouth/Throat: Oropharynx is clear and moist.  Eyes: Conjunctivae are normal.  Neck: Normal range of motion.  Cardiovascular: Normal rate.   Pulmonary/Chest: Effort normal. No respiratory distress.  Musculoskeletal: Normal range of motion. He exhibits no edema or deformity.  Neurological: He is alert.  Skin: Skin is warm and dry.  Psychiatric: He has a normal mood and affect. His speech is normal and behavior is normal. Thought content is not paranoid and not delusional. He expresses no homicidal and no suicidal ideation. He expresses no suicidal plans and no homicidal plans.  Nursing note and vitals reviewed.    ED Treatments /  Results  Labs (all labs ordered are listed, but only abnormal results are displayed) Labs Reviewed - No data to display  EKG  EKG Interpretation None       Radiology No results found.  Procedures Procedures (including critical care time)  Medications Ordered in ED Medications - No data to display   Initial Impression / Assessment and Plan / ED Course  I have reviewed the triage vital signs and the nursing notes.  Pertinent labs & imaging results that were available during my care of the patient were reviewed by me and considered in my medical decision making (see chart for details).  Clinical Course   Patient is 40 yo M presenting with chief complaint of body aches and dull frontal headache. Both symptoms have resolved, and he is requesting food and to be d/c home. Mentioned needing "financial assistance," but denied any homelessness, anxiety, depression, hallucinations, or SI/HI. He is afebrile, VSS, well-appearing, and clinically stable for d/c home with no further workup. Given contact information to establish care with PCP and various behavioral health resources for any future mental health concerns. Return precautions discussed for any fevers, chills, worsening headache, depression, anxiety, hallucinations, or SI/HI.  Final Clinical Impressions(s) / ED Diagnoses   Final diagnoses:  Muscle ache  Nonintractable headache, unspecified chronicity pattern, unspecified headache type    New Prescriptions New Prescriptions   IBUPROFEN (ADVIL,MOTRIN) 400 MG TABLET    Take 1 tablet (400 mg total) by mouth every 6 (six) hours as needed.     Allie BossierDaryl F de Villier II, GeorgiaPA 11/06/15 [phone]    Tomasita CrumbleAdeleke Oni, MD 11/06/15 1420

## 2015-11-06 NOTE — ED Triage Notes (Signed)
Patient is alert and oriented x4.  He is complaining of body aches that started yesterday morning.  Patient states that he felt he had a temperature but temp was 97.5 on arrival to the ED.  Patient also states that he is dealing with depression.

## 2015-11-06 NOTE — Discharge Instructions (Signed)
Please follow up with [facility] to establish care with primary care provider, and for reevaluation of your muscle aches and headache. Also, listed below are resources that may assist you with any mental health concerns or financial stressors.  Substance Abuse Treatment Programs  Intensive Outpatient Programs [facility]     [address]      [place], [place]                   [phone]       [facility] [address] [place], [place] [phone]  [facility]     (Inpatient and outpatient)     [address]           [phone]    [facility] [phone] (Suboxone and Methadone)  [address]      [place], [place] [address]      [phone]       [address] [place], [place] [phone]  [facility] (Outpatient/Inpatient, Chemical)    (insurance only) [phone]             [facility] (Groups & Residential) [place], [place] [phone]     [facility]     [address]     [place], [place]      [phone]       [facility] (for caregivers and family) [phone] [address] [place], [place] [phone]      Residential Treatment Programs [facility]      [address] [place], [place] [address]  [phone]       [facility] [address] [place], [place] [address] [phone]  [facility] [place]        [phone]       [facility] [phone]  [facility] ([facility]             [address]                                         [place], [place]                                                [phone] or [phone]                               [facility] [place] [address] [place], [address] [phone]  [facility]    [address]      [place], [place]     [phone]       [facility] [address] [place], [place] [phone]  [facility]   [address] [place], [place] [address]     [phone]      Admissions: 8am-3pm M-F  [facility] (RTS) [address] [place], [place] [phone]  [facility] Program: Residential Program [phone] Days)   [place], [place]      [phone] or [phone]     ADATC: [facility] [place], [place] (Walk in Hours over the weekend or by referral)  [facility] [address] [place], [place], [place] [address] [phone]  Crisis Mobile: [facility]:  [phone] (for crisis response 24 hours a day) [facility] Hotline:      [phone] Outpatient Psychiatry and Counseling  [facility]: Mobile Crisis Management 24 hours:  [phone]  [facility] sliding scale fee and walk in schedule: M-F 8am-12pm/1pm-3pm [address]  [place], [place] [address] [phone]  [facility] [address] [place], [place] [address] [phone]  [facility] ([facility])- new patient walk-in appointments available Monday - Friday 8am -3pm.          [address] [place], [place] [address] [phone] or crisis line- [phone]  [facility]/ Intensive Outpatient Therapy Program [address] [place], [place] [address] [phone]  [facility]                  Crisis Services      [phone]      [address]     [place], [place] [address]                 [facility]   [facility] [phone] [address] [place], [place] [address]   [facility]          [address] [place],  [place], [place] [address]       [phone]  [facility] [address] [place], [place] [address] [phone]  [facility]    [address]    [place], [place] [address]     [phone]       Andee PolesParish McKinney, MD     [address]     [place] [address]     [phone]       [facility] [address] [place] [address]  [facility]     [address]     [place], [place]      [phone]       [facility] Eulogio DitchShamsher Ahluwalia, MD [address] [place], [place] [address] [phone]  [facility]     [address]     [place], [place] [address]     [phone]       [facility] [address] [place], [place] [address] [phone] Resources for Temporary Residential Assistance/Crisis Centers  DAY CENTERS [facility]) M-F 8am-3pm   [address] [place], [place] [address]   [phone] Services include: laundry, barbering, support groups, case management, phone  & computer access, showers, AA/NA mtgs, mental health/substance abuse nurse, job skills class, disability information, VA assistance, spiritual classes, etc.   HOMELESS SHELTERS  [facility]     [facility]   [address], [place]     [phone]              [facility] (women and children)       [address] [place], [place] [address] [phone] [email or web address] for application and process Application Required  [facility]   [address]    [place] [address]     [phone]                    [facility]  [address] [place], [place] [address] [phone](schedule application appt.) Application Required  [facility] (women only)    [address]     [place], [place] [address]     [phone]      Intake starts 6pm daily Need valid ID, SSC, & Police report [facility] [place] [address] [place],  [place] [phone] Application Required  [facility] (men only)     [address]      [place], [place]     [phone]       [facility] At [facility] (Pregnant women only) [address] [place], [place] [phone]  [facility]      [address]      [place], [place] [address]     [phone]             [facility] [place] [facility] [address] [place], [place] [phone] 90 day commitment/SA/Application process  [facility](men only)     [address]     [place], [place]     [phone]       Check-in at [facility]pm            Crisis Ministry [facility] [address] [place], [place] [address] [phone] Men/Women/Women and Children must be there by 7 pm  [facility] [place], [place] [phone]

## 2015-11-30 ENCOUNTER — Encounter (HOSPITAL_COMMUNITY): Payer: Self-pay | Admitting: Emergency Medicine

## 2015-11-30 ENCOUNTER — Emergency Department (HOSPITAL_COMMUNITY)
Admission: EM | Admit: 2015-11-30 | Discharge: 2015-11-30 | Disposition: A | Discharge status: Home / Self Care | Payer: Medicaid Other | Attending: Emergency Medicine | Admitting: Emergency Medicine

## 2015-11-30 DIAGNOSIS — Z79899 Other long term (current) drug therapy: Secondary | ICD-10-CM | POA: Diagnosis not present

## 2015-11-30 DIAGNOSIS — F200 Paranoid schizophrenia: Secondary | ICD-10-CM

## 2015-11-30 DIAGNOSIS — F17210 Nicotine dependence, cigarettes, uncomplicated: Secondary | ICD-10-CM | POA: Diagnosis not present

## 2015-11-30 DIAGNOSIS — R410 Disorientation, unspecified: Secondary | ICD-10-CM | POA: Diagnosis present

## 2015-11-30 LAB — CBC WITH DIFFERENTIAL/PLATELET
Basophils Absolute: 0 10*3/uL (ref 0.0–0.1)
Basophils Relative: 1 %
Eosinophils Absolute: 0.3 10*3/uL (ref 0.0–0.7)
Eosinophils Relative: 4 %
HCT: 44.4 % (ref 39.0–52.0)
Hemoglobin: 15.3 g/dL (ref 13.0–17.0)
Lymphocytes Relative: 25 %
Lymphs Abs: 2 10*3/uL (ref 0.7–4.0)
MCH: 29.9 pg (ref 26.0–34.0)
MCHC: 34.5 g/dL (ref 30.0–36.0)
MCV: 86.9 fL (ref 78.0–100.0)
Monocytes Absolute: 0.6 10*3/uL (ref 0.1–1.0)
Monocytes Relative: 8 %
Neutro Abs: 5.1 10*3/uL (ref 1.7–7.7)
Neutrophils Relative %: 62 %
Platelets: 167 10*3/uL (ref 150–400)
RBC: 5.11 MIL/uL (ref 4.22–5.81)
RDW: 13.8 % (ref 11.5–15.5)
WBC: 8 10*3/uL (ref 4.0–10.5)

## 2015-11-30 LAB — BASIC METABOLIC PANEL
Anion gap: 7 (ref 5–15)
BUN: 20 mg/dL (ref 6–20)
CO2: 29 mmol/L (ref 22–32)
Calcium: 9.2 mg/dL (ref 8.9–10.3)
Chloride: 103 mmol/L (ref 101–111)
Creatinine, Ser: 0.98 mg/dL (ref 0.61–1.24)
GFR calc Af Amer: >60 mL/min (ref >60)
GFR calc non Af Amer: >60 mL/min (ref >60)
Glucose, Bld: 98 mg/dL (ref 65–99)
Potassium: 3.5 mmol/L (ref 3.5–5.1)
Sodium: 139 mmol/L (ref 135–145)

## 2015-11-30 LAB — RAPID URINE DRUG SCREEN, HOSP PERFORMED
Amphetamines: NOT DETECTED
Barbiturates: NOT DETECTED
Benzodiazepines: NOT DETECTED
Cocaine: NOT DETECTED
Opiates: NOT DETECTED
Tetrahydrocannabinol: NOT DETECTED

## 2015-11-30 LAB — ETHANOL: Alcohol, Ethyl (B): <5 mg/dL (ref <5)

## 2015-11-30 MED ORDER — ALUM & MAG HYDROXIDE-SIMETH 200-200-20 MG/5ML PO SUSP: 30.0000 mL | ORAL | Status: DC | PRN

## 2015-11-30 MED ORDER — BENZTROPINE MESYLATE 0.5 MG PO TABS
0.5000 mg | ORAL_TABLET | Freq: Two times a day (BID) | ORAL | Status: DC
  Administered 2015-11-30: 0.5 mg via ORAL
  Filled 2015-11-30: qty 1

## 2015-11-30 MED ORDER — ZOLPIDEM TARTRATE 5 MG PO TABS: 5.0000 mg | ORAL_TABLET | Freq: Every evening | ORAL | Status: DC | PRN

## 2015-11-30 MED ORDER — ONDANSETRON HCL 4 MG PO TABS: 4.0000 mg | ORAL_TABLET | Freq: Three times a day (TID) | ORAL | Status: DC | PRN

## 2015-11-30 MED ORDER — IBUPROFEN 200 MG PO TABS: 600.0000 mg | ORAL_TABLET | Freq: Three times a day (TID) | ORAL | Status: DC | PRN

## 2015-11-30 MED ORDER — DIVALPROEX SODIUM 500 MG PO DR TAB
500.0000 mg | DELAYED_RELEASE_TABLET | Freq: Two times a day (BID) | ORAL | Status: DC
  Administered 2015-11-30: 500 mg via ORAL
  Filled 2015-11-30: qty 1

## 2015-11-30 MED ORDER — ACETAMINOPHEN 325 MG PO TABS: 650.0000 mg | ORAL_TABLET | ORAL | Status: DC | PRN

## 2015-11-30 MED ORDER — HALOPERIDOL 5 MG PO TABS: 5.0000 mg | ORAL_TABLET | Freq: Two times a day (BID) | ORAL | 0 refills | Status: AC

## 2015-11-30 MED ORDER — DIVALPROEX SODIUM 500 MG PO DR TAB: 500.0000 mg | DELAYED_RELEASE_TABLET | Freq: Two times a day (BID) | ORAL | 0 refills | Status: AC

## 2015-11-30 MED ORDER — BENZTROPINE MESYLATE 0.5 MG PO TABS: 0.5000 mg | ORAL_TABLET | Freq: Two times a day (BID) | ORAL | 0 refills | Status: AC

## 2015-11-30 MED ORDER — HALOPERIDOL 5 MG PO TABS
5.0000 mg | ORAL_TABLET | Freq: Two times a day (BID) | ORAL | Status: DC
  Administered 2015-11-30: 5 mg via ORAL
  Filled 2015-11-30: qty 1

## 2015-11-30 NOTE — Consult Note (Signed)
[place] Psych[facility]ry Consult   Reason for Consult:  Homicidal ideations Referring Physician:  EDP Patient Identification: Khaidyn Staebell MRN:  [id number] Principal Diagnosis: Schizophrenia, paranoid type [facility]) Diagnosis:   Patient Active Problem List   Diagnosis Date Noted  . Schizophrenia, paranoid type ([place]) [F20.0] 03/20/2011    Priority: High  . Homicidal ideation [R45.850] 01/28/2012    Total Time spent with patient: 45 minutes  Subjective:   Opal Mckellips is a 40 y.o. male patient does not warrant admission.  HPI:  40 yo male who presented to the ED in an irritable mood and having homicidal ideations.  Today, he denies suicidal/homicidal ideations, hallucinations, and alcohol/drug abuse.  Reports just being "tired", awakened on assessment.  Stable for discharge as he follows [facility] with [facility].  Past Psychiatric History: schizophrenia  Risk to Self: Suicidal Ideation: No Is patient at risk for suicide?: No What has been your use of drugs/alcohol within the last 12 months?: drinks once a month Risk to Others: Homicidal Ideation: Yes-Currently Present Identified Victim: "whoever bothers me" History of harm to others?: Yes Assessment of Violence:  (pt would not specify) Violent Behavior Description: unknown-pt would not say Does patient have access to weapons?: No Criminal Charges Pending?: No Does patient have a court date: No Prior Inpatient Therapy: Prior Inpatient Therapy: Yes Prior Therapy Dates: 03/2011 Prior Therapy Facilty[facility]Provider[facility] Reason for Treatment: schizoaffective disorder/meds Prior Outpatient Therapy: Prior Outpatient Therapy: Yes Prior Therapy Dates: unknown Prior Therapy Facilty/Provider(s): [facility] Reason for Treatment: meds Does patient have an ACCT team?: Unknown Does patient have Intensive In-House Services?  : Unknown Does patient have [facility] services? : Yes (unknown how recent) Does patient have P4CC services?: No  Past Medical  History:  Past Medical History:  Diagnosis Date  . Anxiety   . Depression   . Schizoaffective disorder   . Suicidal thoughts    History reviewed. No pertinent surgical history. Family History: No family history on file. Family Psychiatric  History: none Social History:  History  Alcohol Use  . Yes    Comment: social/rare     History  Drug Use No    Comment: Pt denies     Social History   Social History  . Marital status: Single    Spouse name: N/A  . Number of children: N/A  . Years of education: N/A   Social History Main Topics  . Smoking status: Current Every Day Smoker    Packs/day: 0.50    Years: 12.00    Types: Cigarettes  . Smokeless tobacco: Never Used  . Alcohol use Yes     Comment: social/rare  . Drug use: No     Comment: Pt denies   . Sexual activity: Yes   Other Topics Concern  . None   Social History Narrative  . None   Additional Social History:    Allergies:  No Known Allergies  Labs:  Results for orders placed or performed during the hospital encounter of 11/30/15 (from the past 48 hour(s))  Basic metabolic panel     Status: None   Collection Time: 11/30/15  2:39 AM  Result Value Ref Range   Sodium 139 135 - 145 mmol/L   Potassium 3.5 3.5 - 5.1 mmol/L   Chloride 103 101 - 111 mmol/L   CO2 29 22 - 32 mmol/L   Glucose, Bld 98 65 - 99 mg/dL   BUN 20 6 - 20 mg/dL   Creatinine, Ser 0.98 0.61 - 1.24 mg/dL   Calcium  9.2 8.9 - 10.3 mg/dL   GFR calc non Af Amer >60 >60 mL/min   GFR calc Af Amer >60 >60 mL/min    Comment: (NOTE) The eGFR has been calculated using the CKD EPI equation. This calculation has not been validated in all clinical situations. eGFR's persistently <60 mL/min signify possible Chronic Kidney Disease.    Anion gap 7 5 - 15  Ethanol     Status: None   Collection Time: 11/30/15  2:39 AM  Result Value Ref Range   Alcohol, Ethyl (B) <5 <5 mg/dL    Comment:        LOWEST DETECTABLE LIMIT FOR SERUM ALCOHOL IS 5  mg/dL FOR MEDICAL PURPOSES ONLY   CBC with Differential     Status: None   Collection Time: 11/30/15  2:39 AM  Result Value Ref Range   WBC 8.0 4.0 - 10.5 K/uL   RBC 5.11 4.22 - 5.81 MIL/uL   Hemoglobin 15.3 13.0 - 17.0 g/dL   HCT 44.4 39.0 - 52.0 %   MCV 86.9 78.0 - 100.0 fL   MCH 29.9 26.0 - 34.0 pg   MCHC 34.5 30.0 - 36.0 g/dL   RDW 13.8 11.5 - 15.5 %   Platelets 167 150 - 400 K/uL   Neutrophils Relative % 62 %   Neutro Abs 5.1 1.7 - 7.7 K/uL   Lymphocytes Relative 25 %   Lymphs Abs 2.0 0.7 - 4.0 K/uL   Monocytes Relative 8 %   Monocytes Absolute 0.6 0.1 - 1.0 K/uL   Eosinophils Relative 4 %   Eosinophils Absolute 0.3 0.0 - 0.7 K/uL   Basophils Relative 1 %   Basophils Absolute 0.0 0.0 - 0.1 K/uL  Urine rapid drug screen (hosp performed)     Status: None   Collection Time: 11/30/15  3:41 AM  Result Value Ref Range   Opiates NONE DETECTED NONE DETECTED   Cocaine NONE DETECTED NONE DETECTED   Benzodiazepines NONE DETECTED NONE DETECTED   Amphetamines NONE DETECTED NONE DETECTED   Tetrahydrocannabinol NONE DETECTED NONE DETECTED   Barbiturates NONE DETECTED NONE DETECTED    Comment:        DRUG SCREEN FOR MEDICAL PURPOSES ONLY.  IF CONFIRMATION IS NEEDED FOR ANY PURPOSE, NOTIFY LAB WITHIN 5 DAYS.        LOWEST DETECTABLE LIMITS FOR URINE DRUG SCREEN Drug Class       Cutoff (ng/mL) Amphetamine      1000 Barbiturate      200 Benzodiazepine   025 Tricyclics       427 Opiates          300 Cocaine          300 THC              50     Current Facility-Administered Medications  Medication Dose Route Frequency Provider Last Rate Last Dose  . acetaminophen (TYLENOL) tablet 650 mg  650 mg Oral Q4H PRN Dalia Heading, PA-C      . alum & mag hydroxide-simeth (MAALOX/MYLANTA) 200-200-20 MG/5ML suspension 30 mL  30 mL Oral PRN Dalia Heading, PA-C      . ibuprofen (ADVIL,MOTRIN) tablet 600 mg  600 mg Oral Q8H PRN Dalia Heading, PA-C      . ondansetron (ZOFRAN)  tablet 4 mg  4 mg Oral Q8H PRN Dalia Heading, PA-C      . zolpidem (AMBIEN) tablet 5 mg  5 mg Oral QHS PRN Dalia Heading, PA-C  Current Outpatient Prescriptions  Medication Sig Dispense Refill  . ibuprofen (ADVIL,MOTRIN) 400 MG tablet Take 1 tablet (400 mg total) by mouth every 6 (six) hours as needed. (Patient not taking: Reported on 11/30/2015) 30 tablet 0    Musculoskeletal: Strength & Muscle Tone: within normal limits Gait & Station: normal Patient leans: N/A  Psychiatric Specialty Exam: Physical Exam  Constitutional: He is oriented to person, place, and time. He appears well-developed and well-nourished.  HENT:  Head: Normocephalic.  Neck: Normal range of motion.  Respiratory: Effort normal.  Musculoskeletal: Normal range of motion.  Neurological: He is alert and oriented to person, place, and time.  Psychiatric: He has a normal mood and affect. His speech is normal and behavior is normal. Judgment and thought content normal. Cognition and memory are normal.    Review of Systems  Constitutional: Negative.   HENT: Negative.   Eyes: Negative.   Respiratory: Negative.   Cardiovascular: Negative.   Gastrointestinal: Negative.   Genitourinary: Negative.   Musculoskeletal: Negative.   Skin: Negative.   Neurological: Negative.   Endo/Heme/Allergies: Negative.   Psychiatric/Behavioral: Positive for substance abuse.    Blood pressure 101/74, pulse 93, temperature 97.7 F (36.5 C), temperature source Oral, resp. rate 20, height _0  (1.88 m), weight 103.4 kg (228 lb), SpO2 97 %.Body mass index is 29.27 kg/m.  General Appearance: Casual  Eye Contact:  Good  Speech:  Normal Rate  Volume:  Normal  Mood:  Euthymic  Affect:  Congruent  Thought Process:  Coherent and Descriptions of Associations: Intact  Orientation:  Full (Time, Place, and Person)  Thought Content:  WDL  Suicidal Thoughts:  No  Homicidal Thoughts:  No  Memory:  Immediate;   Good Recent;    Good Remote;   Good  Judgement:  Fair  Insight:  Fair  Psychomotor Activity:  Normal  Concentration:  Concentration: Good and Attention Span: Good  Recall:  Good  Fund of Knowledge:  Fair  Language:  Good  Akathisia:  No  Handed:  Right  AIMS (if indicated):     Assets:  Leisure Time Physical Health Resilience  ADL's:  Intact  Cognition:  WNL  Sleep:        Treatment Plan Summary: Daily contact with patient to assess and evaluate symptoms and progress in treatment, Medication management and Plan schizophrenia, paranoid type:  -Crisis stabilization -Medication management:  Started Depakote 500 mg BID for mood stabilization and haldol 5 mg BID with Cogentin 0.5 mg BID for EPS -Individual counseling  Disposition: No evidence of imminent risk to self or others at present.    Waylan Boga, NP 11/30/2015 10:46 AM  Patient seen face-to-face for psychiatric evaluation, chart reviewed and case discussed with the physician extender and developed treatment plan. Reviewed the information documented and agree with the treatment plan. Corena Pilgrim, MD

## 2015-11-30 NOTE — ED Notes (Signed)
Pt. Transferred to [facility] from ED to room 34 after screening for contraband. Report to include Situation, Background, Assessment and Recommendations [facility]. Pt. Oriented to unit including Q15 minute rounds as well as the security cameras for their protection. Patient is alert and oriented, warm and dry in no acute distress. Patient denies SI, HI, and AVH. Pt. Encouraged to let me know if needs arise.

## 2015-11-30 NOTE — ED Notes (Signed)
Pt in room talking to himself.  When I ask why he came into the ED to see us tonight he sts "i need med refill", when ask meds for what pt sts "bipolar I guess."  RN notified.

## 2015-11-30 NOTE — Discharge Instructions (Addendum)
For your ongoing mental health needs, you are advised to follow up with [facility].  If you do not currently have an appointment, new and returning patients are seen at their walk-in clinic.  Walk-in hours are Monday - Friday from 8:00 am - 3:00 pm.  Walk-in patients are seen on a first come, first served basis.  Try to arrive as early as possible for he best chance of being seen the same day:       [facility]      [address]      [place], [place] [address]      [phone]  If you need a provider in the [facility] area, contact [facility]:       [facility]      [address]      [place], [place] [address]       [phone]

## 2015-11-30 NOTE — BH Assessment (Addendum)
[facility] Assessment Progress Note  Per Thedore MinsMojeed Akintayo, MD, this pt does not require psychiatric hospitalization at this time.  [facility].  This has been included in pt's discharge instructions.  Pt's nurse, Dawnaly, has been notified.  Doylene Canninghomas Chong January, MA Triage Specialist [phone]   Addendum:  Per Rudean Hittawnaly, pt [place] [facility] [facility] [place].  Contact information for [facility] has been included in pt's discharge instructions.  Dawnaly has been notified.  Doylene Canninghomas Darnise Montag, MA Triage Specialist [phone]

## 2015-11-30 NOTE — Progress Notes (Addendum)
Pt asleep and did not awaken with CM calling his name.  ED CM left pt [place] in his locker #34

## 2015-11-30 NOTE — ED Notes (Signed)
Patient was restarted on the medications he has been out of for the past couple of months.  He denies thoughts of harm to self or others, but is having auditory hallucinations.  He was discharged to home with prescriptions.  All belongings returned.  He left the unit ambulatory and was escorted to the front lobby.  He was given a bus pass and PART bus money.

## 2015-11-30 NOTE — Progress Notes (Addendum)
Call received from Erik [name], TTS Counselor stating patient would need PART Bus Pass funds to assist with transportation. CSW staffed with Nurse and she stated [place] [facility] [place].   Spoke with Asst. Director of Social Work and he approved of $3 being given to patient for [employer] Bus Pass funds. Money obtained and given to [facility], Nurse.    Erik [name], LCSWA Clinical Social Worker [phone] 4:01 PM

## 2015-11-30 NOTE — ED Notes (Signed)
Pt hyperactive, pacing in room talking to himself

## 2015-11-30 NOTE — [facility] Suicide Risk Assessment (Signed)
Suicide Risk Assessment  Discharge Assessment   [facility]   Principal Problem: Schizophrenia, paranoid type [facility](HCC) Discharge Diagnoses:  Patient Active Problem List   Diagnosis Date Noted  . Schizophrenia, paranoid type (HCC) [F20.0] 03/20/2011    Priority: High  . Homicidal ideation [R45.850] 01/28/2012    Total Time spent with patient: 45 minutes  Musculoskeletal: Strength & Muscle Tone: within normal limits Gait & Station: normal Patient leans: N/A  Psychiatric Specialty Exam: Physical Exam  Constitutional: He is oriented to person, place, and time. He appears well-developed and well-nourished.  HENT:  Head: Normocephalic.  Neck: Normal range of motion.  Respiratory: Effort normal.  Musculoskeletal: Normal range of motion.  Neurological: He is alert and oriented to person, place, and time.  Psychiatric: He has a normal mood and affect. His speech is normal and behavior is normal. Judgment and thought content normal. Cognition and memory are normal.    Review of Systems  Constitutional: Negative.   HENT: Negative.   Eyes: Negative.   Respiratory: Negative.   Cardiovascular: Negative.   Gastrointestinal: Negative.   Genitourinary: Negative.   Musculoskeletal: Negative.   Skin: Negative.   Neurological: Negative.   Endo/Heme/Allergies: Negative.   Psychiatric/Behavioral: Positive for substance abuse.    Blood pressure 101/74, pulse 93, temperature 97.7 F (36.5 C), temperature source Oral, resp. rate 20, height 6\' 2"  (1.88 m), weight 103.4 kg (228 lb), SpO2 97 %.Body mass index is 29.27 kg/m.  General Appearance: Casual  Eye Contact:  Good  Speech:  Normal Rate  Volume:  Normal  Mood:  Euthymic  Affect:  Congruent  Thought Process:  Coherent and Descriptions of Associations: Intact  Orientation:  Full (Time, Place, and Person)  Thought Content:  WDL  Suicidal Thoughts:  No  Homicidal Thoughts:  No  Memory:  Immediate;    Good Recent;   Good Remote;   Good  Judgement:  Fair  Insight:  Fair  Psychomotor Activity:  Normal  Concentration:  Concentration: Good and Attention Span: Good  Recall:  Good  Fund of Knowledge:  Fair  Language:  Good  Akathisia:  No  Handed:  Right  AIMS (if indicated):     Assets:  Leisure Time Physical Health Resilience  ADL's:  Intact  Cognition:  WNL  Sleep:      Mental Status Per Nursing Assessment::   On Admission:   homicidal ideations  Demographic Factors:  Male  Loss Factors: NA  Historical Factors: NA  Risk Reduction Factors:   Sense of responsibility to family, Positive social support and Positive therapeutic relationship  Continued Clinical Symptoms:  None  Cognitive Features That Contribute To Risk:  None    Suicide Risk:  Minimal: No identifiable suicidal ideation.  Patients presenting with no risk factors but with morbid ruminations; may be classified as minimal risk based on the severity of the depressive symptoms    Plan Of Care/Follow-up recommendations:  Activity:  as tolerated Diet:  heart healthy diet  Maythe Deramo, NP 11/30/2015, 10:51 AM

## 2015-11-30 NOTE — ED Triage Notes (Addendum)
Per EMS pt was picked up at a gas station. Pt called EMS complaining of generalized body aches. Pt takes haldol and depakote which he is out of. Pt is hyperactive. Pt has Hx of schizophrenia. Pt denies any alcohol or substance use. Pt also denies SI/HI.

## 2015-11-30 NOTE — BH Assessment (Addendum)
Assessment Note  Erik [name] is an 40 y.o. male. [facility].  Pt reports he has not had his meds in a couple of months, states he has "too much stress and depression and needs to get started back on my meds."  Pt was very irritable and unwilling to answer some questions from TTS assessment.  Pt denied SI but became very frustrated when TTS asked other questions about history related to SI and ultimately refused to answer saying he didn't want to talk about that.  Pt was equally unwilling to discuss HI initially.  When TTS returned to these questions at the end of the assessment, pt was again irritated and said he was going to hurt "whoever bothers me."  Pt denied hallucinations.  Nursing notes report several instances where pt was observed talking to himself and acting like something was on his hands that he was trying to get cleaned off.  TTS did not observe psychosis during assessment.  Pt reports [facility] and [facility]  He reports his main issue is depression and that "everyone I meet is being hatred towards me".  Pt repeated the phrase "everyone I meet is being hatred" multiple times. Pt reports he drinks once a month "when I have money".  [facility] and no other contacts between 2013 and now.  Pt had one other ED visit this month for "body aches."    Diagnosis: schizoaffective disorder  Past Medical History:  Past Medical History:  Diagnosis Date  . Anxiety   . Depression   . Schizoaffective disorder   . Suicidal thoughts     History reviewed. No pertinent surgical history.  Family History: No family history on file.  Social History:  reports that he has been smoking Cigarettes.  He has a 6.00 pack-year smoking history. He has never used smokeless tobacco. He reports that he drinks alcohol. He reports that he does not use drugs.  Additional Social History:  Alcohol / Drug  Use History of alcohol / drug use?: Yes Substance #1 Name of Substance 1: alcohol 1 - Frequency: "Once a month, when I have money"  CIWA: CIWA-Ar BP: 101/74 Pulse Rate: 93 COWS:    Allergies: No Known Allergies  Home Medications:  (Not in a hospital admission)  OB/GYN Status:  No LMP for male patient.  General Assessment Data Location of Assessment: [facility] TTS Assessment: In system Is this a Tele or Face-to-Face Assessment?: Tele Assessment Is this an Initial Assessment or a Re-assessment for this encounter?: Initial Assessment Marital status: Single Living Arrangements: Other (Comment) (motel) Can pt return to current living arrangement?: Yes Admission Status: Voluntary Is patient capable of signing voluntary admission?: Yes Referral Source: Self/Family/Friend Insurance type: medicaid     Crisis Care Plan Living Arrangements: Other (Comment) (motel) [facility] (not sure when last visit) Name of Therapist: none     Risk to self with the past 6 months Suicidal Ideation: No Is patient at risk for suicide?: No What has been your use of drugs/alcohol within the last 12 months?: drinks once a month Family Suicide History: No Recent stressful life event(s): Other (Comment) ("Other people hating on me") Depression: Yes Depression Symptoms:  ("being hatred-everyone I meet")  Risk to Others within the past 6 months Homicidal Ideation: Yes-Currently Present Does patient have any lifetime risk of violence toward others beyond the  six months prior to admission? : Yes (comment) Identified Victim: "whoever bothers me" History of harm to others?: Yes Assessment of Violence:  (pt would not specify) Violent Behavior Description: unknown-pt would not say Does patient have access to weapons?: No Criminal Charges Pending?: No Does patient have a court date: No Is patient on probation?: No  Psychosis Hallucinations: None noted (pt observed talking to self by  ED RN) Delusions: None noted  Mental Status Report Appearance/Hygiene: Disheveled Eye Contact: Good Motor Activity: Agitation Speech: Tangential, Argumentative Level of Consciousness: Alert Mood: Irritable Affect: Irritable Anxiety Level: None Thought Processes: Irrelevant, Tangential Judgement: Unable to Assess Orientation: Person, Place, Time, Situation Obsessive Compulsive Thoughts/Behaviors: Unable to Assess  Cognitive Functioning Concentration: Unable to Assess Memory: Recent Intact, Remote Intact Insight: Fair Impulse Control: Unable to Assess Appetite: Fair Weight Loss:  (unknown) Sleep: Decreased Total Hours of Sleep: 5 Vegetative Symptoms: Unable to Assess  ADLScreening [facility] Assessment Services) Patient's cognitive ability adequate to safely complete daily activities?:  (unknown) Patient able to express need for assistance with ADLs?:  (unknown) Independently performs ADLs?:  (unknown)  Prior Inpatient Therapy Prior Inpatient Therapy: Yes Prior Therapy Dates: 03/2011 [facility] Reason for Treatment: schizoaffective disorder/meds  Prior Outpatient Therapy Prior Outpatient Therapy: Yes Prior Therapy Dates: unknown Prior Therapy Facilty/Provider(s): [facility] Reason for Treatment: meds Does patient have an ACCT team?: Unknown Does patient have Intensive In-House Services?  : Unknown Does patient have [facility] services? : Yes (unknown how recent) Does patient have P4CC services?: No  ADL Screening (condition at time of admission) Patient's cognitive ability adequate to safely complete daily activities?:  (unknown) Patient able to express need for assistance with ADLs?:  (unknown) Independently performs ADLs?:  (unknown)             Advance Directives (For Healthcare) Does Patient Have a Medical Advance Directive?: No    Additional Information 1:1 In Past 12 Months?:  (unknown) CIRT Risk: No Elopement Risk: No Does  patient have medical clearance?: Yes     Disposition: TTS discussed pt with Nira ConnJason Berry, NP, who suggests pt have AM psych eval. Disposition Initial Assessment Completed for this Encounter: Yes  On Site Evaluation by:   Reviewed with Physician:    Lorri FrederickWierda, Brigitte Soderberg Jon 11/30/2015 4:14 AM

## 2015-11-30 NOTE — ED Notes (Signed)
TTS consult via telepsych machine in process at this time.

## 2015-11-30 NOTE — ED Notes (Signed)
Pt aware that a urine sample is needed.  Pt given urinal.  Pt had his hands in his pants, as pt to remove hands he did not.  Writer gave pt a urinal and exited room.  RN notified.

## 2015-11-30 NOTE — ED Notes (Signed)
Pt keeps on mumbling to himself during blood draw.  Pt was also observed moving his hands together as if there's something on his hand that he is working on.  Pt easily redirected with simple commands.

## 2015-11-30 NOTE — ED Provider Notes (Signed)
[facility]-EMERGENCY DEPT Provider Note   CSN: [id number] Arrival date & time: 11/30/15  0200     History   Chief Complaint Chief Complaint  Patient presents with  . Generalized Body Aches  . Agitation    HPI Erik [name] is a 40 y.o. male.  HPI Patient presents to the emergency department with confusion.  The patient is not a good historian and seems to be speaking very abstractly and in a disorganized fashion.  The patient states that he needs his medications, but has not been on them for several months and is unsure what they are.  Patient states he is not having any suicidal thoughts at this time.  Patient does seem very anxious and agitated Past Medical History:  Diagnosis Date  . Anxiety   . Depression   . Schizoaffective disorder   . Suicidal thoughts     Patient Active Problem List   Diagnosis Date Noted  . Homicidal ideation 01/28/2012  . 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 No pertinent surgical history.     Home Medications    Prior to Admission medications   Medication Sig Start Date End Date Taking? Authorizing Provider  diphenhydrAMINE (BENADRYL) 25 MG tablet Take 1 tablet (25 mg total) by mouth every 6 (six) hours. Patient not taking: Reported on 11/06/2015 02/07/12   Marlon Peliffany Greene, PA-C  ibuprofen (ADVIL,MOTRIN) 400 MG tablet Take 1 tablet (400 mg total) by mouth every 6 (six) hours as needed. 11/06/15   Daryl F de Villier II, PA  naproxen (NAPROSYN) 375 MG tablet Take 1 tablet (375 mg total) by mouth 2 (two) times daily. Patient not taking: Reported on 11/06/2015 02/07/12   Marlon Peliffany Greene, PA-C    Family History No family history on file.  Social History Social History  Substance Use Topics  . Smoking status: Current Every Day Smoker    Packs/day: 0.50    Years: 12.00    Types: Cigarettes  . Smokeless tobacco: Never Used  . Alcohol use Yes     Comment: social/rare     Allergies   Patient has no known  allergies.   Review of Systems Review of Systems Level V caveat applies due to psychosis   Physical Exam Updated Vital Signs BP 101/74 (BP Location: Left Arm)   Pulse 93   Temp 97.7 F (36.5 C) (Oral)   Resp 20   Ht 6\' 2"  (1.88 m)   Wt 103.4 kg   SpO2 97%   BMI 29.27 kg/m   Physical Exam  Constitutional: He is oriented to person, place, and time. He appears well-developed and well-nourished. No distress.  HENT:  Head: Normocephalic and atraumatic.  Mouth/Throat: Oropharynx is clear and moist.  Eyes: Pupils are equal, round, and reactive to light.  Neck: Normal range of motion. Neck supple.  Cardiovascular: Normal rate, regular rhythm and normal heart sounds.  Exam reveals no gallop and no friction rub.   No murmur heard. Pulmonary/Chest: Effort normal and breath sounds normal. No respiratory distress. He has no wheezes.  Abdominal: Soft. Bowel sounds are normal. He exhibits no distension. There is no tenderness.  Neurological: He is alert and oriented to person, place, and time. He exhibits normal muscle tone. Coordination normal.  Skin: Skin is warm and dry. No rash noted. No erythema.  Psychiatric: His mood appears anxious. His speech is rapid and/or pressured and tangential. He is agitated. Thought content is delusional. He expresses no homicidal and no suicidal ideation. He expresses no  suicidal plans and no homicidal plans.  Nursing note and vitals reviewed.    ED Treatments / Results  Labs (all labs ordered are listed, but only abnormal results are displayed) Labs Reviewed  RAPID URINE DRUG SCREEN, HOSP PERFORMED  BASIC METABOLIC PANEL  ETHANOL  CBC WITH DIFFERENTIAL/PLATELET    EKG  EKG Interpretation None       Radiology No results found.  Procedures Procedures (including critical care time)  Medications Ordered in ED Medications  acetaminophen (TYLENOL) tablet 650 mg (not administered)  ibuprofen (ADVIL,MOTRIN) tablet 600 mg (not administered)   zolpidem (AMBIEN) tablet 5 mg (not administered)  ondansetron (ZOFRAN) tablet 4 mg (not administered)  alum & mag hydroxide-simeth (MAALOX/MYLANTA) 200-200-20 MG/5ML suspension 30 mL (not administered)     Initial Impression / Assessment and Plan / ED Course  I have reviewed the triage vital signs and the nursing notes.  Pertinent labs & imaging results that were available during my care of the patient were reviewed by me and considered in my medical decision making (see chart for details).  Clinical Course     Patient will need TTS assessment for further evaluation of his schizophrenia and psychosis  Final Clinical Impressions(s) / ED Diagnoses   Final diagnoses:  None    New Prescriptions New Prescriptions   No medications on file     Charlestine NightChristopher Lupie Sawa, PA-C 11/30/15 0540    Gilda Creasehristopher J Pollina, MD 11/30/15 [phone]

## 2015-11-30 NOTE — ED Notes (Signed)
Bed: [id number] Expected date:  Expected time:  Means of arrival:  Comments: EMS body aches

## 2017-10-12 ENCOUNTER — Emergency Department
Admit: 2017-10-12 | Discharge: 2017-10-12 | Disposition: A | Discharge status: Home / Self Care | Payer: No Typology Code available for payment source

## 2017-10-12 ENCOUNTER — Emergency Department
Admit: 2017-10-12 | Disposition: A | Discharge status: Home / Self Care | Source: Ambulatory Visit | Attending: Ophthalmology | Admitting: Ophthalmology

## 2017-10-12 ENCOUNTER — Ambulatory Visit: Admitting: Emergency Medicine

## 2017-10-12 LAB — HX TOXICOLOGY-DRUG,URINE
HX AMPHETAMINE: NOT DETECTED
HX BARBITUATES: NOT DETECTED
HX BENZODIAZEPINE: NOT DETECTED
HX BUPRENORPHINE, URINE: NOT DETECTED
HX CANNABINOIDS: NOT DETECTED
HX COCAINE: NOT DETECTED
HX FENTANYL, URINE: NOT DETECTED
HX METHADONE: NOT DETECTED
HX OPIATES: NOT DETECTED
HX OXYCODONE: NOT DETECTED
HX U ETHANOL: NOT DETECTED

## 2017-10-12 LAB — HX CHEM-PANELS
HX ANION GAP: 6 (ref 3–14)
HX BLOOD UREA NITROGEN: 17 mg/dL (ref 6–24)
HX CHLORIDE (CL): 110 meq/L (ref 98–110)
HX CO2: 25 meq/L (ref 20–30)
HX CREATININE (CR): 0.79 mg/dL (ref 0.57–1.30)
HX GFR, AFRICAN AMERICAN: 126 mL/min/{1.73_m2}
HX GFR, NON-AFRICAN AMERICAN: 109 mL/min/{1.73_m2}
HX GLUCOSE: 124 mg/dL (ref 70–139)
HX POTASSIUM (K): 3.8 meq/L (ref 3.6–5.1)
HX SODIUM (NA): 141 meq/L (ref 135–145)

## 2017-10-12 LAB — HX DIABETES: HX GLUCOSE: 124 mg/dL (ref 70–139)

## 2017-10-12 LAB — HX HEM-ROUTINE
HX HCT: 43.5 % (ref 37.0–47.0)
HX HGB: 14.5 g/dL (ref 13.5–16.0)
HX MCH: 30.5 pg (ref 26.0–34.0)
HX MCHC: 33.3 g/dL (ref 32.0–36.0)
HX MCV: 91.6 fL (ref 80.0–98.0)
HX MPV: 10.1 fL (ref 9.1–11.7)
HX NRBC #: 0 10*3/uL
HX NUCLEATED RBC: 0 %
HX PLT: 187 10*3/uL (ref 150–400)
HX RBC BLOOD COUNT: 4.75 M/uL (ref 4.20–5.50)
HX RDW: 13.3 % (ref 11.5–14.5)
HX WBC: 5.6 10*3/uL (ref 4.0–11.0)

## 2017-10-12 LAB — HX TOXICOLOGY-DRUG, SERUM
HX ACETAMINOPHEN: <3 ug/mL — ABNORMAL LOW (ref 10–20)
HX BARBITUATES QL, SERUM: NOT DETECTED
HX BENZODIAZEPINES QL, SERUM: NOT DETECTED
HX ETHANOL: <10 mg/dL
HX SALICYLATE: <5 mg/dL — ABNORMAL LOW (ref 15.0–29.9)
HX TCA: NOT DETECTED

## 2017-10-12 NOTE — ED Provider Notes (Signed)
Marland Horne  Name: John Horne, John Horne  MRN: [id number]  Age: 42 yrs  Sex: Male  DOB: 1975-01-04  Arrival Date: 10/12/2017  Arrival Time: 00:07  Account#: [id number]  .  Working Diagnosis:  - Unspecified psychotic disorder,  Alcohol abuse  PCP:  .  HPI:  10/10  01:33 This 42 yrs old Black Male presents to ER via EMS with          cf9        complaints of Psych Problem.  57:65 42 year old male with unknown past psychiatric history presents cf9        with the desire to obtain psychiatric help. He reports that he        has thoughts of hurting others. He has no plan and is unable to        specify to me who these people are. He has no thoughts of        hurting himself. He is not currently on any psychiatric        medications but has been in the past and stopped taking them        about a year ago. He has no other complaints.  .  Historical:  - Allergies: No known drug Allergies;  - Home Meds: pt not sure what meds;  - PMHx: Bipolar disorder; Schizophrenia;  - Social history: Smoking status: Patient uses tobacco    products, current every day smoker. Patient uses alcohol on a    daily basis. Patient/guardian denies using street drugs, IV    drugs.  .  .  ROS:  01:33 Constitutional: Negative for body aches, chills, fatigue.       cf9  01:33 Cardiovascular: Negative for chest pain, palpitations.  01:33 Respiratory: Negative for cough, shortness of breath.  01:33 Abdomen/GI: Negative for abdominal pain, nausea, vomiting,        diarrhea.  01:33 GU: Negative for urinary symptoms.  01:33 Skin: Negative for rash.  01:33 Neuro: Negative for dizziness, gait disturbance.  01:33 Psych: Positive for homicidal ideation, Negative for suicidal        ideation.  .  Vital Signs:  00:09 BP 108 / 80; Pulse 90; Resp 18; Temp 36.4; Pulse Ox 97% on R/A; lp7  05:41 BP 118 / 76; Pulse 85; Resp 18; Pulse Ox 98% on R/A;            lp7  13:59 BP 101 / 66; Pulse 74; Resp 16 Spontaneous; Pulse Ox 98% on R/A;sd17  16:32                                                                  cc9  16:32 pt declined                                                     cc9  .  Glasgow Coma Score:  .  Name:Horne, John  [id number]  [id number]  Page 1 of 5  %%PAGE  .  Name: Horne, John  MRN: [id number]  Age: 65 yrs  Sex: Male  DOB: [birth year]  Arrival Date:  10/12/2017  Arrival Time: 00:07  Account#: [id number]  .  Working Diagnosis:  - Unspecified psychotic disorder,  Alcohol abuse  PCP:  .  00:09 Eye Response: spontaneous(4). Verbal Response: oriented(5).     lp7        Motor Response: obeys commands(6). Total: 15.  00:28 Eye Response: spontaneous(4). Verbal Response: oriented(5).     lp7        Motor Response: obeys commands(6). Total: 15.  13:59 Eye Response: spontaneous(4). Verbal Response: oriented(5).     sd17        Motor Response: obeys commands(6). Total: 15.  .  Exam:  01:33 Constitutional: The patient appears non-diaphoretic, non-toxic, cf9        obese, unkempt.  01:33 Cardiovascular:  Regular rate and rhythm with a normal S1 and   cf9        S2.  No gallops, murmurs, or rubs.  Normal PMI, no JVD.  No        pulse deficits. Abdomen/GI:  Soft, non-tender, with normal        bowel sounds.  No distension or tympany.  No guarding or        rebound.  No evidence of tenderness throughout.  01:33 Psych: Behavior/mood is pleasant, cooperative, Affect is calm,        Oriented to person, place, time, Patient having thoughts of        homicide. Denies plan. Homicidal thoughts directed towards        unclear who these people are Judgement / Insight is impaired.        Delusions/hallucinations unclear.  .  MDM:  01:33 Differential diagnosis: Bipolar - Depressed depression,         cf9        Exacerbation of Schizophrenia Homelessness Mood disorder.  01:33 A consult was requested from: Psychiatry.                       cf9  03:10 ED course: 42 yo M with unknown past psychiatric history        cf9        presents with thoughts of hurting others and requesting        inpatient  psychiatric help. He has no plan to hurt anyone or        himself. Vitals are stable. Labs are unremarkable. Both serum        and urine tox are negative. Psych consulted and will evaluate.        Data reviewed: lab test result(s).  04:30 ED course: Psychiatry will re-evaluate patient in the morning.  cf9  07:05 ED course: Patient slept comfortably overnight without issues.  cf9        Psychiatry will evaluate him this morning and dispo is pending        their recs. Signout was given to [employer], PA-C. My        portion of the medical record is complete.  07:07 Resident chart complete and electronically signed: Wyvonna Plum, MD.  [id number] ED course: This patient was signed out to me at [facility] pending     jrc        psych recommendations. Psych will come and re-evaluate the        patient in the AM. Patient has no required any medications        overnight. Patient is currently resting comfortably. Marland Horne  Marland Horne  Name:Horne, John  [id number]  [id number]  Page 2 of 5  %%PAGE  .  Name: Horne, John  MRN: [id number]  Age: 74 yrs  Sex: Male  DOB: 1975-07-08  Arrival Date: 10/12/2017  Arrival Time: 00:07  Account#: [id number]  .  Working Diagnosis:  - Unspecified psychotic disorder,  Alcohol abuse  PCP:  .  14:20 ED course: Patient is pending CSU placement by the best team.   jrc        Patient has continued to rest comfortably in the ED. Patient        has not required any medication intervention. .  .  10/10  01:52 Order name: BUN (Blood Urea Nitrogen); Complete Time: 03:08     jcp1  10/10  01:52 Order name: CBC/No Diff (With Plt); Complete Time: 03:08        jcp1  10/10  01:52 Order name: CR (Creatinine); Complete Time: 03:08               jcp1  10/10  01:52 Order name: GLU (Glucose); Complete Time: 03:08                 jcp1  10/10  01:52 Order name: LYTES (Na, K, Cl, Co2); Complete Time: 03:08        jcp1  10/10  01:52 Order name: Tox Screen (Serum); Complete Time: 03:08             jcp1  10/10  01:33 Order name: Consult Orders-Psychiatry, [place] (Psychiatry);      cf9        Complete Time: 03:26  10/10  01:52 Order name: Tox Screen (Urine); Complete Time: 03:08            jcp1  10/10  01:52 Order name: Urine Dip (POC); Complete Time: 02:31               jcp1  10/10  01:53 Order name: Comptroller; Complete Time: 01:59                        jcp1  10/10  02:53 Order name: GFR, AA; Complete Time: 03:08                       dispa  t  10/10  02:53 Order name: GFR, NAA; Complete Time: 03:08                      dispa  t  .  Attending Notes:  04:21 Attestation: Assessment and care plan reviewed with             jcp1/  jt26        resident/midlevel provider. See their note for details.        Resident's history reviewed, patient interviewed and examined.        Attending HPI: Social History: HPI: 42 y/o homeless male pt w        PMHx bipolar disorder, schizophrenia presents via EMS for        evaluation of HI and requesting psychiatric help. Pt himself        called 911 from the streets because he did not want to hurt        anyone. He states he has graduated from [employer] 10 times in his        past lives. Admits to smoking daily, drinking 5-6 beers a day.  Denies other illicit drug use. Attending ROS Constitutional: No        fever Eyes: No blurry vision ENT: No sore throat Neck: No neck  .  Name:Burmaster, Cardale  [id number]  [id number]  Page 3 of 5  %%PAGE  .  Name: Jaimen, Melone  MRN: [id number]  Age: 39 yrs  Sex: Male  DOB: 01/22/1975  Arrival Date: 10/12/2017  Arrival Time: 00:07  Account#: [id number]  .  Working Diagnosis:  - Unspecified psychotic disorder,  Alcohol abuse  PCP:  .        pain Cardiovascular: No chest pain Respiratory: No shortness of        breath, no cough Abdomen/GI: No abdominal pain, no nausea or        vomiting Back: No back pain GU: No dysuria or hematuria        MS/Extremity: No extremity pain Psych: Positive for homicidal        ideation. Attending Exam: My personal  exam reveals        Constitutional: Well-nourished, well-developed, NAD. Eyes: No        conjunctival injection, symmetrical eyelids. Neck: Symmetric,        trachea midline. Respiratory: Unlabored respiratory effort.        Musculoskeletal: Normocephalic, atraumatic, extremities w/o        deformity. Skin: warm, dry, no rashes or lesion. Psych: admits        to HI, denies SI, alert and oriented x3. I have reviewed the        Nurses Notes.  06:46 Transition of care: After a discussion of the patient's case,   jcp1/  jt26        care is transferred to Dr. Zachery Conch Pending:        Evaluation/Recommendations by consultant from: Psychiatry        Re-examination and evaluation of the Patient. ED Course: Pt        presents via EMS for evaluation of HI. Psychiatry has evaluated        the pt. They will re evaluate the pt in the morning. Pt has        been resting here comfortably over several hours. Pt is still        awaiting re evaluation by psych at this time. Scribe Scribe        Chart Complete I, Adria Devon , Scribe, personally scribed the        services as above for Dr. Lorine Bears on 10/12/2017.  07:16 Transition of care: Care assumed from Dr. Vernona Rieger. ED Course:     fdf        Medically clr by earlier staff. Awaiting completion of eval by        Psych vis-a-vis his claim of HI with h/o schizophrenia and        bipolar. Dispo as per Psych. ED Course: schizophrenia.        Attending chart complete and electronically signed: Sharrell Ku.        Zachery Conch MD, MS, Armando Gang [phone].  15:21 Transition of care: After a discussion of the patient's case,   fdf        care is transferred to Dr. Eulah Pont.  .  Disposition Summary:  10/12/17 16:14  Discharge Ordered        Location: To Shelter  jrc        Problem: new                                                    jrc        Symptoms: have improved                                         jrc        Condition: Stable                                                jrc        Diagnosis          - Unspecified psychotic disorder                              jrc          - Alcohol abuse                                               jrc        Followup:                                                       jrc  .  Name:Egnor, Trayven  [id number]  [id number]  Page 4 of 5  %%PAGE  .  Name: Lanard, Arguijo  MRN: [id number]  Age: 76 yrs  Sex: Male  DOB: Oct 29, 1975  Arrival Date: 10/12/2017  Arrival Time: 00:07  Account#: [id number]  .  Working Diagnosis:  - Unspecified psychotic disorder,  Alcohol abuse  PCP:  .          - With:          - When:          - Reason: Continuance of care        Discharge Instructions:          - Discharge Summary Sheet                                     jrc          - DRUG ABUSE, General                                         jrc          - ALCOHOL ABUSE  jrc        Forms:          - Medication Reconciliation Form                              jrc  Signatures:  Lorraine Lax                      MD   fdf  Dispatcher, [employer]                          dispa  Esperanza Sheets                         MD   msm  Juleen China                     MD   Hiram Comber                          BSN  lp7  Lorine Bears                           DO   jcp1  Moshe Salisbury                         Reg  st16  Adria Devon, Scribe                      Scribjt26  Thomasena Edis, Victorino Dike                       PA-C jrc  .  Document is preliminary until electronically or manually signed by the atte  nding physician  .  .  .  .  .  .  .  .  .  .  .  .  .  .  .  .  .  .  .  .  .  .  Name:Bonnin, Miken  [id number]  [id number]  Page 5 of 5  .  %%END

## 2017-10-12 NOTE — ED Provider Notes (Signed)
[name]  Name: John Horne, John Horne  MRN: [id number]  Age: 42 yrs  Sex: Male  DOB: Oct 20, 1975  Arrival Date: 10/12/2017  Arrival Time: 00:07  Account#: [id number]  Bed HA5  PCP:  Chief Complaint: Psych Problem  .  Presentation:  10/10  00:07 Presenting complaint: EMS states: Pt brought in from the        lp7        street. Pt called 911 because he was having thoughts and did        not want to hurt anyone. Pt reports thoughts of HO. pt reports        hx of bipolar pt reports he has been off his meds.  00:07 Method Of Arrival: EMS: [facility]                              lp7  00:07 Acuity: Adult 2                                                 lp7  .  Historical:  - Allergies:  00:09 No known drug Allergies;                                        lp7  - Home Meds:  00:09 pt not sure what meds [Active];                                 lp7  - PMHx:  00:09 Bipolar disorder; Schizophrenia;                                lp7  .  - Social history: Smoking status: Patient uses tobacco    products, current every day smoker. Patient uses alcohol on a    daily basis. Patient/guardian denies using street drugs, IV    drugs.  .  .  Screening:  00:09 SEPSIS SCREENING - Temp > 38.3 or < 36.0 No - Heart Rate > 90   lp7        No - Respiratory > 20 No - SBP < 90 No Does this patient have a        suspected source of infection at this timequestion No SIRS Criteria (>        = 2) No.  .  Vital Signs:  00:09 BP 108 / 80; Pulse 90; Resp 18; Temp 36.4; Pulse Ox 97% on R/A; lp7  05:41 BP 118 / 76; Pulse 85; Resp 18; Pulse Ox 98% on R/A;            lp7  13:59 BP 101 / 66; Pulse 74; Resp 16 Spontaneous; Pulse Ox 98% on R/A;sd17  16:32                                                                 cc9  16:32 pt declined                                                     cc9  .  Glasgow Coma Score:  00:09 Eye Response: spontaneous(4). Verbal Response: oriented(5).     lp7        Motor Response: obeys commands(6). Total: 15.  00:28 Eye Response:  spontaneous(4). Verbal Response: oriented(5).     lp7        Motor Response: obeys commands(6). Total: 15.  13:59 Eye Response: spontaneous(4). Verbal Response: oriented(5).     sd17  .  Name:John Horne, John Horne  [id number]  [id number]  Page 1 of 3  %%PAGE  .  Name: John Horne, John Horne  MRN: [id number]  Age: 53 yrs  Sex: Male  DOB: 21-Feb-1975  Arrival Date: 10/12/2017  Arrival Time: 00:07  Account#: [id number]  Bed HA5  PCP:  Chief Complaint: Psych Problem  .        Motor Response: obeys commands(6). Total: 15.  .  Triage Assessment:  00:09 General: Appears comfortable, Behavior is cooperative.          lp7        Respiratory: Airway is patent. Skin: Skin is normal.  .  Assessment:  00:28 Reassessment: pt enters my care. pt calm and cooperative. Pt    lp7        changed into hospital gown. Pt mumbling to himself during RN        assessment. General: Appears comfortable, unkempt, Behavior is        cooperative. Neuro: Eye opening: Spontaneously Level on        consciousness: Sustained Attention Verbal Response:        Orientation:. Cardiovascular: Capillary refill < 3 seconds.        Respiratory: Airway is patent Respiratory effort is even,        unlabored. Skin: Skin is pink, warm / dry.  02:36 Reassessment: physiatry at bedside for eval.                    lp7  03:35 Reassessment: pt sleeping comfortably at this time will         lp7        continue to monitor.  05:59 Reassessment: pt VSS. pt continues to rest comfortably on       lp7        stretcher pt offered PO fluids and crackers.  .  Observations:  00:07 Patient arrived in ED.                                          lp7  00:08 Triage Completed.                                               lp7  00:55 Patient Visited By: Wyvonna Plum  00:59 Registration completed.  st16  00:59 Patient Visited By: Moshe Salisbury                             st16  01:51 Patient Visited By: Lorine Bears                                jcp1  07:16 Patient Visited By: Lorraine Lax                          fdf  15:40 Patient Visited By: Tommie Sams  .  Procedure:  02:07 BUN (Blood Urea Nitrogen) Sent.                                 lp7  02:07 CBC/No Diff (With Plt) Sent.                                    lp7  02:07 Tox Screen (Serum) Sent.                                        lp7  02:07 LYTES (Na, K, Cl, Co2) Sent.                                    lp7  02:07 GLU (Glucose) Sent.                                             lp7  02:07 CR (Creatinine) Sent.                                           lp7  .  Interventions:  00:09 Patient placed on stretcher.                                    lp7  01:10 Demo Sheet Scanned into Chart                                   sp22  .  Name:John Horne, John Horne  [id number]  [id number]  Page 2 of 3  %%PAGE  .  Name: John Horne, John Horne  MRN: [id number]  Age: 50 yrs  Sex: Male  DOB: Jan 03, 1976  Arrival Date: 10/12/2017  Arrival Time: 00:07  Account#: [id number]  Bed HA5  PCP:  Chief Complaint: Psych Problem  .  01:19 EMS Sheet Scanned into Chart  sp22  02:32 Urine DipResults: Specific Gravity 1.020 Ph- 6 Leukocytes       lm24        Negative. Nitrites- Negative. Protein Negative Glucose- Normal.        Ketones Trace. Urobilinogen ++ Bilirubin + Blood- Negative.  .  Outcome:  16:14 Discharge ordered by MD.                                        Chauncy Passy  16:31 Discharged to via taxi voucher to [facility]. Condition: stable. cc9        Discharge instructions given to patient, Instructed on        discharge instructions, follow up and referral plans.        Demonstrated understanding of instructions, medications. Chart        Status Nursing note complete and electronically signed.  16:56 Patient left the ED.                                            cc9  .  Signatures:  Lorraine Lax                      MD   fdf  Joyce Gross                       BSN  cc9  Esperanza Sheets                         MD   msm  Juleen China                     MD   Hester Mates, Lauren                          BSN  lp7  Popso, Justin                           DO   jcp1  Gillis Santa                           Sec  sp22  Moshe Salisbury                         Reg  st16  Martin Majestic                               [address] [facility]                       PA-C jrc  [place], [place]                       CCT  lm24  .  .  .  .  .  .  .  .  .  .  .  .  .  .  .  .  .  Name:John Horne, John Horne  [id number]  [id number]  Page 3 of 3  .  %%END

## 2017-11-04 ENCOUNTER — Emergency Department
Admit: 2017-11-04 | Discharge: 2017-11-05 | Disposition: A | Discharge status: Home / Self Care | Payer: No Typology Code available for payment source

## 2017-11-04 ENCOUNTER — Ambulatory Visit: Admitting: Emergency Medicine

## 2017-11-04 ENCOUNTER — Emergency Department
Admit: 2017-11-04 | Disposition: A | Discharge status: Home / Self Care | Source: Ambulatory Visit | Attending: Emergency Medicine | Admitting: Emergency Medicine

## 2017-11-04 LAB — HX CHEM-METABOLIC: HX THYROID STIMULATING HORMONE (TSH): 1.64 u[IU]/mL (ref 0.35–4.94)

## 2017-11-04 LAB — HX HEM-ROUTINE
HX BASO #: 0 10*3/uL (ref 0.0–0.2)
HX BASO: 1 %
HX EOSIN #: 0.3 10*3/uL (ref 0.0–0.5)
HX EOSIN: 4 %
HX HCT: 43.3 % (ref 37.0–47.0)
HX HGB: 14.9 g/dL (ref 13.5–16.0)
HX IMMATURE GRANULOCYTE#: 0 10*3/uL (ref 0.0–0.1)
HX IMMATURE GRANULOCYTE: 0 %
HX LYMPH #: 2.4 10*3/uL (ref 1.0–4.0)
HX LYMPH: 32 %
HX MCH: 30.6 pg (ref 26.0–34.0)
HX MCHC: 34.4 g/dL (ref 32.0–36.0)
HX MCV: 88.9 fL (ref 80.0–98.0)
HX MONO #: 0.7 10*3/uL (ref 0.2–0.8)
HX MONO: 9 %
HX MPV: 9.9 fL (ref 9.1–11.7)
HX NEUT #: 4.1 10*3/uL (ref 1.5–7.5)
HX NRBC #: 0 10*3/uL
HX NUCLEATED RBC: 0 %
HX PLT: 206 10*3/uL (ref 150–400)
HX RBC BLOOD COUNT: 4.87 M/uL (ref 4.20–5.50)
HX RDW: 13.2 % (ref 11.5–14.5)
HX SEG NEUT: 55 %
HX WBC: 7.6 10*3/uL (ref 4.0–11.0)

## 2017-11-04 LAB — HX TOXICOLOGY-DRUG, SERUM
HX ACETAMINOPHEN: <3 ug/mL — ABNORMAL LOW (ref 10–20)
HX BARBITUATES QL, SERUM: NOT DETECTED
HX BENZODIAZEPINES QL, SERUM: NOT DETECTED
HX ETHANOL: 39 mg/dL — ABNORMAL HIGH
HX SALICYLATE: <5 mg/dL — ABNORMAL LOW (ref 15.0–29.9)
HX TCA: NOT DETECTED

## 2017-11-04 LAB — HX CHEM-PANELS
HX ANION GAP: 12 (ref 3–14)
HX BLOOD UREA NITROGEN: 14 mg/dL (ref 6–24)
HX CHLORIDE (CL): 105 meq/L (ref 98–110)
HX CO2: 23 meq/L (ref 20–30)
HX CREATININE (CR): 0.9 mg/dL (ref 0.57–1.30)
HX GFR, AFRICAN AMERICAN: 119 mL/min/{1.73_m2}
HX GFR, NON-AFRICAN AMERICAN: 103 mL/min/{1.73_m2}
HX GLUCOSE: 113 mg/dL (ref 70–139)
HX POTASSIUM (K): 3.9 meq/L (ref 3.6–5.1)
HX SODIUM (NA): 140 meq/L (ref 135–145)

## 2017-11-04 LAB — HX DIABETES: HX GLUCOSE: 113 mg/dL (ref 70–139)

## 2017-11-04 NOTE — ED Provider Notes (Signed)
[name]  Name: John Horne, John Horne  MRN: [id number]  Age: 42 yrs  Sex: Male  DOB: [birth year]  Arrival Date: 11/04/2017  Arrival Time: 21:16  Account#: [id number]  .  Working Diagnosis: Alcohol abuse,  Adjustment disorder with anxiety  PCP: No Pcp, Per, PATIENT  .  HPI:  11/02  21:44 This 42 yrs old Black Male presents to ER via EMS with          am47        complaints of Anxiety, Psych Problem.  21:44 Patient is a 42 year old male with PMH bipolar disorder and     am47        schizophrenia who presents via EMS with complaints of "aching        and pain in his arms and feet which is causing the whole body        to hurt". Patient informs me he has been outside for two days        waiting for the train to [facility], [place]. He informs me he is        trying to go back home and has been for 1 month. Patient admits        to drinking 7 beers today but denies substance use. Patient        denies HI, SI or any hallucinations. According to EMS patient        reported feeling anxious and unsafe and trying to get back to        [place]. He informs me he has been compliant with his        medications.  .  Historical:  - Allergies: No known drug Allergies;  - Home Meds: pt denies ;  - PMHx: Bipolar disorder; Schizophrenia;  - Social history: Smoking status: Patient uses tobacco    products, current every day smoker. Patient uses alcohol    Marijuana Patient/guardian denies using street drugs, IV    drugs, homeless.  - Family history: No immediate family members are acutely ill.  - The history from nurses notes was reviewed: and I agree with    what is documented.  .  .  ROS:  21:51 Constitutional: Negative for fever, chills, and weight loss,    am47        Eyes: Negative for injury, pain, redness, and discharge, ENT:        Negative for injury, pain, and discharge, Cardiovascular:        Negative for chest pain, palpitations, and edema, Respiratory:        Negative for shortness of breath, cough, wheezing, and        pleuritic chest  pain, Abdomen/GI: Negative for abdominal pain,        nausea, vomiting, diarrhea, and constipation, MS/Extremity:        Negative for injury and deformity, Skin: Negative for injury,        rash, and discoloration, Neuro: Negative for headache,        weakness, numbness, tingling, and seizure, Psych: Negative for        depression, anxiety, suicide ideation, homicidal ideation, and        hallucinations, Allergy/Immunology: Negative for hives, rash,  .  Name:John Horne, John Horne  [id number]  [id number]  Page 1 of 8  %%PAGE  .  Name: John Horne, John Horne  MRN: [id number]  Age: 74 yrs  Sex: Male  DOB: Mar 08, 1975  Arrival Date: 11/04/2017  Arrival Time: 21:16  Account#: [id number]  .  Working Diagnosis: Alcohol abuse,  Adjustment disorder with anxiety  PCP: No Pcp, Per, PATIENT  .        and allergies, Endocrine: Negative for neck swelling,        polydipsia, polyuria, polyphagia, and marked weight changes,        Hematologic/Lymphatic: Negative for swollen nodes, abnormal        bleeding, and unusual bruising.  .  Vital Signs:  21:21 BP 101 / 63; Pulse 132; Resp 14; Pulse Ox 96% on R/A; Pain 0/10;jm42  21:28 Pulse 124; Resp 14; Temp 37.2; Pulse Ox 96% on R/A; Pain 0/10;  cb8  22:26 Pulse 110; Resp 16; Pulse Ox 95% on R/A;                        cb8  22:55 BP 109 / 69; Pulse 115; Resp 16; Pulse Ox 96% on R/A; Pain 0/10;cb8  11/03  00:03 BP 103 / 59 Left Arm Supine (auto/reg); Pulse 111; Resp 16;     bm1        Pulse Ox 97% on R/A;  05:01 BP 112 / 74; Pulse 88; Resp 16;                                 mm10  11/02  21:21 refused temperature assessment.                                 jm42  .  Glasgow Coma Score:  21:21 Eye Response: spontaneous(4). Verbal Response: oriented(5).     jm42        Motor Response: obeys commands(6). Total: 15.  21:52 Eye Response: spontaneous(4). Verbal Response: oriented(5).     am47        Motor Response: obeys commands(6). Total: 15.  11/03  04:27 Eye Response: spontaneous(4). Verbal Response:  oriented(5).     bm1        Motor Response: obeys commands(6). Total: 15.  05:07 Eye Response: spontaneous(4). Verbal Response: oriented(5).     bm1        Motor Response: obeys commands(6). Total: 15.  .  Exam:  11/02  21:52 Constitutional: The patient appears well nourished, alert, well am47        developed, awake, unkempt, strong body odor smell        Head/face: Normocephalic/atraumatic.        Eyes: Pupils: equal, round, and reactive to light. Extraocular        movements: intact throughout.        ENT: Normal external ears and nose.        Neck: Supple.        Respiratory: clear to auscultation bilaterally with no        increased work of breathing.        Cardiovascular: Rate: tachycardic, Rhythm: regular, Pulses: no        pulse deficits are appreciated, Edema: is not appreciated.        Abdomen/GI: Soft, non-tender, nondistended. No palpable masses.  .  Name:John Horne, John Horne  [id number]  [id number]  Page 2 of 8  %%PAGE  .  Name: John Horne, John Horne  MRN: [id number]  Age: 8 yrs  Sex: Male  DOB: 1975-02-13  Arrival Date: 11/04/2017  Arrival Time: 21:16  Account#: [id number]  .  Working Diagnosis: Alcohol abuse,  Adjustment  disorder with anxiety  PCP: No Pcp, Per, PATIENT  .        No obvious injury.        Back: Exam negative for CVA tenderness.        Musculoskeletal/extremity: Atraumatic x 4.        Neuro:        Psych: Behavior/mood is pleasant, cooperative, Affect is calm,        Oriented to person, place, time, Patient has no        thoughts/intents to harm self or others. Patient responding to        some internal stimuli and having delusional thoughts. .        Skin: Appearance: Color: pink, Temperature: warm, Moisture: dry.  .  MDM:  22:05 ED course: Patient is a 42 year old male with hx schizophrenia  am47        and bipolar disorder who presents with EMS with complains of        pain in his hands and feet that causes pain throughout his        body. He informs me he has been waiting for the train for  2        days to return to [place]. Per EMS report patient        complained of anxiety and "not feeling safe". Pt having        delusional thoughts stating he attending [employer], [employer] and        another college all within the last month and responding to an        internal stimuli. He is afebrile and tachycardic. Physical exam        was unremarkable. EKG showed sinus tachycardia but not ST        elevation or depression. Pt currently refusing lab work. .  23:07 ED course: Pt care transferred to [facility] PA with plan to     am47        continue current treatment regimen.. My portion of the ED chart        is complete / electronically signed: Myrla Halsted, PA.  11/03  00:18 Data reviewed: nurses notes, old medical records, In [place] In abc        [employer].  .  11/02  21:23 Order name: Wyvonnia Dusky; Complete Time: 21:42             dispa  t  11/02  21:44 Order name: BUN (Blood Urea Nitrogen); Complete Time: 01:00     am47  11/02  21:44 Order name: CBC/Diff (With Plt); Complete Time: 01:00           am47  11/02  21:44 Order name: CR (Creatinine); Complete Time: 01:00               am47  11/02  21:44 Order name: GLU (Glucose); Complete Time: 01:00                 am47  11/02  21:44 Order name: LYTES (Na, K, Cl, Co2); Complete Time: 01:00        am47  11/02  .  Name:John Horne, John Horne  [id number]  [id number]  Page 3 of 8  %%PAGE  .  Name: John Horne, John Horne  MRN: [id number]  Age: 2 yrs  Sex: Male  DOB: July 20, 1975  Arrival Date: 11/04/2017  Arrival Time: 21:16  Account#: [id number]  .  Working Diagnosis: Alcohol abuse,  Adjustment disorder with anxiety  PCP: No  Pcp, Per, PATIENT  .  21:43 Order name: Adult EKG (order using folder); Complete Time: 21:43am47  11/02  21:43 Order name: EKG (order using folder); Complete Time: 21:53      am47  11/02  21:44 Order name: Tox Screen (Urine); Complete Time: 04:25            am47  11/02  21:44 Order name: Tox Screen (Serum); Complete Time: 01:00             am47  11/02  22:24 Order name: ADD TSH Reflex Test; Complete Time: 22:32           abc  11/02  22:24 Order name: Monitor; Complete Time: 22:59                       abc  11/02  22:24 Order name: Please page social work for Consult: AMS; Complete  abc        Time: 00:25  11/02  22:24 Order name: Fall Precautions; Complete Time: 22:58              abc  11/02  22:24 Order name: NPO (Until After Dysphagia Screen); Complete Time:  abc        22:58  11/02  22:24 Order name: Neuro Checks q 2 hours; Complete Time: 22:38        abc  11/02  22:24 Order name: Pulse Oximetry Continuous; Complete Time: 22:58     abc  11/02  22:24 Order name: Refer to ED-OBS; Complete Time: 22:58               abc  11/02  22:24 Order name: Vital signs q 2 hours; Complete Time: 22:59         abc  11/02  23:58 Order name: Vitals (Recheck); Complete Time: 00:02              abc  11/03  00:36 Order name: Consult Orders-Psychiatry, [place] (Psychiatry);      pb5        Complete Time: 01:35  .  Attending Notes:  00:18 Attestation: Assessment and care plan reviewed with             abc        resident/midlevel provider. See their note for details.        Physician Assistant's history reviewed, patient interviewed and        examined. Attending HPI: HPI: This is a 42 year old male with a        history of schizophrenia and bipolar disorder, who presents for        evaluation of altered mental status. On my history, the patient        is uncooperative and refusing to answer most questions. The        thumb he denies any pain in his extremities he currently has a  .  Name:John Horne, John Horne  [id number]  [id number]  Page 4 of 8  %%PAGE  .  Name: John Horne, John Horne  MRN: [id number]  Age: 39 yrs  Sex: Male  DOB: 1975/11/01  Arrival Date: 11/04/2017  Arrival Time: 21:16  Account#: [id number]  .  Working Diagnosis: Alcohol abuse,  Adjustment disorder with anxiety  PCP: No Pcp, Per, PATIENT  .        recollection of trauma denies headache or abdominal pain. He         does report that he is feeling anxious" I don't feel safe.".  Attending Exam: My personal exam reveals Constitutional and        sleeping, but arousable, but will not open eyes. There was no        evidence of trauma. Upon nonvoluntary eye open because        patient's 4 mm equal reactive pupils. ENT: Mucosa is moist.        Neck appears supple. Skin appears to have no rash, the patient        refuses to fully disrobe for further examination. Lungs are        clear and equal bilaterally. Heart r regular rhythm,        tachycardia noted, no murmur. Abdomen soft and nontender.        Extremities without any tenderness the teeth, there is some dry        skin to the lower 70s without edema. Neuro sleeping, but        arousable, however, he remains with a GCS of 14, without        voluntary eye opening, moves all extremities. Psychologic he is        pressured and disorganized speech. I have reviewed the Nurses        Notes, Old Records in: [place]. [employer]. Transition of care:        After a discussion of the patient's case, care is transferred        to Dr. Metro Kung. ED Course: This is a 42 year old male who        presents with altered mental status. Differential diagnoses        includes metabolic disorder, substance use disorder with acute        toxidrome, schizophrenia. His persistent tachycardia is        concerning although does improve without intervention the        emergency room. EKG was obtained visualized by me interpreted        by me as a sinus tachycardia, no widening of the QRS, no ST        elevation. Labs were obtained and interpreted by me as        documented above and are normal with the exception of an        alcohol of 39. His tachycardia improved. We'll continue to        observe and reassess. Attending chart complete and        electronically signed: Jari Pigg B. Colangelo MD, FACEP, ext.        [id number].  01:00 Transition of care: Care assumed from Dr. Sheryn Bison.             mm10/  jk30  04:59 ED Course: Patient is now awake, alert, oriented. Is asking to  mm10/  jk30        go home. Patient denies any SI or plan. He has no other medical        complaints and is asking for ginger ale. He was discharged with        instructions to follow up with his PCP for further continuance        of care. My Working Impression: anxiety; alcohol abuse;        schizophrenia.  05:01 Scribe Scribe Chart Complete By signing my name below, Barbara Cower    mm10/  [employer], attest that this documentation has been prepared under the        direction and in the  presence of Dr. Metro Kung, D.O.        Electronically signed by Toribio Harbour, ED Scribe, 11/05/2017.  [name]  Name:John Horne, John Horne  [id number]  [id number]  Page 5 of 8  %%PAGE  .  Name: John Horne, John Horne  MRN: [id number]  Age: 36 yrs  Sex: Male  DOB: 12-10-1975  Arrival Date: 11/04/2017  Arrival Time: 21:16  Account#: [id number]  .  Working Diagnosis: Alcohol abuse,  Adjustment disorder with anxiety  PCP: No Pcp, Per, PATIENT  .  ED Observation:  11/02  22:24 REFER PATIENT TO ED OBSERVATION STATUS. The patient was         abc        informed of the need for further observational care. Diagnosis:        Acute Mental Status Change. Reason for ED Observation: At the        time of referral to ED Observation status, a more precise        diagnosis is needed and further observation and testing is        required for diagnostic accuracy. TREATMENT PLAN: Continuous        Pulse ox and airway monitoring. Serial Neurologic Exams. Family        history The patient is unable to give a family history, due to        medical condition.  22:34 Progress Note: Discussed with patient the need for lab work due am47        to tachycardia and his changing mental status. Patient again        refused.  22:39 Progress Note: patient does not have capacity to refuse lab     abc        draw and is presenting with altered mental status and        tachycardia.  11/03  01:55 Progress Note:  Labs with ethanol 39. Patient sleeping. Wakes to pb5        voice. Mumbles but not cooperative with further questioning.        Psych consulted. Recommendations pending.  .  Disposition Summary:  11/05/17 05:00  Discharge Ordered        Location: Home -                                                mm10        Problem: new(11/05/17 05:00)                                    mm10        Symptoms: have improved(11/05/17 05:00)                         mm10        Condition: Stable(11/05/17 05:00)                               mm10        Diagnosis          - Alcohol abuse  mm10          - Adjustment disorder with anxiety                            mm10        Followup:                                                       mm10          - With: Private Physician          - When: Next week          - Reason: Continuance of care        Discharge Instructions:          - Discharge Summary Sheet                                     mm10          - ADJUSTMENT DISORDER                                         mm10          - ALCOHOL ABUSE                                               mm10          - SCHIZOPHRENIA, General                                      mm10        Forms:  .  Name:John Horne, John Horne  [id number]  [id number]  Page 6 of 8  %%PAGE  .  Name: John Horne, John Horne  MRN: [id number]  Age: 19 yrs  Sex: Male  DOB: [birth year]  Arrival Date: 11/04/2017  Arrival Time: 21:16  Account#: [id number]  .  Working Diagnosis: Alcohol abuse,  Adjustment disorder with anxiety  PCP: No Pcp, Per, PATIENT  .          - Medication Reconciliation Form                              mm10  Signatures:  Dispatcher, [employer]                          dispa  Mostofi, Matt                           DO   mm10  Konrad Penta                     MD   abc  Jonathon Jordan  BSN  cb8  Shellia Cleverly                         RN   jm42  Ellard Artis                        Sec   jm49  [place], Cochran                        [place]   pb5  [place], Peri                               pa4  [place], Tierney                             tc14  [place], Djinnie                            dt6  Toribio Harbour, Scribe                       Gracey, Amy                           [place] OV56  .  Corrections: (The following items were deleted from the chart)  11/02  21:48 21:44 Patient is a 42 year old male with PMH bipolar disorder   am47        and schizophrenia who presents via EMS with complaints of        "aching and pain in his arms and feet which is causing the        whole body to hurt". Patient informs me he has been outside for        two days waiting for the train to [facility]. He informs me        he is trying to go back home and has been for 1 month. Patient        admits to drinking 7 beers today but denies substance use.        Patient denies HI, SI or any hallucinations. According to EMS        patient reported feeling anxious and unsafe and trying to get        back to [place]. [name] am47  22:08 21:44 Patient is a 42 year old male with PMH bipolar disorder   am47        and schizophrenia who presents via EMS with complaints of        "aching and pain in his arms and feet which is causing the        whole body to hurt". Patient informs me he has been outside for        two days waiting for the train to [facility]. He informs me        he is trying to go back home and has been for 1 month. Patient        admits to drinking 7 beers today but denies substance use.        Patient denies HI, SI or any hallucinations. According to EMS        patient reported feeling anxious and unsafe and trying to get  back to [place]. He informs me he has been compliant        with his medications. am47  22:09 21:52 Psych: Behavior/mood is pleasant, cooperative, Affect is  am47        calm, Oriented to person, place, time, Patient has no        thoughts/intents to harm self or  others. Patient responding to        some internal stimuli but denies hallucinations. [name] am47  .  Name:Plata, John Horne  [id number]  [id number]  Page 7 of 8  %%PAGE  .  Name: John Horne, John Horne  MRN: [id number]  Age: 78 yrs  Sex: Male  DOB: Aug 04, 1975  Arrival Date: 11/04/2017  Arrival Time: 21:16  Account#: [id number]  .  Working Diagnosis: Alcohol abuse,  Adjustment disorder with anxiety  PCP: No Pcp, Per, PATIENT  .  22:27 22:25 abc                                                       jm42  22:41 22:40 TSH Reflex (Result-based TT3 and/or FT4)+Chemistry        dispa  t        ordered. dispat  22:50 22:34 Progress Note: discussed with patient the need for lab    am47        work due to tachycardia and his changing mental status. Patient        again refused am47  11/03  04:58 11/02 22:25 Observation abc                                     mm10  11/03  04:58 11/02 22:25 John Horne, John Horne abc                             mm10  11/03  04:58 11/02 22:25 ED Obsv abc                                         mm10  11/03  04:58 11/02 22:25 Stable abc                                          mm10  11/03  04:58 11/02 22:25 new abc                                             mm10  11/03  04:58 11/02 22:25 have improved abc                                   mm10  11/03  04:58 11/02 22:25 Regular abc  mm10  11/03  04:58 11/02 22:25 Altered mental status, unspecified abc              mm10  11/03  04:58 11/02 22:27 ED Obs jm42                                         mm10  11/03  05:01 04:59 My Working Impression: suicidal ideation mm10/jk30        mm10/  jk30  .  Document is preliminary until electronically or manually signed by the atte  nding physician  .  .  .  .  .  .  .  .  .  .  .  .  .  .  .  Name:Botkin, Leory  [id number]  [id number]  Page 8 of 8  .  %%END

## 2017-11-04 NOTE — ED Provider Notes (Signed)
.  .  Name: John, Horne  MRN: [id number]  Age: 42 yrs  Sex: Male  DOB: 09-May-1975  Arrival Date: 11/04/2017  Arrival Time: 21:16  Account#: [id number]  Bed HA6  PCP: No Pcp, Per, PATIENT  Chief Complaint: Anxiety, Psych Problem  .  Presentation:  11/02  21:17 Presenting complaint: EMS states: EMS called to [facility]        for a man feeling unwell, upon EMS arrival pt found seated and        noted to be alert. Pt reports feelings of anxiety and "doesn't        feel safe". Pt appears delusional per EMS as he intermittently        talked about needing to go back to college in [place].        Per EMS pt appears nervous and gives limited information to        questions. Pt denies SI, HI, or any hallucinations. Pt reports        currently being non-compliant with psychiatric medications. Pt        denies recent EtOH or drug use.  21:17 Method Of Arrival: EMS: [facility]                              jm42  21:17 Acuity: Adult 3                                                 jm42  .  Historical:  - Allergies:  21:21 No known drug Allergies;                                        jm42  - Home Meds:  21:21 pt denies [Active];                                             jm42  - PMHx:  21:21 Bipolar disorder; Schizophrenia;                                jm42  .  - Social history: Smoking status: Patient uses tobacco    products, current every day smoker. Patient uses alcohol    Marijuana Patient/guardian denies using street drugs, IV    drugs, homeless.  - Family history: No immediate family members are acutely ill.  - The history from nurses notes was reviewed: and I agree with    what is documented.  .  .  Screening:  21:21 Fall Risk None identified. Exposure Risk/Travel Screening: No.  jm42        The patient reports that they have not travelled outside out of        the [place] in the past 30 days.  .  Vital Signs:  21:21 BP 101 / 63; Pulse 132; Resp 14; Pulse Ox 96% on R/A; Pain 0/10;jm42  21:28 Pulse 124;  Resp 14; Temp 37.2; Pulse Ox 96% on R/A; Pain 0/10;  cb8  22:26 Pulse 110; Resp  16; Pulse Ox 95% on R/A;                        cb8  22:55 BP 109 / 69; Pulse 115; Resp 16; Pulse Ox 96% on R/A; Pain 0/10;cb8  11/03  .  Name:Pester, Marky  [id number]  [id number]  Page 1 of 5  %%PAGE  .  Name: John, Horne  MRN: [id number]  Age: 9 yrs  Sex: Male  DOB: 10-22-1975  Arrival Date: 11/04/2017  Arrival Time: 21:16  Account#: [id number]  Bed HA6  PCP: No Pcp, Per, PATIENT  Chief Complaint: Anxiety, Psych Problem  .  00:03 BP 103 / 59 Left Arm Supine (auto/reg); Pulse 111; Resp 16;     bm1        Pulse Ox 97% on R/A;  05:01 BP 112 / 74; Pulse 88; Resp 16;                                 mm10  11/02  21:21 refused temperature assessment.                                 jm42  .  Glasgow Coma Score:  21:21 Eye Response: spontaneous(4). Verbal Response: oriented(5).     jm42        Motor Response: obeys commands(6). Total: 15.  21:52 Eye Response: spontaneous(4). Verbal Response: oriented(5).     am47        Motor Response: obeys commands(6). Total: 15.  11/03  04:27 Eye Response: spontaneous(4). Verbal Response: oriented(5).     bm1        Motor Response: obeys commands(6). Total: 15.  05:07 Eye Response: spontaneous(4). Verbal Response: oriented(5).     bm1        Motor Response: obeys commands(6). Total: 15.  .  Triage Assessment:  11/02  21:21 General: Appears comfortable, well nourished, well groomed,     jm42        Behavior is flat. Pain: Denies pain. Neuro: No deficits noted.        Cardiovascular: Capillary refill < 3 seconds. Respiratory:        Airway is patent Respiratory effort is even, unlabored,        Respiratory pattern is regular, symmetrical. GI: Abdomen is        flat, non- distended. Skin: Skin is intact, is healthy with        good turgor, Skin is pink, warm / dry.  .  Assessment:  21:28 Reassessment: In by EMS. Pt disheveled. Arrives with belongings cb8        in large garbage bag. When asked why he  was here, Pt states        "something to due with stress, I can't explain". Pt states he        lives in [place] but unable to provide address. Denies        using drugs tonight, states, "Just some beer". General: Appears        unkempt. Neuro: No deficits noted.  21:56 Reassessment: Pt refuses blood work. States "I told them no     cb8        blood". Encouraged to have labs drawn to help with treatment,        Pt states "No, I said no".  22:27  Reassessment: Sleeping with cover over head.                    cb8  22:55 Reassessment: Allows this nurse to draw blood. IV placed right  cb8        AC. On bedside pulse ox/BP. General: Appears in no apparent        distress. Neuro: No deficits noted. Eye opening: Spontaneously        Level on consciousness: Sustained Attention Speech: Clear.  .  Name:[name], [name]  [id number]  [id number]  Page 2 of 5  %%PAGE  .  Name: John, Horne  MRN: [id number]  Age: 30 yrs  Sex: Male  DOB: 1975-01-18  Arrival Date: 11/04/2017  Arrival Time: 21:16  Account#: [id number]  Bed HA6  PCP: No Pcp, Per, PATIENT  Chief Complaint: Anxiety, Psych Problem  .        Facial Droop: Appears Normal Motor Response: Obeys Commands        Motor Strength: Right Arm: Strong Left Arm: Strong Right Leg:        Strong Left Leg: Strong Right Grasp Strong Left Grasp Strong.        Respiratory: No deficits noted.  23:15 General: Appears in no apparent distress, comfortable, sleeping bm1        with blanket over his head. Respiratory: No deficits noted.        Airway is patent Respiratory effort is even, unlabored,        Respiratory pattern is regular, symmetrical, O2 Sat continuous        monitoring. Skin: No deficits noted.  11/03  00:44 General: Appears in no apparent distress, comfortable, sleeping bm1        on stretcher with blanket over his head. Respiratory: No        deficits noted. Airway is patent Respiratory effort is even,        unlabored, Respiratory pattern is regular, symmetrical.  Skin:        No deficits noted.  02:41 Reassessment: will continue to monitor pt. General: Appears in  bm1        no apparent distress, comfortable, sleeping. Respiratory: No        deficits noted. Skin: No deficits noted.  04:27 General: Appears in no apparent distress, comfortable, unkempt, bm1        Behavior is cooperative. Pain: Denies pain. Neuro: No deficits        noted. Respiratory: No deficits noted. Airway is patent        Respiratory effort is even, unlabored, Respiratory pattern is        regular, symmetrical. Skin: No deficits noted.  05:07 General: Appears in no apparent distress, comfortable, Behavior bm1        is cooperative. Pain: Denies pain. Neuro: No deficits noted.        Respiratory: No deficits noted. Airway is patent Respiratory        effort is even, unlabored, Respiratory pattern is regular,        symmetrical. Skin: No deficits noted.  .  Observations:  11/02  21:16 Patient arrived in ED.                                          jm42  21:20 Triage Completed.  jm42  21:24 Patient Visited By: Jonathon Jordan                           cb8  21:25 Patient Visited By: Joannie Springs  21:28 Patient Visited By: Jonathon Jordan                           cb8  21:32 Registration completed.                                         dt6  21:32 Patient Visited By: Michel Harrow                           dt6  22:22 Patient Visited By: Konrad Penta                         abc  22:26 Patient Visited By: Jonathon Jordan                           cb8  22:27 Patient assigned to [facility]                                         jm42  11/03  .  Name:Galas, Gilmer  [id number]  [id number]  Page 3 of 5  %%PAGE  .  Name: Jayvan, Mcshan  MRN: [id number]  Age: 64 yrs  Sex: Male  DOB: 12-Jan-1975  Arrival Date: 11/04/2017  Arrival Time: 21:16  Account#: [id number]  Bed HA6  PCP: No Pcp, Per, PATIENT  Chief Complaint: Anxiety,  Psych Problem  .  00:48 Patient Visited By: Miachel Roux                               mm10  .  Procedure:  11/02  21:52 EKG done. (by ED staff). No Old EKG Reviewed By: Willaim Rayas        MD.  22:55 Labs drawn. (by ED staff). Sent per order to lab. Inserted      cb8        peripheral IV: 22 gauge in right antecubital area and blood        (including any ordered blood cultures) drawn.  11/03  02:03 Tox Screen (Urine) Sent.                                        pp8  04:05 Discontinued lock intact, bleeding controlled, No               bm1        redness/swelling at site. pt removed his own hep lock.  .  Interventions:  11/02  21:52 ECG/EKG Scanned into Chart  tc14  22:02 Demo Sheet Scanned into Chart                                   tc14  22:23 EMS Sheet Scanned into Chart                                    dw  22:55 Pulse ox on. NIBP on.                                           cb8  23:15 Report received from Caroline,RN.                               bm1  11/03  00:14 Urine DipResults: Specific Gravity 1.015 Ph- 5 Leukocytes       bm1        Negative. Nitrites- Negative. Protein Negative Glucose- +++        Ketones Negative. Urobilinogen Negative Bilirubin Negative        Blood- Negative.  02:52 Downtime Chart Scanned into Chart                               jm49  05:08 pt given gingerale, grahams,Charlie card.                       bm1  .  Outcome:  11/02  22:25 Decision to Hospitalize by Provider.                            abc  11/03  04:58 Hospitalize undone.                                             mm10  05:00 Discharge ordered by MD.                                        mm10  05:07 Discharged to home ambulatory. Condition: stable. Pain: Denies  bm1        pain. Discharge instructions given to patient, Instructed on        discharge instructions, follow up and referral plans.        Demonstrated understanding of instructions, Medication list:        Not  applicable (no meds). Discharge Assessment: Patient awake,        alert and oriented x 3. No cognitive and/or functional deficits        noted. Patient verbalized understanding of disposition  .  Name:Maund, Dimitri  [id number]  [id number]  Page 4 of 5  %%PAGE  .  Name: Akbar, Sacra  MRN: [id number]  Age: 27 yrs  Sex: Male  DOB: [birth year]  Arrival Date: 11/04/2017  Arrival Time: 21:16  Account#: [id number]  Bed HA6  PCP: No Pcp, Per, PATIENT  Chief Complaint: Anxiety, Psych Problem  .  instructions. Patient awake and alert. Oriented to person,        place and time. Patient verbalized understanding of disposition        instructions. Patient has no functional deficits. Chart Status        Nursing note complete and electronically signed.  05:09 Patient left the ED.                                            bm1  .  Signatures:  [place] Charon, Delta                                  dw  [place], Baldwin                           DO   mm10  Rudell Cobb                        RN   bm1  Konrad Penta                     MD   abc  Jonathon Jordan                       BSN  cb8  Shellia Cleverly                         RN   jm42  Ellard Artis                        Sec  jm49  [place], Charles                     CCT  ch18  [place], Parvathy                        CCT  pp8  Boaz, Tierney                             tc14  [place], Djinnie                            dt6  [place], Amy                           [place] ZO10  .  .  .  .  .  .  .  .  .  .  .  .  .  .  .  .  .  .  .  .  .  .  .  .  Name:Ryder, Jeanmarc  [id number]  [id number]  Page 5 of 5  .  %%END

## 2017-11-05 LAB — HX TOXICOLOGY-DRUG,URINE
HX AMPHETAMINE: NOT DETECTED
HX BARBITUATES: NOT DETECTED
HX BENZODIAZEPINE: NOT DETECTED
HX BUPRENORPHINE, URINE: NOT DETECTED
HX CANNABINOIDS: NOT DETECTED
HX COCAINE: NOT DETECTED
HX FENTANYL, URINE: NOT DETECTED
HX METHADONE: NOT DETECTED
HX OPIATES: NOT DETECTED
HX OXYCODONE: NOT DETECTED
HX U ETHANOL: NOT DETECTED

## 2017-11-10 ENCOUNTER — Emergency Department
Admit: 2017-11-10 | Discharge: 2017-11-10 | Disposition: A | Discharge status: Home / Self Care | Payer: No Typology Code available for payment source

## 2017-11-10 ENCOUNTER — Emergency Department
Admit: 2017-11-10 | Disposition: A | Discharge status: Home / Self Care | Source: Ambulatory Visit | Attending: Emergency Medicine | Admitting: Emergency Medicine

## 2017-11-10 ENCOUNTER — Ambulatory Visit: Admitting: Emergency Medicine

## 2017-11-10 NOTE — ED Provider Notes (Signed)
[name]  Name: John Horne, John Horne  MRN: [id number]  Age: 42 yrs  Sex: Male  DOB: 02-23-1975  Arrival Date: 11/10/2017  Arrival Time: 02:43  Account#: [id number]  .  Working Diagnosis: Pain, unspecified  PCP:  .  Historical:  - Allergies: No known Allergies;  - Home Meds: Pt Denies;  - PMHx: Bipolar disorder; Schizophrenia;  - PSHx: None;  - Social history: Smoking status: Patient uses tobacco    products, current every day smoker. Patient uses alcohol on a    daily basis. The patient lives in a shelter.  .  .  Vital Signs:  11/08  02:44 BP 122 / 78; Pulse 88; Resp 16; Temp 36.3(O); Pulse Ox 99% on   ag33        R/A; Pain 10/10;  03:25 BP 114 / 71; Pulse 63; Resp 14; Pulse Ox 96% on R/A;            kf12  .  Glasgow Coma Score:  02:44 Eye Response: spontaneous(4). Verbal Response: oriented(5).     ag33        Motor Response: obeys commands(6). Total: 15.  03:15 Eye Response: spontaneous(4). Verbal Response: oriented(5).     kf12        Motor Response: obeys commands(6). Total: 15.  .  MDM:  .  11/08  02:46 Order name: PATIENTPING STORY                                   dispa  t  .  Dispensed Medications:  03:25 Drug: Motrin 400 mg Route: PO;                                  kf12  .  [name]  Attending Notes:  03:15 Attending HPI: Social History: Smoker Homeless Drinks alcohol   jcp1/  jt24        Family History: No one sick at home HPI: 26 yom with PMHx of        bipolar disorder and schizophrenia presents for evaluation of        body aches x 15 hours. Pt walked into the ED with a steady        gait. He states he took Excedrin for Sx management. No other        physical complaints reported. Attending ROS Eyes: No blurry        vision ENT: No sore throat Neck: No neck pain Cardiovascular:        No chest pain Respiratory: No shortness of breath, no cough        Abdomen/GI: No abdominal pain, no nausea or vomiting GU: No        dysuria or hematuria Skin: No rash Neuro: No headache  .  Name:[name],  [name]  [id number]  [id number]  Page 1 of 3  %%PAGE  .  Name: Horne, John  MRN: [id number]  Age: 78 yrs  Sex: Male  DOB: [birth year]  Arrival Date: 11/10/2017  Arrival Time: 02:43  Account#: [id number]  .  Working Diagnosis: Pain, unspecified  PCP:  .        Constitutional: Positive for body aches. Attending Exam: My        personal exam reveals Constitutional: Well-nourished,        well-developed, appears stated age. Eyes: No conjunctival  injection, symmetrical eyelids. Neck: Symmetric, trachea        midline. Respiratory: Unlabored respiratory effort.        Musculoskeletal: NC/AT, extremities without deformity. Skin:        Warm, dry, no rashes or lesion. Neuro: Normal speech, MAE,        steady gait. Psych: Awake, alert, and oriented. Appropriate        mood and affect. I have reviewed the Nurses Notes, and I agree.  03:17 ED Course: 68 yom with PMHx of bipolar disorder and             jcp1/  jt24        schizophrenia presents for evaluation of body aches. He was        given Motrin in the ED. Pt was discharged with instructions to        return to the ED if Sx persist or worsen. He was agreeable to        and understood the plan with all questions answered. My Working        Impression: pain, unspecified. Scribe Scribe Chart Complete I,        Ezekiel Slocumb, Scribe, personally scribed the services as        above for Dr. Lorine Bears on 11/10/2017.  [name]  Disposition Summary:  11/10/17 03:14  Discharge Ordered        Location: Home -                                                jcp1        Problem: new                                                    jcp1        Symptoms: have improved                                         jcp1        Condition: Stable                                               jcp1        Diagnosis          - Pain, unspecified                                           jcp1        Followup:                                                       jcp1          - With:  Private  Physician          - When: As needed          - Reason: Continuance of care        Discharge Instructions:          - Discharge Summary Sheet                                     jcp1          - PAIN, Uncertain Cause (Acute)                               jcp1        Forms:          - Medication Reconciliation Form                              jcp1  Signatures:  Ocie Bob                      RN   ag33  Lorine Bears                           DO   jcp1  [facility]                           jt24  Wynonia Sours                           Reg  db17  Eben Burow                          RN   [phone]  .  Name:Horne, John  [id number]  [id number]  Page 2 of 3  %%PAGE  .  Name: Horne, John  MRN: [id number]  Age: 30 yrs  Sex: Male  DOB: 14-Sep-1975  Arrival Date: 11/10/2017  Arrival Time: 02:43  Account#: [id number]  .  Working Diagnosis: Pain, unspecified  PCP:  .  [name]  Document is preliminary until electronically or manually signed by the atte  nding physician  .  .  .  .  .  .  .  .  .  .  .  .  .  .  .  .  .  .  .  .  .  .  .  .  .  .  .  .  .  .  .  .  .  .  .  .  .  .  .  .  .  Name:Horne, John  [id number]  [id number]  Page 3 of 3  .  %%END

## 2017-11-10 NOTE — ED Provider Notes (Signed)
.  .  Name: Tory, Septer  MRN: [id number]  Age: 42 yrs  Sex: Male  DOB: Nov 01, 1975  Arrival Date: 11/10/2017  Arrival Time: 02:43  Account#: [id number]  Bed HA5  PCP:  Chief Complaint:  - body ache  .  Presentation:  11/08  02:43 Presenting complaint: EMS states: patient found on the side     ag33        walk ambulatory with steady gait,reporting body pain since noon.  02:43 Method Of Arrival: EMS: [facility]                              [phone]  02:43 Acuity: Adult 3                                                 ag33  .  Historical:  - Allergies:  02:44 No known Allergies;                                             ag33  - Home Meds:  02:44 Pt Denies [Active];                                             ag33  - PMHx:  02:44 Bipolar disorder; Schizophrenia;                                ag33  - PSHx:  02:44 None;                                                           ag33  .  - Social history: Smoking status: Patient uses tobacco    products, current every day smoker. Patient uses alcohol on a    daily basis. The patient lives in a shelter.  .  .  Screening:  02:44 SEPSIS SCREENING - Temp > 38.3 or < 36.0 No - Heart Rate > 90   ag33        No - Respiratory > 20 No - SBP < 90 No Does this patient have a        suspected source of infection at this timequestion No SIRS Criteria (>        = 2) No. Safety screen: Patient feels safe. Fall Risk None        identified. Exposure Risk/Travel Screening: No. The patient        reports that they have not travelled outside out of the [place] in        the past 30 days.  .  Vital Signs:  02:44 BP 122 / 78; Pulse 88; Resp 16; Temp 36.3(O); Pulse Ox 99% on   ag33        R/A; Pain 10/10;  03:25 BP 114 / 71; Pulse 63; Resp 14; Pulse Ox 96%  on R/A;            kf12  .  Glasgow Coma Score:  02:44 Eye Response: spontaneous(4). Verbal Response: oriented(5).     ag33        Motor Response: obeys commands(6). Total: 15.  03:15 Eye Response: spontaneous(4). Verbal Response: oriented(5).      kf12        Motor Response: obeys commands(6). Total: 15.  .  .  Name:Pfefferkorn, Irie  [id number]  [id number]  Page 1 of 3  %%PAGE  .  Name: Ruddy, Swire  MRN: [id number]  Age: 53 yrs  Sex: Male  DOB: 04/11/1975  Arrival Date: 11/10/2017  Arrival Time: 02:43  Account#: [id number]  Bed HA5  PCP:  Chief Complaint:  - body ache  .  Triage Assessment:  02:44 General: Appears in no apparent distress, unkempt, Behavior is  ag33        cooperative. Pain: Complains of pain in body pain Pain        currently is 10/10. Neuro: No deficits noted. Respiratory:        Airway is patent Respiratory effort is even, unlabored,        Respiratory pattern is regular, symmetrical.  .  Assessment:  03:15 General: Appears in no apparent distress, Behavior is           kf12        cooperative. Neuro: No deficits noted. Cardiovascular: No        deficits noted. Rhythm is regular. Respiratory: No deficits        noted. Airway is patent Respiratory effort is even, unlabored,        Respiratory pattern is regular. Skin: No deficits noted.  .  Observations:  02:43 Patient arrived in ED.                                          ag33  02:44 Triage Completed.                                               ag33  02:55 Registration completed.                                         db17  02:55 Patient Visited By: Wynonia Sours                               db17  03:10 Patient Visited By: Lorine Bears                               jcp1  .  Dispensed Medications:  03:25 Drug: Motrin 400 mg Route: PO;                                  kf12  .  [name]  Interventions:  02:44 Patient placed on stretcher.  ag33  03:02 Demo Sheet Scanned into Chart                                   jm49  03:09 EMS Sheet Scanned into Chart                                    jm49  .  Outcome:  03:14 Discharge ordered by MD.                                        Kinnie Feil  03:25 Discharged to home ambulatory. Condition: good. Discharge       [phone]         instructions given to patient, Instructed on discharge        instructions, follow up and referral plans. Demonstrated        understanding of instructions, Discharged by MD. Discharge        Assessment: Patient awake, alert and oriented x 3. No cognitive        and/or functional deficits noted. Patient verbalized        understanding of disposition instructions. Chart Status Nursing        note complete and electronically signed.  03:25 Patient left the ED.                                            RU04  .  [name]  Name:Henkels, Ziyad  [id number]  [id number]  Page 2 of 3  %%PAGE  .  Name: Gerron, Guidotti  MRN: [id number]  Age: 26 yrs  Sex: Male  DOB: 03/20/1975  Arrival Date: 11/10/2017  Arrival Time: 02:43  Account#: [id number]  Bed HA5  PCP:  Chief Complaint:  - body ache  .  Signatures:  Ellard Artis                        Sec  jm49  Ocie Bob                      RN   ag33  Lorine Bears                           DO   jcp1  Wynonia Sours                           Reg  db17  Eben Burow                          RN   [phone]  .  .  .  .  .  .  .  .  .  .  .  .  .  .  .  .  .  .  .  .  .  .  .  .  .  .  .  .  .  .  .  .  .  .  .  .  .  Name:Farrior, Fayrene Fearing  [id number]  [id number]  Page 3 of 3  .  %%END

## 2018-01-11 ENCOUNTER — Emergency Department: Admit: 2018-01-11 | Discharge: 2018-01-11 | Discharge status: Against Medical Advice | Payer: 59

## 2018-01-11 ENCOUNTER — Ambulatory Visit: Admitting: Emergency Medicine

## 2018-01-11 ENCOUNTER — Emergency Department: Admit: 2018-01-11 | Discharge status: Against Medical Advice | Source: Ambulatory Visit

## 2018-01-11 NOTE — ED Provider Notes (Signed)
.  .    Name: Cledith, Abdou  MRN: [id number]  Age: 43 yrs  Sex: Male  DOB: Jan 10, 1975  Arrival Date: 01/11/2018  Arrival Time: 14:38  Account#: [id number]  .  Working Diagnosis:  PCP:  .  Historical:  - Allergies: Unable to obtain;  - Home Meds: unable to obtain;  - PMHx: Bipolar disorder; Schizophrenia;  - Social history: Smoking status: Patient uses tobacco    products, current every day smoker.  .  .  Vital Signs:  01/09  14:40 BP 144 / 84; Pulse 84; Resp 16; Temp 36.9(O); Pulse Ox 98% on   mt18        R/A;  .  Glasgow Coma Score:  14:40 Eye Response: spontaneous(4). Verbal Response: confused(4).     mt18        Motor Response: obeys commands(6). Total: 14.  [name]  Disposition Summary:  01/11/18 14:46  Left Against Medical Advice        Location: Home                                                  mt18        Condition: Unchanged                                            mt18  Signatures:  Octavia Heir                         RN   mt18  .  Document is preliminary until electronically or manually signed by the atte  nding physician  .  .  .  .  .  .  .  .  .  .  .  .  .  .  .  .  .  Name:Ellena, Kaysan  [id number]  [id number]  Page 1 of 1  .  %%END

## 2018-01-11 NOTE — ED Provider Notes (Signed)
 .  .  Name: John Horne, John Horne  MRN: [id number]  Age: 43 yrs  Sex: Male  DOB: 1975/11/17  Arrival Date: 01/11/2018  Arrival Time: 14:38  Account#: [id number]  Bed A13  PCP:  Chief Complaint: Abd Pain, Psych Problem  .  Presentation:  01/09  14:39 Presenting complaint: EMS states: abd pain, auditory            mt18        hallucinations. Unknown SI and HI- would not answer questions        for EMS. Denies SI and HI for RN. Developed abd pain after        eating lunch.  14:39 Acuity: Adult 2                                                 mt18  14:39 Method Of Arrival: EMS: [facility]  .  Historical:  - Allergies:  14:40 Unable to obtain;                                               mt18  - Home Meds:  14:40 unable to obtain [Active];                                      mt18  - PMHx:  14:40 Bipolar disorder; Schizophrenia;                                mt18  .  - Social history: Smoking status: Patient uses tobacco    products, current every day smoker.  .  .  Screening:  14:40 SEPSIS SCREENING - Temp > 38.3 or < 36.0 No - Heart Rate > 90   mt18        No - Respiratory > 20 No - SBP < 90 No Does this patient have a        suspected source of infection at this timequestion No SIRS Criteria (>        = 2) No. Safety screen: Unable to do safety screening pt        refuses to answer. Exposure Risk/Travel Screening: No. The        patient reports that they have not travelled outside out of the        [place] in the past 30 days.  14:41 Fall Risk None identified.                                      mt18  .  Vital Signs:  14:40 BP 144 / 84; Pulse 84; Resp 16; Temp 36.9(O); Pulse Ox 98% on   mt18        R/A;  .  Glasgow Coma Score:  14:40 Eye Response: spontaneous(4). Verbal Response: confused(4).     mt18  Motor Response: obeys commands(6). Total: 14.  .  Triage Assessment:  14:40 General: Appears unkempt.                                       mt18  .  [name]  Name:Horne,  John  AID:[id number]  [id number]  Page 1 of 2  %%PAGE  .  Name: John Horne, John Horne  MRN: [id number]  Age: 49 yrs  Sex: Male  DOB: 30-Nov-1975  Arrival Date: 01/11/2018  Arrival Time: 14:38  Account#: [id number]  Bed A13  PCP:  Chief Complaint: Abd Pain, Psych Problem  .  Assessment:  14:43 General: Pt upset about going to tx room. Pt cooperative with   mt18        questions and denies SI and HI. Pt alert. Refuses to see MD        regarding abd pain. .  .  Observations:  14:38 Patient arrived in ED.                                          mt18  14:40 Triage Completed.                                               mt18  .  Interventions:  14:40 Patient placed in exam room on stretcher.                       mt18  .  Outcome:  14:46 Patient left against medical advice.                            mt18  14:46 Patient left the ED.                                            mt18  .  Corrections: (The following items were deleted from the chart)  14:46 14:39 Presenting complaint: EMS states: abd pain, auditory      mt18        hallucinations. Unknown SI and HI- would not answer questions        for EMS/RN. Developed abd pain after eating lunch mt18  .  Signatures:  Octavia Heir                         RN   mt18  .  .  .  .  .  .  .  .  .  .  .  .  .  .  .  .  .  .  .  .  Name:Horne, John  GNP:[id number]  [id number]  Page 2 of 2  .  %%END

## 2018-05-27 ENCOUNTER — Emergency Department: Admit: 2018-05-27 | Discharge: 2018-05-27 | Disposition: A | Discharge status: Home / Self Care | Payer: 59

## 2018-05-27 ENCOUNTER — Emergency Department
Admit: 2018-05-27 | Disposition: A | Discharge status: Home / Self Care | Source: Ambulatory Visit | Attending: Ophthalmology | Admitting: Ophthalmology

## 2018-05-27 ENCOUNTER — Ambulatory Visit: Admitting: Ophthalmology

## 2018-05-27 NOTE — ED Provider Notes (Signed)
 .  .  Name: Giordano, Getman  MRN: [id number]  Age: 43 yrs  Sex: Male  DOB: 25-May-1975  Arrival Date: 05/27/2018  Arrival Time: 02:36  Account#: [id number]  Bed HE1  PCP:  Chief Complaint: Psych Problem  .  Presentation:  05/24  02:36 Presenting complaint: EMS states: pt coming from train          ag33        station,pt told security at the station that he need an        ambulance,reports feeling out of control,feeling like he is        going to beat someone up per ems report,pt hearing        voices,denies any alcohol or drug use,no pain ,pt denies HI/SI.  02:36 Method Of Arrival: EMS: [facility]                              ag33  02:36 Acuity: Adult 3                                                 ag33  .  Historical:  - Allergies:  02:38 No known Allergies;                                             ag33  - Home Meds:  02:38 pt unable to recall medication [Active];                        ag33  - PMHx:  02:38 Bipolar disorder; Schizophrenia; Depressive disorder;           ag33  - PSHx:  02:38 None;                                                           ag33  .  - Social history: Smoking status: Patient uses tobacco    products, current every day smoker. Patient uses alcohol on a    daily basis. Patient/guardian denies using street drugs, The    patient lives alone, No barriers to communication noted.  - Family history: No immediate family members are acutely ill.  - The history from nurses notes was reviewed: and I agree with    what is documented.  .  .  Screening:  02:38 SEPSIS SCREENING - Temp > 38.3 or < 36.0 No - Heart Rate > 90   ag33        No - Respiratory > 20 No - SBP < 90 No Does this patient have a        suspected source of infection at this timequestion No SIRS Criteria (>        = 2) No. Safety screen: Patient feels safe. Fall Risk None        identified. Exposure Risk/Travel Screening: Any travel in last        14 daysquestion No. Fever or Respiratory Symptomsquestion No. Known COVID 19  exposurequestion No. Have you Had COVID Testingquestion Yes. reports tested        negative one month ago for covid 19.  .  Vital Signs:  02:38 BP 122 / 90; Pulse 72; Resp 18; Temp 36.6(O); Pulse Ox 100% on  ag33        R/A; Pain 0/10;  .  Name:Seelig, Marcelino  [id number]:[id number]  [id number]  Page 1 of 3  %%PAGE  .  Name: Da, Authement  MRN: [id number]  Age: 89 yrs  Sex: Male  DOB: 14-Mar-1975  Arrival Date: 05/27/2018  Arrival Time: 02:36  Account#: [id number]  Bed HE1  PCP:  Chief Complaint: Psych Problem  .  [name]  Glasgow Coma Score:  02:38 Eye Response: spontaneous(4). Verbal Response: oriented(5).     ag33        Motor Response: obeys commands(6). Total: 15.  02:47 Eye Response: spontaneous(4). Verbal Response: oriented(5).     bm1        Motor Response: obeys commands(6). Total: 15.  .  Triage Assessment:  02:38 General: Appears in no apparent distress, comfortable, Behavior ag33        is cooperative. Pain: Denies pain. Neuro: Eye opening:        Spontaneously. Respiratory: Airway is patent Respiratory effort        is even, unlabored, Respiratory pattern is regular, symmetrical.  .  Assessment:  02:47 General: Appears in no apparent distress, unkempt, Behavior is  bm1        anxious, cooperative. Pain: Denies pain. Neuro: No deficits        noted. EENT: No deficits noted. Cardiovascular: Chest pain is        denied. Respiratory: Airway is patent Respiratory effort is        even, unlabored, Respiratory pattern is regular, symmetrical,        Denies cough, shortness of breath. GI: No deficits noted. GU:        No deficits noted. Skin: Skin is dry, Skin is normal, Skin        temperature is warm. Musculoskeletal: No deficits noted.  .  Observations:  02:36 Patient arrived in ED.                                          ag33  02:38 Triage Completed.                                               ag33  03:00 Patient Visited By: Teresita Madura                           jrc  03:04 Registration completed.                                          db17  03:04 Patient Visited By: Wynonia Sours                               db17  03:17 Patient Visited By: Lorraine Lax  fdf  .  Interventions:  02:38 Patient placed in exam room on stretcher.                       ag33  02:48 Patient Interventions patient placed on stretcher in view of    bm1        nurse. Armband on Bed in low position. PO fluids given. Verbal        reassurance given. Warm blanket given. pt given sandwich and        gingerale.  03:07 Demo Sheet Scanned into Chart                                   jm49  03:08 EMS Sheet Scanned into Chart                                    jm49  .  Outcome:  03:19 Discharge ordered by MD.                                        fdf  .  Name:Neace, Fayrene Fearing  OXB:[id number]  [id number]  Page 2 of 3  %%PAGE  .  Name: Yanixan, Mellinger  MRN: [id number]  Age: 46 yrs  Sex: Male  DOB: 1975-05-27  Arrival Date: 05/27/2018  Arrival Time: 02:36  Account#: [id number]  Bed HE1  PCP:  Chief Complaint: Psych Problem  .  03:22 Discharged to home ambulatory. Condition: stable. Pain: Denies  bm1        pain. Discharge instructions given to patient, Instructed on:        discharge instructions, follow up and referral plans.        Demonstrated understanding of instructions, Medication list:        Not applicable (no meds). Discharge Assessment: Patient awake,        alert and oriented x 3. No cognitive and/or functional deficits        noted. Patient verbalized understanding of disposition        instructions. Patient awake and alert. Oriented to person,        place and time. Patient verbalized understanding of disposition        instructions. Patient has no functional deficits. Chart Status        Nursing note complete and electronically signed.  03:33 Patient left the ED.                                            bm1  .  Corrections: (The following items were deleted from the chart)  02:40 02:38 Allergies: Unable to obtain; ag33                          ag33  02:40 02:38 PMHx: pt unable to recall medication; ag33                ag33  02:42 02:36 Presenting complaint: EMS states: pt coming from train    ag33        station,pt told security at the station that he need an  ambulance,reports feeling out of control,feeling like he is        going to beat someone up per ems report,pt hearing        voices,denies any alcohol or drug use,no pain ag33  02:44 02:38 BP 122 / 90; Pulse 72bpm; Resp 18bpm; Temp 35.8C Oral;    ag33        Pain 0/10; ag33  .  Signatures:  Lorraine Lax                      MD   fdf  Rudell Cobb                        RN   bm1  Ellard Artis                        Sec  jm49  Ocie Bob                      RN   ag33  Teresita Madura                       PA-C jrc  [place], Debbie                           Reg  [id number]  .  .  .  .  .  .  .  .  .  .  .  .  Name:Mowbray, Kendan  [id number]  [id number]  Page 3 of 3  .  %%END

## 2018-05-27 NOTE — ED Provider Notes (Signed)
 [name]  Name: John Horne, John Horne  MRN: [id number]  Age: 43 yrs  Sex: Male  DOB: [birth year]  Arrival Date: 05/27/2018  Arrival Time: 02:36  Account#: [id number]  .  Working Diagnosis: Schizophrenia, unspecified,  Bipolar disorder, unspecifi  ed,  Homelessness  PCP:  .  Historical:  - Allergies: No known Allergies;  - Home Meds: pt unable to recall medication ;  - PMHx: Bipolar disorder; Schizophrenia; Depressive disorder;  - PSHx: None;  - Social history: Smoking status: Patient uses tobacco    products, current every day smoker. Patient uses alcohol on a    daily basis. Patient/guardian denies using street drugs, The    patient lives alone, No barriers to communication noted.  - Family history: No immediate family members are acutely ill.  - The history from nurses notes was reviewed: and I agree with    what is documented.  .  .  Vital Signs:  05/24  02:38 BP 122 / 90; Pulse 72; Resp 18; Temp 36.6(O); Pulse Ox 100% on  ag33        R/A; Pain 0/10;  .  Glasgow Coma Score:  02:38 Eye Response: spontaneous(4). Verbal Response: oriented(5).     ag33        Motor Response: obeys commands(6). Total: 15.  02:47 Eye Response: spontaneous(4). Verbal Response: oriented(5).     bm1        Motor Response: obeys commands(6). Total: 15.  .  MDM:  03:24 Data reviewed: EMS record, nurses notes, vital signs. Data      fdf        interpreted: Pulse oximetry: Interpretation: normal.        Counseling: I had a detailed discussion with the patient and/or        guardian regarding: the historical points, exam findings, and        any diagnostic results supporting the discharge diagnosis, need        for followup, to return to the emergency department if symptoms        worsen or persist or if there are any questions or concerns        that arise at home, need for discussion and follow up with PCP.        Response to treatment: Improved.  [name]  05/24  02:37 Order name: Wyvonnia Dusky; Complete Time: 02:56             dispa  t  05/24  02:38 Order  name: EDIE_CAREPLAN; Complete Time: 02:56                 dispa  t  .  Attending Notes:  .  Name:Stutz, Daivion  [id number]:[id number]  [id number]  Page 1 of 3  %%PAGE  .  Name: Bader, Stubblefield  MRN: [id number]  Age: 34 yrs  Sex: Male  DOB: April 21, 1975  Arrival Date: 05/27/2018  Arrival Time: 02:36  Account#: [id number]  .  Working Diagnosis: Schizophrenia, unspecified,  Bipolar disorder, unspecifi  ed,  Homelessness  PCP:  .  03:24 Attestation: Assessment and care plan reviewed with             fdf        resident/midlevel provider. See their note for details.        Physician Assistant's history reviewed, patient interviewed and        examined. Attending HPI: HPI: Patient presents by EMS after he        apparently called for help  at the train station, stating that        he had schizophrenia, and felt that he was out of control and        might hurt somebody. Unclear if he has had this problem before.        Here, he admits to a history of schizophrenia and bipolar        disorder, says he is on medication and the care of a        psychiatrist. Unclear when he last required psychiatric        hospitalization. He denies any attempt to harm himself or        others. No complaints of any recent medical illness and denies        cough, fevers, myalgias, GI symptoms. Denies substance use        disorder. Unclear what makes the symptoms better or worse. He        acknowledges that he is homeless and currently barred from the        shelters, and was looking for a place to stay. Attending Exam:        My personal exam reveals Patient is awake and alert and appears        in no acute distress. He is calm. Vital signs are normal.        Psych: Cooperative, poor eye contact, very slightly pressured        speech, denies SI or HI. Neuro: Awake, moves all 4 extremities,        good strength Head/Face: NC/AT, No obvious injuries/deformity        Eyes: PERRL, EOMI, conjunctivae and lids appear nl Neck: FROM,        supple Respiratory:  Lungs clr, no resp distress Cardiovascular:        RRR w/o MRG, pulses strong, color nl Abdomen/GI: soft, NT, Nl        BS, no HSM or mass Back: FROM, no bony tenderness, no CVAT        Skin: No obvious injury or diaphoresis, turgor good MS/        Extremity: FROM with good strength, no obvious deformity. I        have reviewed the Nurses Notes, EMS run sheet, and I agree. ED        Course: Patient enjoyed a sandwich, and then enjoyed some food        he brought with him himself. He was watched for a period of        time and remains calm. He is given outpatient resources and        urged outpatient followup. My Working Impression:        Schizophrenia, bipolar disorder, homelessness. Attending chart        complete and electronically signed: Sharrell Ku. Zachery Conch MD,        MS, Lacie Scotts, [phone].  [name]  Disposition:  03:24 My portion of the ED chart is complete / electronically signed: fdf        I have completed my portion of the chart, and have reviewed the        rest of the chart that was complete and available as of the        time of my signature.  .  .  Name:Shetley, Tiran  [id number]  [id number]  Page 2 of 3  %%PAGE  .  Name: Kadien, Lineman  MRN: [id number]  Age: 62 yrs  Sex: Male  DOB: [birth year]  Arrival Date: 05/27/2018  Arrival Time: 02:36  Account#: [id number]  .  Working Diagnosis: Schizophrenia, unspecified,  Bipolar disorder, unspecifi  ed,  Homelessness  PCP:  .  Disposition Summary:  05/27/18 03:19  Discharge Ordered        Location: Other                                                 fdf        Problem: new                                                    fdf        Symptoms: have improved                                         fdf        Condition: Stable                                               fdf        Diagnosis          - Schizophrenia, unspecified                                  fdf          - Bipolar disorder, unspecified                               fdf          -  Homelessness                                                fdf        Followup:                                                       fdf          - With: [facility], [facility]          - When:          - Reason: Continuance of care        Followup:                                                       fdf          -  With:          - When:          - Reason: Continuance of care        Discharge Instructions:          - Discharge Summary Sheet                                     fdf          - BIPOLAR DISORDER                                            fdf          - SCHIZOPHRENIA, General                                      fdf  Signatures:  Lorraine Lax                      MD   fdf  Ocie Bob                      RN   ag33  Teresita Madura                       PA-C jrc  [place], Debbie                           Reg  [phone]  .  Corrections: (The following items were deleted from the chart)  02:40 02:38 Allergies: Unable to obtain; ag33                         ag33  02:40 02:38 PMHx: pt unable to recall medication; ag33                ag33  .  Document is preliminary until electronically or manually signed by the atte  nding physician  .  .  .  .  .  .  .  .  .  Name:Mchugh, Shaquile  [id number]:[id number]  [id number]  Page 3 of 3  .  %%END

## 2018-07-25 ENCOUNTER — Emergency Department
Admission: EM | Admit: 2018-07-25 | Discharge: 2018-07-25 | Disposition: A | Discharge status: Home / Self Care | Payer: Self-pay | Attending: Emergency Medicine | Admitting: Emergency Medicine

## 2018-07-25 DIAGNOSIS — F17200 Nicotine dependence, unspecified, uncomplicated: Secondary | ICD-10-CM | POA: Insufficient documentation

## 2018-07-25 DIAGNOSIS — Z5900 Homelessness unspecified: Secondary | ICD-10-CM

## 2018-07-25 DIAGNOSIS — G8929 Other chronic pain: Secondary | ICD-10-CM

## 2018-07-25 DIAGNOSIS — F419 Anxiety disorder, unspecified: Secondary | ICD-10-CM | POA: Insufficient documentation

## 2018-07-25 DIAGNOSIS — R51 Headache: Secondary | ICD-10-CM | POA: Insufficient documentation

## 2018-07-25 MED ORDER — ACETAMINOPHEN 500 MG PO TABS
500.00 mg | ORAL_TABLET | Freq: Four times a day (QID) | ORAL | 0 refills | Status: AC | PRN
Start: 2018-07-25 — End: 2018-08-04

## 2018-07-25 MED ORDER — ACETAMINOPHEN 500 MG PO TABS
1000.0000 mg | ORAL_TABLET | Freq: Once | ORAL | Status: AC
Start: 2018-07-25 — End: 2018-07-25
  Administered 2018-07-25: 05:00:00 1000 mg via ORAL
  Filled 2018-07-25: qty 2

## 2018-07-25 NOTE — ED Notes (Signed)
Bed: A06  Expected date:   Expected time:   Means of arrival:   Comments:  Medic 421

## 2018-07-25 NOTE — ED Provider Notes (Signed)
[facility] History and Physical Exam     Patient Name: [name], [name]  Encounter Date:  07/25/2018  Attending Physician: Elray Mcgregor, MD  Patient DOB:  1975/02/03  MRN:  [id number]  Room:  06/A06    Chief Complaint     Chief Complaint   Patient presents with   . Headache     History of Presenting Illness     43 y.o. male who is homeless says that he has a headache, but mainly is asking if we can help him with his homelessness.  He says he would like to find a place to have a shower, and he is asking if we can get him some fresh clothes, and he is hungry, and he would like [place] to arrange a ride back to his car which is in the [employer] parking lot, and he says he is having anxiety as well.  No feverishness.  No neck stiffness.  No arm or leg numbness or weakness.  He says he gets headaches a lot.  Current headache is been there "a few days"; this is similar to prior headaches.        PMD:  Pcp, None, MD    Nursing Notes Review  Nursing Notes were reviewed.     Previous Records Review  Previous records were reviewed to the extent practicable for the current presentation.          Physical Exam     Vital Signs  BP 132/86   Pulse 91   Temp 98.6 F (37 C)   Resp 18   Wt 107.8 kg   SpO2 97%     Review of Vital Signs  The patient's vital signs and oxygen saturation were reviewed and interpreted by me, Elray Mcgregor, MD.    Physical Exam    Constitutional: No acute distress. Coloration not ashen.  He is disheveled.  He is somewhat tangential.  He is answering questions appropriately for the most part, although he sometimes is saying non sequiturs.  Eyes: No conjunctival discharge. EOMI.   Head, Ears, Nose, Mouth, Throat: Normocephalic. Moist mucous membranes.   Neck: Full range of motion. No obvious neck deformities. No tracheal deviation. Can shake head back and forth vigorously without pain.  Cardiovascular: No obvious gallops. Normal capillary refill. Strong peripheral  pulses.  Respiratory/Chest: No obvious chest wall asymmetry. No resp distress.   Gastrointestinal/Abdominal:  Soft abdomen. No abdominal distention.   Musculoskeletal: Normal ROM. No focal extremity tenderness.   Back: No deformity. No significant scoliosis.   Neurological: Alert. No acute focal deficits.  5 out of 5 strength in arms and legs bilaterally.  Normal sensation throughout.  No facial droop.  Skin: Warm skin. No acute rash. Not mottled.           Medical Decision Making     Hx & Exam Synthesis, Differential Diagnosis, Plan     Headache: There are no features suggesting acute dangerous pathology such as meningitis or subarachnoid hemorrhage or stroke.  He appears comfortable.  Will give Tylenol.    Homelessness: We are giving him some food and drink.  We gave him the resources we have available in terms of brochures and phone numbers for him to contact shelters and access homelessness resources in this area.    Anxiety: He has a somewhat bizarre affect.  No suicidal ideation though and no homicidal ideation.  I recommended that he see the [facility] and he thinks this is  a good idea as well.    Will discharge with close follow-up.      ED Course, Monitors, EKG, Critical Care, Splints, Consults, Reevaluation, etc                Return Precautions    I counseled extensively on nature of problem--voiced understanding, agrees to follow up.   Given strict return precautions--fully understands:   Pt is to return immediately to emergency department if any worsening symptoms at all--otherwise, return to the emergency department tomorrow for a recheck or follow up with primary physician in mental health physician tomorrow.              Past Medical History     History reviewed. No pertinent past medical history.    Medications       Current Facility-Administered Medications:   .  acetaminophen (TYLENOL) tablet 1,000 mg, 1,000 mg, Oral, Once, Elray Mcgregor, MD    Current Outpatient  Medications:   .  acetaminophen (TYLENOL) 500 MG tablet, Take 1-2 tablets (500-1,000 mg total) by mouth every 6 (six) hours as needed for Pain, Disp: 10 tablet, Rfl: 0    Allergies     No Known Allergies    Medical history, medications, and allergies reviewed.     Past Surgical History     History reviewed. No pertinent surgical history.    Family History   The family history is not significantly contributory to current presentation.   History reviewed. No pertinent family history.    Social History   Social history is not significantly contributory to the patient's current presentation.   Social History     Socioeconomic History   . Marital status: Single     Spouse name: Not on file   . Number of children: Not on file   . Years of education: Not on file   . Highest education level: Not on file   Occupational History   . Not on file   Social Needs   . Financial resource strain: Not on file   . Food insecurity     Worry: Not on file     Inability: Not on file   . Transportation needs     Medical: Not on file     Non-medical: Not on file   Tobacco Use   . Smoking status: Current Every Day Smoker   . Smokeless tobacco: Never Used   Substance and Sexual Activity   . Alcohol use: Yes   . Drug use: Never   . Sexual activity: Not on file   Lifestyle   . Physical activity     Days per week: Not on file     Minutes per session: Not on file   . Stress: Not on file   Relationships   . Social Wellsite geologist on phone: Not on file     Gets together: Not on file     Attends religious service: Not on file     Active member of club or organization: Not on file     Attends meetings of clubs or organizations: Not on file     Relationship status: Not on file   . Intimate partner violence     Fear of current or ex partner: Not on file     Emotionally abused: Not on file     Physically abused: Not on file     Forced sexual activity: Not on file   Other Topics Concern   .  Not on file   Social History Narrative   . Not on file        Review of Systems     See HPI for review of systems that is relevant to the current presentation.   All other systems reviewed: negative.     ED Medications Administered     ED Medication Orders (From admission, onward)    Start Ordered     Status Ordering Provider    07/25/18 [phone] 07/25/18 0438  acetaminophen (TYLENOL) tablet 1,000 mg  Once     Route: Oral  Ordered Dose: 1,000 mg     Ordered Lora Glomski N          Orders Placed During This Encounter   No orders of the defined types were placed in this encounter.      Diagnostic Study Results     The results of the diagnostic studies below were reviewed by the ED provider:    Labs  Results     ** No results found for the last 24 hours. **          Radiologic Studies  Radiology Results (24 Hour)     ** No results found for the last 24 hours. Lynford Humphrey and MD Attestations     Rendering Provider: Elray Mcgregor, MD          Diagnosis and Disposition     Clinical Impression  1. Chronic nonintractable headache, unspecified headache type    2. Homelessness    3. Anxiety        Disposition  ED Disposition     ED Disposition Condition Date/Time Comment    Discharge  Wed Jul 25, 2018  4:29 AM Laurence Slate discharge to home/self care.    Condition at disposition: Stable              Prescriptions       New Prescriptions    ACETAMINOPHEN (TYLENOL) 500 MG TABLET    Take 1-2 tablets (500-1,000 mg total) by mouth every 6 (six) hours as needed for Pain        Elray Mcgregor, MD  07/25/18 [phone]

## 2018-07-25 NOTE — Discharge Instructions (Signed)
#######################################    Return immediately to the emergency room if you have any worsening symptoms!!! Otherwise, see general doctor tomorrow, or return to the emergency department tomorrow if you have any trouble getting an appointment.       #######################################            Thank you for choosing [facility] for your emergency care needs.  We strive to provide EXCELLENT care to you and your family.      If you do not continue to improve or your condition worsens, please contact your doctor or return immediately to the Emergency Department.    The examination and treatment you have received in our Emergency Department is provided on an emergency basis, and is not intended to be a substitute for your primary care physician.  It is important that your doctor checks you again and that you report any new or remaining problems at that time.      DOCTOR REFERRALS  Call [phone] (available 24 hours a day, 7 days a week) if you need any further referrals and we can help you find a primary care doctor or specialist.  Also, available online at:  [email or web address]    YOUR CONTACT INFORMATION  Before leaving please check with registration to make sure we have an up-to-date contact number.  You can call registration at [phone] to update your information.  For questions about your hospital bill, please call [phone].  For questions about your Emergency Dept Physician bill please call [phone].      FREE HEALTH SERVICES  If you need help with health or social services, please call 2-1-1 for a free referral to resources in your area.  2-1-1 is a free service connecting people with information on health insurance, free clinics, pregnancy, mental health, dental care, food assistance, housing, and substance abuse counseling.  Also, available online at:  http://[email or web address].org    MEDICAL RECORDS AND TESTS  Certain laboratory test results  do not come back the same day, for example urine cultures.   We will contact you if other important findings are noted.  Radiology films are often reviewed again to ensure accuracy.  If there is any discrepancy, we will notify you.      Please call [phone] to pick up a complimentary CD of any radiology studies performed.  If you or your doctor would like to request a copy of your medical records, please call [phone].      ORTHOPEDIC INJURY   Please know that significant injuries can exist even when an initial x-ray is read as normal or negative.  This can occur because some fractures (broken bones) are not initially visible on x-rays.  For this reason, close outpatient follow-up with your primary care doctor or bone specialist (orthopedist) is required.    MEDICATIONS AND FOLLOWUP  Please be aware that some prescription medications can cause drowsiness.  Use caution when driving or operating machinery.    24 HOUR PHARMACIES  [facility] - [address] [place], [place] [address] (1.4 miles, 7 minutes)  [facility] - [address] [place], [place] [address] (6.5 miles, 13 minutes)  A handout with directions is available on request.    PATIENT RELATIONS  If you have any concerns, issues or feedback with your care, positive or negative, please do not hesitate to contact Patient Relations at [phone]. They are open from 8:30AM-5:00PM Monday through Friday.      Headache    You have been  treated for a headache.    Headaches are very common. Most of the time they are benign (not harmful). Some headaches can be very serious. Your headache appears to be benign. The doctor feels it is OK for you to go home.    If you continue to have headaches, or if this headache does not go away over the next few days, you should be evaluated by your regular doctor or a neurologist. Keeping a "headache diary" may help your doctor learn the cause of your headaches.    When you get a headache, write down:   What happens before your  headache starts - Where you were, what you were doing, if you ate anything, and so on.   Where your pain is.   What kind-of pain you have - Sharp, aching, throbbing, burning.    What helps your headache get better.    Take your headache medication as directed. This is very important if your doctor has placed you on a daily medication to prevent headaches.    Return here or go to the nearest Emergency Department immediately if:   Your headache gets worse.   You have a severe headache that starts suddenly.   Your head pain is different from your normal headache.   You have a fever (temperature higher than 100.21F / 38C), especially with a stiff neck.   You feel numbness, tingling, or weakness in your arms or legs.   You pass out.   You have problems with your vision.   You vomit (throw up) and have trouble taking medication or keeping it down.    If you can't follow up with your doctor, or if at any time you feel you need to be rechecked or seen again, come back here or go to the nearest emergency department.       Generalized Anxiety Disorder (GAD)    You were seen for Generalized Anxiety Disorder (GAD).    Generalized Anxiety Disorder happens when someone worries too much about daily life without having a clear reason why. The anxiety can be caused by normal, everyday things even when there is little or no cause for worry. The person can be very anxious about just getting through the day.     GAD often starts when people are teens or young adults. Sometimes this problem is hard to diagnose because people with GAD may not have specific complaints when they see the doctor. This can make it hard to figure out exactly what is going on and to make the right diagnosis.     Vague complaints can include:     Problems focusing.   Feeling tired.   Having trouble sleeping or feeling restless.   Being startled easily (jumpy).   Feeling worried all the time.    Often people worry so much that they can't  have a normal relationship, do their daily activities or do well at work. They often worry all day long. This often happens when people are under a lot of stress. Generalized Anxiety is different from Panic Attacks. Usually, panic attacks start suddenly and then go away fairly quickly. In between panic attacks, the person can feel normal.    Most of the time, a psychiatrist or primary care doctor can treat Generalized Anxiety Disorder. The doctor you saw today feels this is the best plan for you.    Sometimes having anxiety can lead to serious problems. Some people feel very depressed or like hurting themselves. We don't  believe your condition is dangerous right now. However, you need to be careful. Sometimes a problem that seems small can get serious later.     Some things that can be tried are:     Anxiety support groups.   Antidepressant medications.   Individual therapy.    YOU SHOULD SEEK MEDICAL ATTENTION IMMEDIATELY, EITHER HERE OR AT THE NEAREST EMERGENCY DEPARTMENT, IF ANY OF THE FOLLOWING OCCUR:     You feel like hurting yourself or someone else.   You notice your heart is racing and can't explain why.    If you develop chest pain.   You are abusing alcohol or any other drugs.   You have trouble with your follow-up or have any other concerns.    The number for the Suicide Prevention Hotline is 1-800-SUICIDE 8124891511) or 1-800-273-TALK (8255).    If you can't follow up with your doctor, or if at any time you feel you need to be rechecked or seen again, come back here or go to the nearest emergency department.

## 2018-07-30 ENCOUNTER — Emergency Department: Payer: PRIVATE HEALTH INSURANCE

## 2018-07-30 ENCOUNTER — Emergency Department
Admission: EM | Admit: 2018-07-30 | Discharge: 2018-07-31 | Disposition: A | Discharge status: Home / Self Care | Payer: PRIVATE HEALTH INSURANCE | Attending: Emergency Medical Services | Admitting: Emergency Medical Services

## 2018-07-30 DIAGNOSIS — M25571 Pain in right ankle and joints of right foot: Secondary | ICD-10-CM | POA: Insufficient documentation

## 2018-07-30 DIAGNOSIS — F17200 Nicotine dependence, unspecified, uncomplicated: Secondary | ICD-10-CM | POA: Insufficient documentation

## 2018-07-30 DIAGNOSIS — Z5900 Homelessness unspecified: Secondary | ICD-10-CM

## 2018-07-30 DIAGNOSIS — M25572 Pain in left ankle and joints of left foot: Secondary | ICD-10-CM | POA: Insufficient documentation

## 2018-07-30 NOTE — ED Notes (Signed)
Patient given sandwich and gatorade.

## 2018-07-30 NOTE — Discharge Instructions (Signed)
Joint Pain     You have been seen for joint pain.     Many things can cause pain in your joints. Having joint pain means that you often have inflammation in your joints.      Some things that can cause joint pain are:  · Rheumatoid arthritis (inflammation of the joints).  · Trauma or injury to your joints.  · A viral infection.  · Overuse of your joints.  · Obesity that causes excess wear and tear on your joints.  · Gout (swelling of the joints).  · Osteoarthritis (breakdown of cartilage in the joints).  · Inflammation of a joint tendon (tendinitis).     It is not yet known why you are having joint pains. You might need another exam or more tests to find out why you have these symptoms. At this time, the cause of your symptoms does not seem dangerous. You don't need to stay in the hospital.     Some things you can try at home are:  · Rest the affected joint.  · Frequent stretching.  · Massage.  · NSAID medications like ibuprofen (Advil® or Motrin®), naproxen (Aleve®, Naprosyn®).  · Elevating the joints that hurt.     Follow the instructions for any medication you are prescribed.      Follow up with your doctor in 7 days.     We don’t believe your condition is dangerous right now. However, you need to be careful. Sometimes a problem that seems small can get serious later. Therefore, it is very important for you to come back here or go to the nearest Emergency Department if you don't get better or your symptoms get worse.     YOU SHOULD SEEK MEDICAL ATTENTION IMMEDIATELY, EITHER HERE OR AT THE NEAREST EMERGENCY DEPARTMENT, IF ANY OF THE FOLLOWING OCCUR:     · You have a fever (temperature higher than 100.4°F or 38°C).  · Your pain does not go away or gets worse.  · You notice redness over the joint.  · Your painful joint gets very swollen.  · You don't feel better after treatment.  · Any other symptoms, concerns, or not getting better as expected.     If you can't follow up with your doctor, or if at any time you feel  you need to be rechecked or seen again, come back here or go to the nearest emergency department.

## 2018-07-30 NOTE — ED Notes (Signed)
Bed: A09  Expected date:   Expected time:   Means of arrival:   Comments:  Medic 440

## 2018-07-30 NOTE — ED Triage Notes (Addendum)
BIBA, pt c/o bilateral leg pain the started this morning, states the he does have any pain in his legs at the moment, states the he is having difficulty walking due to the pain. Pt is currently homeless, pt also has hx of bi-polar, states he is currently on meds and controled, however pt doesn't have meds on him and doesn't know the name. Pt states he drinks at least a 6 pack a day of alcohol.

## 2018-07-30 NOTE — ED Provider Notes (Signed)
History     Chief Complaint   Patient presents with   . Leg Pain     43 year old male presents by EMS with chief complaint of bilateral lower extremity pain.  Patient reports his symptoms have been ongoing for several days.  Reports that today he was doing a lot of walking.  Patient reports he has been homeless living outside for the last 4 weeks.  Also reports that a couple days ago he had some shortness of breath, he describes minimal cough, no fever, patient denies symptoms at this time.  Reports that his legs/ankles are not hurting and he does not feel short of breath.    The history is provided by the patient.   Leg Pain   Lower extremity pain location: bilateral ankles.  Pain details:     Quality:  Aching    Radiates to:  Does not radiate    Severity:  Mild    Onset quality:  Gradual    Timing:  Constant  Chronicity:  Recurrent (Patient reports he had similar pain in the past with prolonged periods of ambulation)  Prior injury to area:  Yes  Relieved by:  Rest and ice  Worsened by:  Nothing  Associated symptoms: no back pain, no fever, no muscle weakness, no swelling and no tingling             Past Medical History:   Diagnosis Date   . Manic bipolar I disorder        History reviewed. No pertinent surgical history.    History reviewed. No pertinent family history.    Social  Social History     Tobacco Use   . Smoking status: Current Every Day Smoker   . Smokeless tobacco: Never Used   Substance Use Topics   . Alcohol use: Yes   . Drug use: Never       .     No Known Allergies    Home Medications     Med List Status:  In Progress Set By: Kathrine Cords, RN at 07/30/2018 10:03 PM                acetaminophen (TYLENOL) 500 MG tablet     Take 1-2 tablets (500-1,000 mg total) by mouth every 6 (six) hours as needed for Pain           Review of Systems   Constitutional: Negative for chills and fever.   HENT: Negative.    Eyes: Negative.    Respiratory: Negative for shortness of breath.    Cardiovascular:  Negative for chest pain.   Gastrointestinal: Negative for abdominal pain.   Endocrine: Negative for polyuria.   Genitourinary: Negative for flank pain.   Musculoskeletal: Negative for back pain, gait problem and joint swelling.   Allergic/Immunologic: Negative.    Neurological: Negative for numbness.   Hematological: Negative for adenopathy. Does not bruise/bleed easily.   Psychiatric/Behavioral: Negative for suicidal ideas.   All other systems reviewed and are negative.      Physical Exam    BP: 106/78, Heart Rate: 86, Temp: 98.1 F (36.7 C), Resp Rate: 16, SpO2: 99 %, Weight: 107.8 kg    Physical Exam  Vitals signs and nursing note reviewed.   Constitutional:       Appearance: He is well-developed.      Comments: Appears comfortable, no distress   HENT:      Head: Normocephalic and atraumatic.   Eyes:      Pupils: Pupils  are equal, round, and reactive to light.   Neck:      Musculoskeletal: Normal range of motion and neck supple.   Cardiovascular:      Rate and Rhythm: Normal rate and regular rhythm.      Heart sounds: Normal heart sounds.   Pulmonary:      Breath sounds: Normal breath sounds. No wheezing or rales.   Abdominal:      Palpations: Abdomen is soft.      Tenderness: There is no abdominal tenderness.   Musculoskeletal: Normal range of motion.      Comments: There is no swelling in the bilateral lower extremities, bilateral knees and ankles are nontender with painless range of motion, full pulses dorsalis pedis bilaterally, patient is not wearing socks and his feet are very odiferous   Skin:     General: Skin is warm and dry.      Findings: No rash.   Neurological:      Mental Status: He is alert and oriented to person, place, and time.   Psychiatric:         Behavior: Behavior normal.         Thought Content: Thought content normal.       Xr Chest  Ap Portable    Result Date: 07/30/2018   No evidence of acute cardiopulmonary process. Demetrios Isaacs, MD  07/30/2018 11:37 PM      MDM and ED Course     ED  Medication Orders (From admission, onward)    None             MDM  Number of Diagnoses or Management Options  Arthralgia of both ankles:   Homelessness:   Diagnosis management comments: 43 year old male with bilateral ankle pain, physical exam is suggestive of arthralgia, no injury, no infection on the feet, patient does report some shortness of breath but this again appears to be exertional related to homelessness on very hot days.  Chest x-ray negative.  Patient does have some psychiatric issues, we discussed at length whether or not he needs to have a psych exam at this time, he would prefer to follow-up in the outpatient setting.  There is no suicidal or homicidal ideation present       Amount and/or Complexity of Data Reviewed  Tests in the radiology section of CPT: ordered and reviewed    Patient Progress  Patient progress: stable                   Procedures    Clinical Impression & Disposition     Clinical Impression  Final diagnoses:   Arthralgia of both ankles   Homelessness        ED Disposition     ED Disposition Condition Date/Time Comment    Discharge  Mon Jul 30, 2018 11:56 PM Laurence Slate discharge to home/self care.    Condition at disposition: Stable           Discharge Medication List as of 07/30/2018 11:56 PM             This note was generated by the Epic EMR system/ Dragon speech recognition and may contain inherent errors or omissions not intended by the user. Grammatical errors, random word insertions, deletions, pronoun errors and incomplete sentences are occasional consequences of this technology due to software limitations. Not all errors are caught or corrected. If there are questions or concerns about the content of this note or information contained within the body  of this dictation they should be addressed directly with the author for clarification         Hewitt Blade, [place]  07/31/18 [phone]

## 2018-07-31 ENCOUNTER — Emergency Department
Admission: EM | Admit: 2018-07-31 | Discharge: 2018-07-31 | Disposition: A | Discharge status: Home / Self Care | Payer: PRIVATE HEALTH INSURANCE | Attending: Emergency Medical Services | Admitting: Emergency Medical Services

## 2018-07-31 ENCOUNTER — Telehealth (HOSPITAL_BASED_OUTPATIENT_CLINIC_OR_DEPARTMENT_OTHER): Payer: Self-pay

## 2018-07-31 DIAGNOSIS — F329 Major depressive disorder, single episode, unspecified: Secondary | ICD-10-CM | POA: Insufficient documentation

## 2018-07-31 DIAGNOSIS — F4321 Adjustment disorder with depressed mood: Secondary | ICD-10-CM

## 2018-07-31 DIAGNOSIS — Z5900 Homelessness unspecified: Secondary | ICD-10-CM

## 2018-07-31 DIAGNOSIS — F32A Depression, unspecified: Secondary | ICD-10-CM

## 2018-07-31 LAB — COMPREHENSIVE METABOLIC PANEL
ALT: 27 U/L (ref 0–55)
AST (SGOT): 23 U/L (ref 5–34)
Albumin/Globulin Ratio: 1.2 (ref 0.9–2.2)
Albumin: 3.7 g/dL (ref 3.5–5.0)
Alkaline Phosphatase: 66 U/L (ref 38–106)
Anion Gap: 7 (ref 5.0–15.0)
BUN: 20 mg/dL (ref 9–28)
Bilirubin, Total: 0.5 mg/dL (ref 0.2–1.2)
CO2: 26 mEq/L (ref 22–29)
Calcium: 9.1 mg/dL (ref 8.5–10.5)
Chloride: 106 mEq/L (ref 100–111)
Creatinine: 1 mg/dL (ref 0.7–1.3)
Globulin: 3 g/dL (ref 2.0–3.6)
Glucose: 97 mg/dL (ref 70–100)
Potassium: 4.1 mEq/L (ref 3.5–5.1)
Protein, Total: 6.7 g/dL (ref 6.0–8.3)
Sodium: 139 mEq/L (ref 136–145)

## 2018-07-31 LAB — CBC AND DIFFERENTIAL
Absolute NRBC: 0 10*3/uL (ref 0.00–0.00)
Basophils Absolute Automated: 0.04 10*3/uL (ref 0.00–0.08)
Basophils Automated: 0.6 %
Eosinophils Absolute Automated: 0.15 10*3/uL (ref 0.00–0.44)
Eosinophils Automated: 2.1 %
Hematocrit: 46.1 % (ref 37.6–49.6)
Hgb: 15.6 g/dL (ref 12.5–17.1)
Immature Granulocytes Absolute: 0.03 10*3/uL (ref 0.00–0.07)
Immature Granulocytes: 0.4 %
Lymphocytes Absolute Automated: 2.31 10*3/uL (ref 0.42–3.22)
Lymphocytes Automated: 32.2 %
MCH: 30.3 pg (ref 25.1–33.5)
MCHC: 33.8 g/dL (ref 31.5–35.8)
MCV: 89.5 fL (ref 78.0–96.0)
MPV: 10.4 fL (ref 8.9–12.5)
Monocytes Absolute Automated: 1.01 10*3/uL — ABNORMAL HIGH (ref 0.21–0.85)
Monocytes: 14.1 %
Neutrophils Absolute: 3.64 10*3/uL (ref 1.10–6.33)
Neutrophils: 50.6 %
Nucleated RBC: 0 /100 WBC (ref 0.0–0.0)
Platelets: 193 10*3/uL (ref 142–346)
RBC: 5.15 10*6/uL (ref 4.20–5.90)
RDW: 14 % (ref 11–15)
WBC: 7.18 10*3/uL (ref 3.10–9.50)

## 2018-07-31 LAB — RAPID DRUG SCREEN, URINE
Barbiturate Screen, UR: NEGATIVE
Benzodiazepine Screen, UR: NEGATIVE
Cannabinoid Screen, UR: NEGATIVE
Cocaine, UR: NEGATIVE
Opiate Screen, UR: NEGATIVE
PCP Screen, UR: NEGATIVE
Urine Amphetamine Screen: NEGATIVE

## 2018-07-31 LAB — ETHANOL: Alcohol: NOT DETECTED mg/dL

## 2018-07-31 LAB — MAGNESIUM: Magnesium: 2 mg/dL (ref 1.6–2.6)

## 2018-07-31 LAB — GFR: EGFR: >60

## 2018-07-31 LAB — TSH: TSH: 2.16 u[IU]/mL (ref 0.35–4.94)

## 2018-07-31 LAB — SALICYLATE LEVEL: Salicylate Level: <5 mg/dL — ABNORMAL LOW (ref 15.0–30.0)

## 2018-07-31 LAB — ACETAMINOPHEN LEVEL: Acetaminophen Level: <7 ug/mL — ABNORMAL LOW (ref 10–30)

## 2018-07-31 LAB — PHOSPHORUS: Phosphorus: 4.7 mg/dL (ref 2.3–4.7)

## 2018-07-31 NOTE — ED Notes (Signed)
Psychiatric patient precautions initiated in room, 1:1 sitter initiated. Pt undressed and belongings screened and stored by security.

## 2018-07-31 NOTE — Progress Notes (Signed)
Psychiatric Evaluation Part I    [name] is a 43 y.o. male admitted to the [facility] Emergency Department who was seen on 07/31/2018 by Boykin Reaper.    Call Details  Patient Location: [facility] ED  Patient Room Number: A09  Time contacted by ED Physician: [phone]  Time consult began: 0840  Time (in minutes) from Call to Consult: 18  Time consult concluded: 0910  Referring ED Department  Emergency Department: [facility]        Discharge Planning  Living Arrangements: Alone  Support Systems: None  Type of Residence: Homeless  Patient expects to be discharged to:: shelters    Presenting Mental Status  Orientation Level: Oriented X4  Memory: Remote memory impaired  Thought Content: delusions  Thought Process: loose associations  Behavior: normal  Consciousness: Alert  Impulse Control: normal  Perception: normal  Eye Contact: normal  Attitude: cooperative  Mood: normal  Hopelessness Affects Goals: No  Hopelessness About Future: No  Affect: normal  Speech: normal  Concentration: impaired  Insight: fair  Judgment: fair  Appearance: normal  Appetite: decreased  Weight change?: normal  Energy: decreased  Sleep: difficulty falling asleep  Reliability of Reporter/Patient: fair    1) Have you wished you were dead or wished you could go to sleep and not wake up? "No"  2) Have you actually had any thoughts of killing yourself? "No"    If yes to 2, ask questions 3,4,5 and 6. If NO to 2, go directly to question 6.    3) Have you been thinking about how you might do this? N/A  4) Have you had these thoughts and had some intention of acting on them? N/A  5) Have you started to work out or worked out the details of how to kill yourself? Do you intend to carry out this plan? N/A  6) Have you ever done anything, started to do anything, or prepared to do anything to end your life? If yes, was this within the past three months? "No"       Justification for level of risk identified based on CSSR results: Pt denies current  SI/HI, as well as any history. He is able to contract for safety at this time.    Within the Last 6 Months:: no history of violence toward self  Greater than 6 Months Ago:: no history of violence toward self                 Recovery and Support Involvement  Support Systems: None          Preliminary Diagnosis #1: F43.21 Adjustment disorder with depressed mood           Violence Toward Others  Within the Last 6 Months:: no history of violence toward others  Greater than 6 Months Ago:: no history of violence toward others     Preliminary Diagnosis (DSM IV)  Axis I: F43.21 Adjustment disorder with depressed mood  Axis II: Deferred  Axis III: See medical chart  Axis IV: Primary support group, Housing, Occupational, Social environment, Economic, Access to health care services  Axis V on Admission: 80    Summary: Pt is a 43 y.o. male with a history of Bipolar I Disorder (reported via pt) who presents to [facility] ED due to depression. Pt BIB himself.    Pt reports persisting depression over the past 2 weeks without specific sources. Pt reports attending [employer] [facility] [employer], then leaving an buying a house, and recently  had to leave his house for necessary repairs. Pt reports being voluntarily homeless for the past 1 month due to said need for home repairs. Pt demonstrates some delusions with his thought process exhibiting loose associations. However, pt is alert and oriented x4. He denies feeling confused.    Pt denies current SI, HI, SIB, and AVH, as well as any history. He reports sleep disturbances occurring over the past 2 weeks whereby he has difficulty falling and staying asleep. Pt receiving an average of 2-3 hours of sleep per night. Pt also notes decrease in appetite over the past 2 weeks, consuming 1-2 meals daily. Energy level reported as low.     Pt reports a psych history of Bipolar I Disorder. He denies prior inpatient psychiatric hospitalizations. Pt is not currently followed by a psychiatrist nor therapist. Pt is  not prescribed any meds at this time. Pt denies knowledge of family psych history. He also denies legal history and medical history.     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 First age of use is age 27 with last use 2 weeks ago whereby pt drank 2 cases of beer. Pt reports a history of withdrawal seizures. He also notes a history of blackouts. BAL upon arrival is non-detected. UDS is negative for all substances.     Pt has been homeless for the past month but reports previously residing at a home in [place], [place]. Pt is unemployed at this time. Current psychosocial stressors include housing and mental health concerns. Pt denies having any support systems. After discussing treatment options, pt is agreeable to discharging from ED and following up with [facility] for psych services.    Disposition: This Clinical research associate consulted with PA Dorian Pod (ED provider) who has medically cleared pt and is in agreement with disposition. This Clinical research associate provided with [facility] walk-in information, to include contact number, hours of operation, and program details. Writer also provided pt with a list of local [facility] shelters to follow up with.    Justification for disposition: Pt presents to ED due to depression. Pt is able to contract for safety at this time. Pt is not at risk for harming self nor others. He is alert and oriented x4. Pt will follow up with [facility] and local shelters.    Resources Provided  [facility]: [phone]  [facility]: [phone]  [facility]: [phone]  [facility]: [phone]    Admission Status/Petitioner  If patient is voluntarily admitted to an [facility] inpatient psychiatric unit and decides to leave AMA within the first 8 hours on the unit, is there an identified petitioner? N/A    Name of Petitioner: N/A  Contact Information: N/A  If  not, why?: N/A    Insurance Pre-authorization information: N/A - pt d/c home    Was someone informed to have consent for voluntary admission obtained and scanned into EPIC? N/A   Who was informed? N/A      Boykin Reaper, MSW, Supervisee of Social Work  Emergency Psychiatric Clinician I  [facility]  [address]  [place], [place] [address]  [phone]

## 2018-07-31 NOTE — ED Triage Notes (Signed)
Pt here last night for leg pain, has hx of bi-polar, states he would like help to "clear up my mind", wants to admitted to psych hospital. Still complaining of leg pain. Denies SI/HI.

## 2018-07-31 NOTE — ED Provider Notes (Signed)
[facility] EMERGENCY DEPARTMENT PATIENT ENCOUNTER      Patient Information   Patient Name: [name], [name]  Encounter Date:  07/31/2018  Patient DOB:  10-29-1975  MRN:  [id number]  Room:  09/A09  Rendering Provider: Suzi Roots. Kaydenn Mclear, PA-C  History of Presenting Illness   Chief Complaint: depression    HPI Comments:   43 y.o. male with history of bipolar disorder presents to the ED for evaluation of increasing depression recently.  Patient says that he has recently become homeless because there is some work that needs to be done on the house that he was staying at but it has not been done.  Says that she is somewhat choosing to be homeless but he is also not welcome back in the home that he was living in.  He is not currently working.  Says he has become increasingly depressed for the past 2 or 3 weeks.  No specific triggers.  He is not currently staying in a shelter.  He says that he does not have any suicidal or homicidal ideations.  He does have history of depression for which he was on medication but has not been on medication in years he states.  He denies any history of drug use.  Says that he drinks a sixpack of beer daily and no history of withdrawal. Denies previous hospitalizations for psych reasons.    Patient was here last night as well for leg pain and was discharged home. Came back in this morning because he had nowhere to go    PMD: Pcp, None, MD  Past Medical History     Past Medical History:   Diagnosis Date   . Manic bipolar I disorder      Past Surgical History   No past surgical history on file.  Family History   No family history on file.  Social History   Patient is a non-smoker  + Daily ETOH  Allergies   No Known Allergies  Home Medications     Prior to Admission medications    Medication Sig Start Date End Date Taking? Authorizing Provider   acetaminophen (TYLENOL) 500 MG tablet Take 1-2 tablets (500-1,000 mg total) by mouth every 6 (six) hours as needed for Pain 07/25/18 08/04/18  Elray Mcgregor,  MD     Review of Systems     Constitutional: Negative for fever  HENT: Negative for sore throat  Cardiovascular: Negative for chest pain  Respiratory: Negative for cough  Gastrointestinal: Negative for vomiting and diarrhea  Musculoskeletal: Negative for MSK injury or trauma   Skin: Negative for rash  Neuro: Negative for head injury    Physical Exam   PHYSICAL EXAM  Patient Vitals for the past 24 hrs:   BP Temp Temp src Pulse Resp SpO2 Height Weight   07/31/18 0930 94/53 - - 91 - 96 % - -   07/31/18 0800 104/69 - - 73 - 97 % - -   07/31/18 0700 111/71 - - 80 - 98 % - -   07/31/18 0630 110/75 - - 81 - 99 % - -   07/31/18 0616 91/72 98.6 F (37 C) Oral 87 16 99 % 6\' 2"  (1.88 m) 107.9 kg     Constitutional: Pt is oriented to person, place, and time. Pt appears well-developed and well-nourished. No distress.  Non-toxic appearing  HENT:     Head: Normocephalic and atraumatic.     Right/Left Ear: External ear normal.     Mouth/Throat: Moist mucous membranes.  Uvula is midline. Oropharynx is clear.  Eyes: Conjunctivae normal. Pupils are equal, round, and reactive to light.   Neck: Normal range of motion. Supple.  Cardiovascular: Regular rate and rhythm. No murmur.    Pulmonary/Chest: Vesicular breath sounds throughout. No respiratory distress. O2 sat 98% on RA. No wheezes, rales or crackles.  Abdominal: Bowel sounds are present throughout.   Neurological: Pt is alert and oriented to person, place, and time. GCS 15. Normal speech.  Skin: Skin is warm and dry. No rashes  Psychiatric: Odd affect, answering questions in weird way and not always directly. At times dozing off    Orders Placed During This Encounter     Orders Placed This Encounter   Procedures   . Comprehensive metabolic panel   . Magnesium   . Phosphorus   . CBC and differential   . Ethanol (Alcohol) Level   . Acetaminophen level   . Salicylate level   . TSH   . Urine Rapid Drug Screen   . GFR   . ECG 12 Lead     ED Medications Administered     ED Medication  Orders (From admission, onward)    None        Diagnostic Study Results and Data Review   The results of the diagnostic studies below were reviewed by the ED provider:  Labs  Results     Procedure Component Value Units Date/Time    Urine Rapid Drug Screen [[id number]] Collected:  07/31/18 0744    Specimen:  Urine Updated:  07/31/18 0808     Amphetamine Screen, UR Negative     Barbiturate Screen, UR Negative     Benzodiazepine Screen, UR Negative     Cannabinoid Screen, UR Negative     Cocaine, UR Negative     Opiate Screen, UR Negative     PCP Screen, UR Negative    Salicylate level [[id number]]  (Abnormal) Collected:  07/31/18 0710    Specimen:  Blood Updated:  07/31/18 0737     Salicylate Level <5.0 mg/dL     GFR [[id number]] Collected:  07/31/18 0710     Updated:  07/31/18 0737     EGFR >60.0    Magnesium [[id number]] Collected:  07/31/18 0710    Specimen:  Blood Updated:  07/31/18 0737     Magnesium 2.0 mg/dL     Phosphorus [[id number]] Collected:  07/31/18 0710    Specimen:  Blood Updated:  07/31/18 0737     Phosphorus 4.7 mg/dL     Ethanol (Alcohol) Level [[id number]] Collected:  07/31/18 0710    Specimen:  Blood Updated:  07/31/18 0737     Alcohol None Detected mg/dL     Acetaminophen level [[id number]]  (Abnormal) Collected:  07/31/18 0710    Specimen:  Blood Updated:  07/31/18 0737     Acetaminophen Level <7 ug/mL     Comprehensive metabolic panel [[id number]] Collected:  07/31/18 0710    Specimen:  Blood Updated:  07/31/18 0737     Glucose 97 mg/dL      BUN 20 mg/dL      Creatinine 1.0 mg/dL      Sodium 756 mEq/L      Potassium 4.1 mEq/L      Chloride 106 mEq/L      CO2 26 mEq/L      Calcium 9.1 mg/dL      Protein, Total 6.7 g/dL      Albumin 3.7 g/dL      AST (SGOT) 23 U/L  ALT 27 U/L      Alkaline Phosphatase 66 U/L      Bilirubin, Total 0.5 mg/dL      Globulin 3.0 g/dL      Albumin/Globulin Ratio 1.2     Anion Gap 7.0    CBC and differential [[id number]]  (Abnormal) Collected:  07/31/18 0710    Specimen:   Blood Updated:  07/31/18 0719     WBC 7.18 x10 3/uL      Hgb 15.6 g/dL      Hematocrit 21.3 %      Platelets 193 x10 3/uL      RBC 5.15 x10 6/uL      MCV 89.5 fL      MCH 30.3 pg      MCHC 33.8 g/dL      RDW 14 %      MPV 10.4 fL      Neutrophils 50.6 %      Lymphocytes Automated 32.2 %      Monocytes 14.1 %      Eosinophils Automated 2.1 %      Basophils Automated 0.6 %      Immature Granulocytes 0.4 %      Nucleated RBC 0.0 /100 WBC      Neutrophils Absolute 3.64 x10 3/uL      Lymphocytes Absolute Automated 2.31 x10 3/uL      Monocytes Absolute Automated 1.01 x10 3/uL      Eosinophils Absolute Automated 0.15 x10 3/uL      Basophils Absolute Automated 0.04 x10 3/uL      Immature Granulocytes Absolute 0.03 x10 3/uL      Absolute NRBC 0.00 x10 3/uL           Radiologic Studies  Radiology Results (24 Hour)     ** No results found for the last 24 hours. **          Abnormal results/incidental findings discussed with pt and/or family: Yes  Monitors, EKG, Procedures, Critical Care, and Splints   EKG: Normal sinus rhythm with peaked T waves and nonspecific ST abnormality, normal axis, normal intervals, no old EKG for comparison  MDM, Consults and Clinical Notes   Working Differential (not completely inclusive): Electrolyte abnormality, dehydration, homelessness, depression    Nursing records reviewed: Yes    Clinical Notes/MDM: 43 y.o. male seen in the emergency department last night for evaluation of leg pain presents to the ED today for depression.  Patient is currently homeless.  He says that he has been increasingly depressed because of this recently.  Denies SI or HI.  Will check psych labs then touch base with psychiatric liaison to try and provide resources.  Do not necessarily think that he needs inpatient hospitalization    PPE option 1 was utilized.     PPE options:  1. surgical mask, gloves  2. surgical mask, gloves, face shield/goggles  3. surgical mask, face shield/goggles, gown, gloves  4. N95 mask, face  shield/goggles, gown, gloves  5. 43M PAPR, gown, gloves  6. not applicable   Re-Eval at the following times:  ED Course as of Jul 30 1617   Tue Jul 31, 2018   0751 Labs competely normal and reassuring  UA and UDS sent to medically clear then will speak to psych    [AB]   0820 Patient is medically clear  Will speak with psych liason    [AB]   [phone] Psych to evaluate the patient shortly    [AB]   [phone]  Speaking with psych now    [AB]   [phone] Spoke with central access  Refer to [facility] and outpatient homeless resources given   [AB]      ED Course User Index  [AB] Jeanne Ivan, [place]     1200  Case management saw the patient because he had been sitting in the lobby for hours  Will arrange for him to get to shelter    COVID Notes  The patient was seen and evaluated during the time of COVID pandemic.  Significant limitations can be present in the evaluation and management of ED patients during pandemic conditions, including but not limited to lack of testing, rapidly changing IHS protocols, limited evaluation and follow-up resources.     Prescriptions     Discharge Medication List as of 07/31/2018 11:06 AM        Diagnosis and Disposition   Clinical Impression:  1. Homelessness    2. Depression, unspecified depression type      Final diagnoses:   Homelessness   Depression, unspecified depression type       Disposition:  ED Disposition     ED Disposition Condition Date/Time Comment    Discharge  Tue Jul 31, 2018  9:52 AM Laurence Slate discharge to home/self care.    Condition at disposition: Stable        Rendering Provider: Suzi Roots. Lynnox Girten, PA-C  Attending's signature signifies review of the provider note and clinical impression.    This note was generated by the [facility] EMR system/Dragon speech recognition and may contain inherent errors or omissions not intended by the user. Grammatical errors, random word insertions, deletions, pronoun errors and incomplete sentences are occasional consequences of this technology due to software  limitations. Not all errors are caught or corrected. If there are questions or concerns about the content of this note or information contained within the body of this dictation they should be addressed directly with the author for clarification.       Jeanne Ivan, [place]  07/31/18 [phone]

## 2018-07-31 NOTE — Discharge Instructions (Signed)
Depression    After evaluation, your doctor feels that you are suffering from depression.    Depression is an illness that can make you feel guilty or worthless. It can make it hard to concentrate or make you lose interest in things you usually enjoy. It may also affect your ability to eat, sleep, and function like normal. Depression is caused by a chemical imbalance in the brain that affects mood. Depression can be treated!   The diagnosis of depression is given when these symptoms are present every day for at least 2 weeks.    It is important to follow up with a counselor as well as your family doctor. If you do not have an appointment in the next 2 to 3 days, call and make one. It is VERY IMPORTANT that your counselor and family doctor know that your problems are getting worse.    It is important to have a counselor and a family doctor who see you on a regular basis. They can help you with your problem, keep a close eye on you and follow your progress. You have been given a list of phone numbers to call if you feel that you need to talk to someone before your regular appointment.    Drinking alcohol can make your depression worse. Do not drink alcohol while taking antidepressant medication.    YOU SHOULD SEEK MEDICAL ATTENTION IMMEDIATELY, EITHER HERE OR AT THE NEAREST EMERGENCY DEPARTMENT, IF ANY OF THE FOLLOWING OCCURS:   You do not feel safe in your home environment.   You think of harming yourself or suicide.   You think of harming others.   You become worse and feel that you cannot wait until your follow-up appointment for treatment.             Dear Laurence Slate,    You were seen today by Dorian Pod, PA-C. Thank you for choosing the [facility] for your healthcare needs.  We hope your visit today was EXCELLENT.    Return to the emergency room immediately if you have any worsening symptoms!   Please take any medications prescribed as directed.     If you have any  questions or concerns, I am available at [phone]. Please do not hesitate to contact me if I can be of assistance.     Below is some information and resources that our patients often find helpful.    Sincerely,  Dorian Pod, Physician Assistant  [facility]    ________________________________________________________________  Thank you for choosing [facility] for your emergency care needs.  We strive to provide EXCELLENT care to you and your family.      IF YOU DO NOT CONTINUE TO IMPROVE OR YOUR CONDITION WORSENS, PLEASE CONTACT YOUR DOCTOR OR RETURN IMMEDIATELY TO THE EMERGENCY DEPARTMENT.    DOCTOR REFERRALS  Call [phone] (available 24 hours a day, 7 days a week) if you need any further referrals and we can help you find a primary care doctor or specialist.  Also, available online at:  [email or web address]    YOUR CONTACT INFORMATION  Before leaving please check with registration to make sure we have an up-to-date contact number.  You can call registration at [phone] to update your information.  For questions about your hospital bill, please call [phone].  For questions about your Emergency Dept Physician bill please call [phone].      FREE HEALTH SERVICES  If you need help with health or social services, please call 2-1-1 for a free referral to resources in your area.  2-1-1 is a free service connecting people with information on health insurance, free clinics, pregnancy, mental health, dental care, food assistance, housing, and substance abuse counseling.  Also, available online at:  [email or web address].org      PATIENT RELATIONS  If you have any concerns, issues or feedback with your care, positive or negative, please do not hesitate to contact Patient Relations at [phone]. They are open from 8:30AM-5:00PM Monday through Friday.      MEDICAL RECORDS AND TESTS  Certain laboratory test results do not come  back the same day, for example urine cultures.  We will contact you if other important findings are noted. Radiology films are often reviewed again to ensure accuracy.  If there is any discrepancy, we will notify you.    Please call [phone] to pick up a complimentary CD of any radiology studies performed.  If you or your doctor would like to request a copy of your medical records, please call [phone].      ORTHOPEDIC INJURY   Please know that significant injuries can exist even when an initial x-ray is read as normal or negative.  This can occur because some fractures (broken bones) are not initially visible on x-rays.  For this reason, close outpatient follow-up with your primary care doctor or bone specialist (orthopedist) is required.    MEDICATIONS AND FOLLOWUP  Please be aware that some prescription medications can cause drowsiness.  Use caution when driving or operating machinery.    The examination and treatment you have received in our Emergency Department is provided on an emergency basis, and is not intended to be a substitute for your primary care physician.  It is important that your doctor checks you again and that you report any new or remaining problems at that time.      24 HOUR PHARMACIES  [id number]  [address]  [place], [place] [address]

## 2018-08-01 LAB — ECG 12-LEAD
Atrial Rate: 73 {beats}/min
P Axis: 71 degrees
P-R Interval: 188 ms
Q-T Interval: 382 ms
QRS Duration: 98 ms
QTC Calculation (Bezet): 420 ms
R Axis: 73 degrees
T Axis: 34 degrees
Ventricular Rate: 73 {beats}/min

## 2018-08-21 DIAGNOSIS — F17200 Nicotine dependence, unspecified, uncomplicated: Secondary | ICD-10-CM | POA: Insufficient documentation

## 2018-08-21 DIAGNOSIS — M791 Myalgia, unspecified site: Secondary | ICD-10-CM | POA: Insufficient documentation

## 2018-08-21 DIAGNOSIS — Z20828 Contact with and (suspected) exposure to other viral communicable diseases: Secondary | ICD-10-CM | POA: Insufficient documentation

## 2018-08-22 ENCOUNTER — Emergency Department: Payer: 344

## 2018-08-22 ENCOUNTER — Emergency Department
Admission: EM | Admit: 2018-08-22 | Discharge: 2018-08-22 | Disposition: A | Discharge status: Home / Self Care | Payer: 344 | Attending: Emergency Medicine | Admitting: Emergency Medicine

## 2018-08-22 DIAGNOSIS — M791 Myalgia, unspecified site: Secondary | ICD-10-CM

## 2018-08-22 LAB — CBC AND DIFFERENTIAL
Absolute NRBC: 0 10*3/uL (ref 0.00–0.00)
Basophils Absolute Automated: 0.01 10*3/uL (ref 0.00–0.08)
Basophils Automated: 0.2 %
Eosinophils Absolute Automated: 0.14 10*3/uL (ref 0.00–0.44)
Eosinophils Automated: 2.4 %
Hematocrit: 47.1 % (ref 37.6–49.6)
Hgb: 15.5 g/dL (ref 12.5–17.1)
Immature Granulocytes Absolute: 0.01 10*3/uL (ref 0.00–0.07)
Immature Granulocytes: 0.2 %
Lymphocytes Absolute Automated: 2.33 10*3/uL (ref 0.42–3.22)
Lymphocytes Automated: 40.3 %
MCH: 30.2 pg (ref 25.1–33.5)
MCHC: 32.9 g/dL (ref 31.5–35.8)
MCV: 91.6 fL (ref 78.0–96.0)
MPV: 10.9 fL (ref 8.9–12.5)
Monocytes Absolute Automated: 0.46 10*3/uL (ref 0.21–0.85)
Monocytes: 8 %
Neutrophils Absolute: 2.83 10*3/uL (ref 1.10–6.33)
Neutrophils: 48.9 %
Nucleated RBC: 0 /100 WBC (ref 0.0–0.0)
Platelets: 183 10*3/uL (ref 142–346)
RBC: 5.14 10*6/uL (ref 4.20–5.90)
RDW: 14 % (ref 11–15)
WBC: 5.78 10*3/uL (ref 3.10–9.50)

## 2018-08-22 LAB — GFR: EGFR: >60

## 2018-08-22 LAB — TROPONIN I: Troponin I: <0.01 ng/mL (ref 0.00–0.05)

## 2018-08-22 LAB — COMPREHENSIVE METABOLIC PANEL
ALT: 27 U/L (ref 0–55)
AST (SGOT): 26 U/L (ref 5–34)
Albumin/Globulin Ratio: 1.2 (ref 0.9–2.2)
Albumin: 3.6 g/dL (ref 3.5–5.0)
Alkaline Phosphatase: 80 U/L (ref 38–106)
Anion Gap: 10 (ref 5.0–15.0)
BUN: 12 mg/dL (ref 9.0–28.0)
Bilirubin, Total: 0.4 mg/dL (ref 0.2–1.2)
CO2: 25 mEq/L (ref 22–29)
Calcium: 9.2 mg/dL (ref 8.5–10.5)
Chloride: 106 mEq/L (ref 100–111)
Creatinine: 0.9 mg/dL (ref 0.7–1.3)
Globulin: 3 g/dL (ref 2.0–3.6)
Glucose: 94 mg/dL (ref 70–100)
Potassium: 3.8 mEq/L (ref 3.5–5.1)
Protein, Total: 6.6 g/dL (ref 6.0–8.3)
Sodium: 141 mEq/L (ref 136–145)

## 2018-08-22 LAB — ETHANOL: Alcohol: NOT DETECTED mg/dL

## 2018-08-22 LAB — B-TYPE NATRIURETIC PEPTIDE: B-Natriuretic Peptide: <10 pg/mL (ref 0–100)

## 2018-08-22 MED ORDER — ACETAMINOPHEN 500 MG PO TABS
1000.0000 mg | ORAL_TABLET | Freq: Once | ORAL | Status: AC
Start: 2018-08-22 — End: 2018-08-22
  Administered 2018-08-22: 05:00:00 1000 mg via ORAL
  Filled 2018-08-22: qty 2

## 2018-08-22 MED ORDER — AZITHROMYCIN 250 MG PO TABS
250.00 mg | ORAL_TABLET | Freq: Every day | ORAL | 0 refills | Status: AC
Start: 2018-08-22 — End: 2018-08-26

## 2018-08-22 MED ORDER — AZITHROMYCIN 250 MG PO TABS
500.00 mg | ORAL_TABLET | Freq: Once | ORAL | Status: AC
Start: 2018-08-22 — End: 2018-08-22
  Administered 2018-08-22: 05:00:00 500 mg via ORAL
  Filled 2018-08-22: qty 2

## 2018-08-22 NOTE — Discharge Instructions (Signed)
Dear [name]:    Thank you for choosing the [facility] Emergency Department, the premier emergency department in the [place] area.  I hope your visit today was EXCELLENT. You will receive a survey via text message that will give you the opportunity to provide feedback to your team about your visit. Please do not hesitate to reach out with any questions!    Specific instructions for your visit today:           Possible lung infection. We will check a covid test and treat you with an antibiotic      Myalgia    You have been diagnosed with muscle aches (myalgia).    Inflammation (irritation) of the muscles causes myalgias. This causes pain. Usually, this happens when the affected muscle is over-used or injured. Sometimes, the cause is a viral illness, or an autoimmune disease. There are many other possiblecauses.    One possible cause is due to a rare reaction to drugs called "statins." These drugs lower cholesterol. In rare cases, they cause muscle pain or even muscle breakdown. Symptoms include muscle aches, soreness, tenderness, or weakness.    Statins include some of the following medications:      Atorvastatin (Lipitor); Luvastatin (Lescol); lovastatin (Mevacor, Altoprev); pitavastatin (Livalo); pravastatin (Pravachol); rosuvastatin (Crestor); simvastatin (Zocor); others may become available.   Combination medications like simvastatin/ezetimibe (Vytorin).    Your doctor has decided, based on your exam today, that the cause of the muscle pains is not life-threatening or dangerous. Depending on the cause for your pain, you can expect your symptoms to get better over the next week. Sometimes the symptoms can last up to a few weeks.    We don't believe your condition is dangerous right now. However, you need to be careful. Sometimes a problem that seems small can get serious later. Therefore, it is very important for you to come back here or go to the nearest Emergency Department if you  don't get better or your symptoms get worse.     Your doctor may prescribe you pain medications to treat yourpain. You can also use over-the-counter medicines like acetaminophen (Tylenol) or anti-inflammatory medicinelike ibuprofen (Advil, Motrin) or Naproxen (Aleve, Naprosyn). It is important to follow the directions for taking these medications.    Some things you can do to help your injury are: Resting, Icing, Compressing and Elevating the injured area. Remember this as "RICE."     REST: Limit the use of the painful body part.      ICE: By applying ice to the affected area, swelling and pain can be reduced. Place some ice cubes in a re-sealable (Ziploc) bag and add some water. Put a thin washcloth between the bag and the skin. Apply the ice bag to the area for at least 20 minutes. Do this at least 4 times per day. It is okay to do this more often than directed. You can also do it for longer than directed. NEVER APPLY ICE DIRECTLY TO THE SKIN.      COMPRESS: Compression means to apply pressure around the painful area such as with a splint, cast or an ace bandage. Compression decreases swelling and improves comfort. Compression should be tight enough to relieve swelling but not so tight as to decrease circulation. Increasing pain, numbness, tingling, or change in skin color, are all signs of decreased circulation.      ELEVATE: Elevate the painful part.     When your pain starts to get better, you'll  need to do gentle stretches with the injured muscle and work on increasing your range of motion. This will help your muscles from getting stiff and make the symptoms not last as long.    YOU SHOULD SEEK MEDICAL ATTENTION IMMEDIATELY, EITHER HERE OR AT THE NEAREST EMERGENCY DEPARTMENT, IF ANY OF THE FOLLOWING OCCUR:     Your symptoms haven't started to get better in 5-10 days.   You start to have severe pain in the affected body part or the body part becomes pale, numb, and very firm to the  touch.   Your urine (pee) is the wrong color. This can be a sign of muscle breakdown.    If you can't follow up with your doctor, or if at any time you feel you need to be rechecked or seen again, come back here or go to the nearest emergency department.                 Follow-up Steps for Patients with Pending COVID-19 Testing - June 05, 2018     Thank you for choosing [facility]. During your visit, you received a COVID-19 test. You should receive your results in 3-5 days.    When will I know the results of my COVID-19 test?  There are multiple ways to see your COVID-19 test results after the 3-5 day waiting period. Please ensure we have your most up to date contact information on file before you leave your appointment.    1. The easiest way to find test results done at an [name] site is through your MyChart account, [name] online patient portal. This is also a good way to connect with your [name] primary care team for continued care. If you do not already have MyChart activated, you will be given an activation code at the time of testing. Please note that MyChart is unavailable for children age 43-17.    2. Your Primary Care Physician, the physician who ordered your test or another [name] team member may call you with your test results.    3. If you're having difficulty finding your test results, or if you have additional questions, please contact the COVID-19 Test Results Call Center at [phone] for further instructions. The call center is open daily from 8am - 8pm.    If you need help activating your MyChart account, please call [phone]. You can download the MyChart application to your phone using the QR codes below:                 What should I do while I wait for my test results?  . Take good care of yourself and be mindful of the safety of those around you.  [name] Kitchen Rest and stay well hydrated.  . Use acetaminophen (Tylenol) to help manage fever.  [name] your hands often.  . Stay at home  except to receive medical care.  . Call ahead before seeing your doctor, medical provider, or when seeking medical care at any care site.  . Isolate yourself from others as much as possible.  . Clean all "high touch" surfaces daily--such as tabletops, handles/doorknobs, and phones,    Instructions continue on next page    . Most people with COVID-19 will get better; however, a small number may require more treatment. Please contact your doctor or present to the nearest Emergency Department if you develop the warning signs of more severe infection such as:    ? A fever over 102 ?F not  controlled by acetaminophen (Tylenol)  ? Shortness of breath or chest pain  ? Worsening cough  ? Increasing weakness  ? Inability to tolerate oral fluids    Some general things to think about:  . If you have a medical emergency and need to call 911, notify the person answering the phone that you may have COVID-19. If possible, put on a facemask before the ambulance arrives.    . Persons placed under active monitoring or facilitated self-monitoring should follow instructions provided by their local health department or occupational health professionals, as appropriate.    When can I stop self-isolation?  Until you receive your results, you should remain home and avoid contact with others (self-isolation).  The decision to stop isolation precautions should be made on a case-by-case basis, in consultation with healthcare providers and state and local health departments.    . If your test result is negative, you should still self-isolate until 3 days after your symptoms stop.  . If your test result is positive, you should stay home for at least 10 days from the day your symptoms started. If you are still having symptoms at the end of 10 days, continue in-home isolation until 3 days after your symptoms stop. Talk to your healthcare provider to determine what follow-up is needed.  You can also schedule telemedicine visits with your primary  care physician to discuss ongoing symptoms or new concerns that may arise.    For further information, please consult the following websites:  Centers for Disease Control and Prevention (CDC)  MoralGame.si.html    [place] Department of Health  http://hubbard-west.org/    If you are ill and need to see a healthcare provider, but your primary physician is not available, the [facility] are open for walk in visits.  Open Monday - Friday, 8am - 8pm at the following locations:  . [facility]: [address] [place], [place] [address], [phone]  . [facility]: [address], [place], [place] [address], [phone]    Open Daily 8am - 8pm including weekends at the following location:  . [facility]: [address] [place], [place] [address], [phone]      All [facility] Emergency Departments can care for patients with all conditions, including COVID-19.  Do not hesitate to seek care should you have a healthcare emergency.        IF YOU DO NOT CONTINUE TO IMPROVE OR YOUR CONDITION WORSENS, PLEASE CONTACT YOUR DOCTOR OR RETURN IMMEDIATELY TO THE EMERGENCY DEPARTMENT.    Sincerely,  [name] Rynders, [place], MD  Attending Emergency Physician  [facility] Emergency Department    ONSITE PHARMACY  Our full service onsite pharmacy is located in the ER waiting room.  Open 7 days a week from 9 am to 9 pm.  We accept all major insurances and prices are competitive with major retailers.  Ask your provider to print your prescriptions down to the pharmacy to speed you on your way home.    OBTAINING A PRIMARY CARE APPOINTMENT    Primary care physicians (PCPs, also known as primary care doctors) are either internists or family medicine doctors. Both types of PCPs focus on health promotion, disease prevention, patient education and counseling, and treatment of acute and chronic medical conditions.    Call  for an appointment with a primary care doctor.  Ask to see who is taking new patients.     [facility]  telephone:  [phone]  [email or web address]    DOCTOR REFERRALS  Call [phone] (available 24 hours a day, 7 days a week) if you need any further referrals and we can help you find a primary care doctor or specialist.  Also, available online at:  [email or web address]    YOUR CONTACT INFORMATION  Before leaving please check with registration to make sure we have an up-to-date contact number.  You can call registration at [phone] to update your information.  For questions about your hospital bill, please call [phone].  For questions about your Emergency Dept Physician bill please call [phone].      FREE HEALTH SERVICES  If you need help with health or social services, please call 2-1-1 for a free referral to resources in your area.  2-1-1 is a free service connecting people with information on health insurance, free clinics, pregnancy, mental health, dental care, food assistance, housing, and substance abuse counseling.  Also, available online at:  http://www.211[place].org    MEDICAL RECORDS AND TESTS  Certain laboratory test results do not come back the same day, for example urine cultures.   We will contact you if other important findings are noted.  Radiology films are often reviewed again to ensure accuracy.  If there is any discrepancy, we will notify you.      Please call [phone] to pick up a complimentary CD of any radiology studies performed.  If you or your doctor would like to request a copy of your medical records, please call [phone].      ORTHOPEDIC INJURY   Please know that significant injuries can exist even when an initial x-ray is read as normal or negative.  This can occur because some fractures (broken bones) are not initially visible on x-rays.  For this reason, close outpatient follow-up with your primary care doctor or bone  specialist (orthopedist) is required.    MEDICATIONS AND FOLLOWUP  Please be aware that some prescription medications can cause drowsiness.  Use caution when driving or operating machinery.    The examination and treatment you have received in our Emergency Department is provided on an emergency basis, and is not intended to be a substitute for your primary care physician.  It is important that your doctor checks you again and that you report any new or remaining problems at that time.      24 HOUR PHARMACIES  The nearest 24 hour pharmacy is:    [facility]  [address]  [place], [place] [address]  [phone]      ASSISTANCE WITH INSURANCE    Affordable Care Act  [facility])  Call to start or finish an application, compare plans, enroll or ask a question.  [phone]  TTY: 7174156290  Web:  Healthcare.gov    Help Enrolling in [facility]  Cover [place]  2131581236 (TOLL-FREE)  ([phone] (TTY)  Web:  Http://www.coverva.org    Local Help Enrolling in the [facility]  [place] [employer]  [phone] (MAIN)  Email:  health-help@nvfs .org  Web:  BlackjackMyths.is  Address:  [address], [address] [place], [place] [address]    SEDATING MEDICATIONS  Sedating medications include strong pain medications (e.g. narcotics), muscle relaxers, benzodiazepines (used for anxiety and as muscle relaxers), Benadryl/diphenhydramine and other antihistamines for allergic reactions/itching, and other medications.  If you are unsure if you have received a sedating medication, please ask your physician or nurse.  If you received a sedating medication: DO NOT drive a car. DO NOT operate machinery. DO NOT  perform jobs where you need to be alert.  DO NOT drink alcoholic beverages while taking this medicine.     If you get dizzy, sit or lie down at the first signs. Be careful going up and down stairs.  Be extra careful to prevent falls.     Never give this medicine to others.     Keep this medicine  out of reach of children.     Do not take or save old medicines. Throw them away when outdated.     Keep all medicines in a cool, dry place. DO NOT keep them in your bathroom medicine cabinet or in a cabinet above the stove.    MEDICATION REFILLS  Please be aware that we cannot refill any prescriptions through the ER. If you need further treatment from what is provided at your ER visit, please follow up with your primary care doctor or your pain management specialist.    FREESTANDING EMERGENCY DEPARTMENTS OF [facility]  Did you know [facility] has two freestanding ERs located just a few miles away?  [facility] [place] and [facility] [place] have short wait times, easy free parking directly in front of the building and top patient satisfaction scores - and the same Board Certified Emergency Medicine doctors as [facility].

## 2018-08-22 NOTE — ED Provider Notes (Signed)
[facility] EMERGENCY DEPARTMENT H&P         CLINICAL SUMMARY          Diagnosis:    .     Final diagnoses:   None                 Disposition:      ED Disposition     None                       CLINICAL INFORMATION        HPI:      Chief Complaint: Shortness of Breath  .    [name] is a 43 y.o. male who presents with *body aches.  Patient unable to state duration of symptoms.  Initial complaint of shortness of breath to EMS.  Currently denies cough.  Patient states he has had COVID testing unsure when.  No history of known lung disease.  Patient denies current drugs.    History obtained from: Patient      ROS:      Positive and negative ROS elements as per HPI.   All Other Systems Reviewed and Negative: y        Physical Exam:      Pulse 96  BP 107/56  Resp 18  SpO2 98 %  Temp 98.4 F (36.9 C)    Physical Exam   Nursing note and vitals reviewed.   Constitutional: . Pt appears well-developed and well-nourished.  Eyes: Conjunctivae normal . No icterus  ENT: nl phonation no ear [place]  Cardiovascular: Normal rate, regular rhythm and normal heart sounds.   Pulmonary/Chest: Effort normal and breath sounds normal. No respiratory distress.   GI: Soft. There is no guarding. There is no tenderness  Musculoskeletal: Normal range of motion. no deformity.   Neurological: Pt is alert and oriented to person, place, and time. GCS eye subscore is 4. GCS verbal subscore is 5. GCS motor subscore is 6. MAE   Skin: Skin is warm and dry.   Psychiatric: Pt has a normal  affect. Pt behavior is abnormal.               PAST HISTORY        Primary Care Provider: Pcp, None, Bruce        PMH/PSH:    .     Past Medical History:   Diagnosis Date   . Manic bipolar I disorder        He has no past surgical history on file.      Social/Family History:      He reports that he has been smoking. He has never used smokeless tobacco. He reports current alcohol use. He reports that he does not use drugs.    No family history on file.      Listed  Medications on Arrival:    .     Previous Medications    No medications on file      Allergies: He has No Known Allergies.            VISIT INFORMATION                    Medications Given in the ED:    .     ED Medication Orders (From admission, onward)    None            Procedures:  Interpretations:      MDM:     Pulse Ox Analysis interpreted by me is  98 %% on RA nl without need for supplementation       Labs:I have reviewed and interpreted the labs at the time of visit. [name]      Radiologic Interpretation by me: No acute abnormalities             DDX: Cardiac ACS, pneumonia, COVID-19, fatigue    Clinical Course in the ED:        Patient undomiciled sleeping but arousable.  Possible infiltrate on chest x-ray will treat with antibiotics.  Will send for culture testing.  Patient without work of breathing normal saturation stable for discharge.         Discharge Prescriptions     None                This care is provided during an unprecedented national emergency due to the Novel Coronavirus (COVID-19). COVID-19 infections and transmission risks place heavy strains on healthcare resources. As this pandemic evolves, the Hospital and providers strive to respond fluidly, to remain operational, and to provide care relative to available resources and information. Outcomes are unpredictable and treatments are without well-defined guidelines. Further, the impact of COVID-19 on all aspects of emergency care, including the impact to patients seeking care for reasons other than COVID-19, is unavoidable during this national emergency        *This note was generated by the Epic EMR system/ Dragon speech recognition and may contain inherent errors or omissions not intended by the user. Grammatical errors, random word insertions, deletions, pronoun errors and incomplete sentences are occasional consequences of this technology due to software limitations. Not all errors are caught or corrected. If there are  questions or concerns about the content of this note or information contained within the body of this dictation they should be addressed directly with the author for clarification.*            RESULTS        Lab Results:      Results     ** No results found for the last 24 hours. **              Radiology Results:      No orders to display               Scribe Attestation:      I was acting as a scribe for [name] on [name]     I am the first provider for this patient and I personally performed the services documented.  is scribing for me on [name]. This note accurately reflects work and decisions made by me.  [name]          [name]  08/22/18 [phone]

## 2018-08-25 LAB — CORONAVIRUS 2 (SARS-COV-2) RNA DETECTION: SARS CoV 2 Overall Result: NOT DETECTED

## 2018-09-18 ENCOUNTER — Emergency Department
Admission: EM | Admit: 2018-09-18 | Discharge: 2018-09-18 | Disposition: A | Discharge status: Home / Self Care | Payer: PRIVATE HEALTH INSURANCE | Attending: Emergency Medicine | Admitting: Emergency Medicine

## 2018-09-18 ENCOUNTER — Telehealth (HOSPITAL_BASED_OUTPATIENT_CLINIC_OR_DEPARTMENT_OTHER): Payer: Self-pay

## 2018-09-18 DIAGNOSIS — F329 Major depressive disorder, single episode, unspecified: Secondary | ICD-10-CM | POA: Insufficient documentation

## 2018-09-18 DIAGNOSIS — Z9114 Patient's other noncompliance with medication regimen: Secondary | ICD-10-CM | POA: Insufficient documentation

## 2018-09-18 DIAGNOSIS — F32A Depression, unspecified: Secondary | ICD-10-CM

## 2018-09-18 DIAGNOSIS — F17200 Nicotine dependence, unspecified, uncomplicated: Secondary | ICD-10-CM | POA: Insufficient documentation

## 2018-09-18 DIAGNOSIS — F319 Bipolar disorder, unspecified: Secondary | ICD-10-CM

## 2018-09-18 LAB — COMPREHENSIVE METABOLIC PANEL
ALT: 25 U/L (ref 0–55)
AST (SGOT): 24 U/L (ref 5–34)
Albumin/Globulin Ratio: 1.2 (ref 0.9–2.2)
Albumin: 3.7 g/dL (ref 3.5–5.0)
Alkaline Phosphatase: 77 U/L (ref 38–106)
Anion Gap: 8 (ref 5.0–15.0)
BUN: 12 mg/dL (ref 9.0–28.0)
Bilirubin, Total: 0.5 mg/dL (ref 0.2–1.2)
CO2: 25 mEq/L (ref 22–29)
Calcium: 9.1 mg/dL (ref 8.5–10.5)
Chloride: 104 mEq/L (ref 100–111)
Creatinine: 0.8 mg/dL (ref 0.7–1.3)
Globulin: 3.2 g/dL (ref 2.0–3.6)
Glucose: 88 mg/dL (ref 70–100)
Potassium: 3.7 mEq/L (ref 3.5–5.1)
Protein, Total: 6.9 g/dL (ref 6.0–8.3)
Sodium: 137 mEq/L (ref 136–145)

## 2018-09-18 LAB — CBC AND DIFFERENTIAL
Absolute NRBC: 0 10*3/uL (ref 0.00–0.00)
Basophils Absolute Automated: 0.03 10*3/uL (ref 0.00–0.08)
Basophils Automated: 0.7 %
Eosinophils Absolute Automated: 0.15 10*3/uL (ref 0.00–0.44)
Eosinophils Automated: 3.3 %
Hematocrit: 44.1 % (ref 37.6–49.6)
Hgb: 14.6 g/dL (ref 12.5–17.1)
Immature Granulocytes Absolute: 0.01 10*3/uL (ref 0.00–0.07)
Immature Granulocytes: 0.2 %
Lymphocytes Absolute Automated: 1.85 10*3/uL (ref 0.42–3.22)
Lymphocytes Automated: 40.7 %
MCH: 30 pg (ref 25.1–33.5)
MCHC: 33.1 g/dL (ref 31.5–35.8)
MCV: 90.6 fL (ref 78.0–96.0)
MPV: 10.9 fL (ref 8.9–12.5)
Monocytes Absolute Automated: 0.57 10*3/uL (ref 0.21–0.85)
Monocytes: 12.5 %
Neutrophils Absolute: 1.94 10*3/uL (ref 1.10–6.33)
Neutrophils: 42.6 %
Nucleated RBC: 0 /100 WBC (ref 0.0–0.0)
Platelets: 165 10*3/uL (ref 142–346)
RBC: 4.87 10*6/uL (ref 4.20–5.90)
RDW: 14 % (ref 11–15)
WBC: 4.55 10*3/uL (ref 3.10–9.50)

## 2018-09-18 LAB — ETHANOL: Alcohol: 20 mg/dL — ABNORMAL HIGH

## 2018-09-18 LAB — GFR: EGFR: >60

## 2018-09-18 LAB — ECG 12-LEAD
Atrial Rate: 79 {beats}/min
P Axis: 67 degrees
P-R Interval: 188 ms
Q-T Interval: 396 ms
QRS Duration: 96 ms
QTC Calculation (Bezet): 454 ms
R Axis: 15 degrees
T Axis: 13 degrees
Ventricular Rate: 79 {beats}/min

## 2018-09-18 LAB — RAPID DRUG SCREEN, URINE
Barbiturate Screen, UR: NEGATIVE
Benzodiazepine Screen, UR: NEGATIVE
Cannabinoid Screen, UR: NEGATIVE
Cocaine, UR: NEGATIVE
Opiate Screen, UR: NEGATIVE
PCP Screen, UR: NEGATIVE
Urine Amphetamine Screen: NEGATIVE

## 2018-09-18 LAB — TSH: TSH: 1.44 u[IU]/mL (ref 0.35–4.94)

## 2018-09-18 LAB — SALICYLATE LEVEL: Salicylate Level: <5 mg/dL — ABNORMAL LOW (ref 15.0–30.0)

## 2018-09-18 LAB — PHOSPHORUS: Phosphorus: 4.3 mg/dL (ref 2.3–4.7)

## 2018-09-18 LAB — ACETAMINOPHEN LEVEL: Acetaminophen Level: <7 ug/mL — ABNORMAL LOW (ref 10–30)

## 2018-09-18 LAB — MAGNESIUM: Magnesium: 2.1 mg/dL (ref 1.6–2.6)

## 2018-09-18 NOTE — ED Provider Notes (Signed)
History     Chief Complaint   Patient presents with   . Depression     Patient with history of bipolar, questionable schizophrenia; noncompliant with medication for over 4 weeks; presents to the ER because he feels" very stressed"'s.  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  Patient is currently not on domiciled.  Denies hearing any voices.  Denies any suicidal or homicidal ideation.  Social history: No tobacco use, positive beer use, denies drug use.    The history is provided by the patient. No language interpreter was used.   Depression          Past Medical History:   Diagnosis Date   . Manic bipolar I disorder        No past surgical history on file.    No family history on file.    Social  Social History     Tobacco Use   . Smoking status: Current Every Day Smoker   . Smokeless tobacco: Never Used   Substance Use Topics   . Alcohol use: Yes   . Drug use: Never       .     No Known Allergies    Home Medications     None on File           Review of Systems   All other systems reviewed and are negative.      Physical Exam    BP: 137/80, Heart Rate: 89, Temp: 97.9 F (36.6 C), Resp Rate: 16, SpO2: 100 %, Weight: 107.9 kg    Physical Exam  Vitals signs and nursing note reviewed.   Constitutional:       General: He is not in acute distress.     Appearance: He is well-developed. He is not diaphoretic.   HENT:      Head: Normocephalic and atraumatic.      Right Ear: External ear normal.      Left Ear: External ear normal.      Nose: Nose normal.   Eyes:      General:         Right eye: No discharge.         Left eye: No discharge.      Conjunctiva/sclera: Conjunctivae normal.      Pupils: Pupils are equal, round, and reactive to light.   Neck:      Musculoskeletal: Normal range of motion and neck supple.      Trachea: Phonation normal. No tracheal deviation.   Cardiovascular:      Rate and Rhythm: Normal rate and regular rhythm.      Pulses:           Radial pulses are  2+ on the right side and 2+ on the left side.   Pulmonary:      Effort: Pulmonary effort is normal. No respiratory distress.   Musculoskeletal: Normal range of motion.   Skin:     General: Skin is warm and dry.   Neurological:      Mental Status: He is alert and oriented to person, place, and time.      GCS: GCS eye subscore is 4. GCS verbal subscore is 5. GCS motor subscore is 6.      Cranial Nerves: No cranial nerve deficit.   Psychiatric:         Speech: Speech normal.      Comments: Odd affect, poor eye contact, inconsistent  behavior- alternates between being alert and responsive and dosing of and mumbling.           MDM and ED Course     Case discussed with psych liaison who will evaluate patient  ED Medication Orders (From admission, onward)    None             MDM  Number of Diagnoses or Management Options     Amount and/or Complexity of Data Reviewed  Clinical lab tests: ordered and reviewed  Discuss the patient with other providers: yes    Risk of Complications, Morbidity, and/or Mortality  Presenting problems: moderate  Diagnostic procedures: moderate  Management options: moderate                     Procedures    Clinical Impression & Disposition     Clinical Impression  Final diagnoses:   None        ED Disposition     None           New Prescriptions    No medications on file                 Tommi Rumps, [place]  09/18/18 0217

## 2018-09-18 NOTE — ED Notes (Signed)
Called central access will try and assess pt again.

## 2018-09-18 NOTE — ED Notes (Signed)
Central Access conducting assessment right now.

## 2018-09-18 NOTE — EDIE (Signed)
COLLECTIVE?NOTIFICATION?09/18/2018 01:33?[name], [name]?MRN: [id number]    Criteria Met      5 ED Visits in 12 Months    Security and Safety  No recent Security Events currently on file    ED Care Guidelines  There are currently no ED Care Guidelines for this patient. Please check your facility's medical records system.    Flags      Negative COVID-19 Lab Result - [facility] - A specimen collected from this patient was negative for COVID-19 / Attributed By: [place] Department of Health / Attributed On: 08/27/2018       Prescription Monitoring Program  000??- Narcotic Use Score  000??- Sedative Use Score  000??- Stimulant Use Score  000??- Overdose Risk Score  - All Scores range from 000-999 with 75% of the population scoring < 200 and on 1% scoring above 650  - The last digit of the narcotic, sedative, and stimulant score indicates the number of active prescriptions of that type  - Higher Use scores correlate with increased prescribers, pharmacies, mg equiv, and overlapping prescriptions  - Higher Overdose Risk Scores correlate with increased risk of unintentional overdose death   Concerning or unexpectedly high scores should prompt a review of the PMP record; this does not constitute checking PMP for prescribing purposes.      E.D. Visit Count (12 mo.)  Facility Visits   [facility] 3   [facility] 2   Total 5   Note: Visits indicate total known visits.      Recent Emergency Department Visit Summary  Date Facility [facility] Type Diagnoses or Chief Complaint   Sep 18, 2018 [place] [facility] [place] [place] [facility]      stress      Aug 22, 2018 [place] [facility] [place] [place] [facility]      SOB      Shortness of Breath      Myalgia, unspecified site      Jul 31, 2018 [facility] [place] [facility] [place] [facility]      Depression      Psychiatric Evaluation      Major depressive disorder, single episode, unspecified      Homelessness      Jul 30, 2018 [facility] [place] [facility] [place] [facility]      Leg Pain       Homelessness      Pain in left ankle and joints of left foot      Pain in right ankle and joints of right foot      Jul 25, 2018 [place] [facility] [place] [facility]      Homelessness      Headache      Anxiety disorder, unspecified          Recent Inpatient Visit Summary  No recorded inpatient visits.     Care Team  There is not a care team on record at this time.   Collective Portal  This patient has registered at the [facility] Emergency Department   For more information visit: [email or web address]     PLEASE NOTE:     1.   Any care recommendations and other clinical information are provided as guidelines or for historical purposes only, and providers should exercise their own clinical judgment when providing care.    2.   You may only use this information for purposes of treatment, payment or health care operations activities, and subject to the limitations of applicable  Collective Policies.    3.   You should consult directly with the organization that provided a care guideline or other clinical history with any questions about additional information or accuracy or completeness of information provided.    ? 2020 [employer] - https://craig.com/

## 2018-09-18 NOTE — Progress Notes (Signed)
SW assistance requested to provide patient with a list of homeless shelters. SW spoke with patient at bedside. Patient reports that he needs resources and reports that he intends to stay in [facility]. SW provided patient with the [employer] information. Patient reports that he will use the phone in the lobby to call. SW will continue to assist and support as needed.           Everardo Pacific, MSW  Emergency Department Social Worker  [facility]  [phone]  [email or web address]

## 2018-09-18 NOTE — ED Notes (Signed)
[name] and [name]

## 2018-09-18 NOTE — ED Notes (Signed)
Attempted to wake up patient for [facility] interview. Cart # 2 in room, connected and ready to go. Pt not interested in interviewing at the moment, stated he needs to sleep a little more. Will attempt for breakfast.

## 2018-09-18 NOTE — Progress Notes (Signed)
05:20 AM    Liaison tried to do the psych evaluation but patient was sleeping. Liaison informed the nurse that when patient wakes up to call central access to do the assessment.

## 2018-09-18 NOTE — ED Notes (Signed)
Pt up for breakfast. [facility] called for interview. This RN notified Cart #2 is in place connected and ready for behavioral health assessment.

## 2018-09-18 NOTE — ED Notes (Signed)
Pt given food and washing toiletries for him to wash himself. Pt working on that and will be on his way.

## 2018-09-18 NOTE — EDIE (Signed)
COLLECTIVE?NOTIFICATION?09/18/2018 01:54?BLAYDE, BACIGALUPI B?MRN: [id number]    Criteria Met      5 ED Visits in 12 Months    Security and Safety  No recent Security Events currently on file    ED Care Guidelines  There are currently no ED Care Guidelines for this patient. Please check your facility's medical records system.    Flags      Negative COVID-19 Lab Result - [facility] - A specimen collected from this patient was negative for COVID-19 / Attributed By: [place] Department of [facility] / Attributed On: 08/27/2018       Prescription Monitoring Program  000??- Narcotic Use Score  000??- Sedative Use Score  000??- Stimulant Use Score  000??- Overdose Risk Score  - All Scores range from 000-999 with 75% of the population scoring < 200 and on 1% scoring above 650  - The last digit of the narcotic, sedative, and stimulant score indicates the number of active prescriptions of that type  - Higher Use scores correlate with increased prescribers, pharmacies, mg equiv, and overlapping prescriptions  - Higher Overdose Risk Scores correlate with increased risk of unintentional overdose death   Concerning or unexpectedly high scores should prompt a review of the PMP record; this does not constitute checking PMP for prescribing purposes.      E.D. Visit Count (12 mo.)  Facility Visits   [facility] 3   [facility] 2   Total 5   Note: Visits indicate total known visits.      Recent Emergency Department Visit Summary  Date Facility [facility] Type Diagnoses or Chief Complaint   Sep 18, 2018 [place] [facility] [place] [place] [facility]      Depression      Aug 22, 2018 [facility] [place] [facility]      SOB      Shortness of Breath      Myalgia, unspecified site      Jul 31, 2018 [facility] [place] [facility] [place] [facility]      Depression      Psychiatric Evaluation      Major depressive disorder, single episode, unspecified      Homelessness      Jul 30, 2018 [facility] [place] [facility] [place] [facility]      Leg Pain       Homelessness      Pain in left ankle and joints of left foot      Pain in right ankle and joints of right foot      Jul 25, 2018 [place] [facility] [place] [facility]      Homelessness      Headache      Anxiety disorder, unspecified          Recent Inpatient Visit Summary  No recorded inpatient visits.     Care Team  There is not a care team on record at this time.   Collective Portal  This patient has registered at the [facility] Emergency Department   For more information visit: [email or web address]     PLEASE NOTE:     1.   Any care recommendations and other clinical information are provided as guidelines or for historical purposes only, and providers should exercise their own clinical judgment when providing care.    2.   You may only use this information for purposes of treatment, payment or health care operations activities, and subject to the limitations of applicable  Collective Policies.    3.   You should consult directly with the organization that provided a care guideline or other clinical history with any questions about additional information or accuracy or completeness of information provided.    ? 2020 [employer] - https://craig.com/

## 2018-09-18 NOTE — Progress Notes (Signed)
. Psychiatric Evaluation Part I    [name] is a 43 y.o. male admitted to the [facility] Emergency Department who was seen via Telepsych on 09/18/2018 by Park Meo.    Call Details  Patient Location: [facility] ED  Patient Room Number: N33  Time contacted by ED Physician: 0200  Time consult began: 0910  Time (in minutes) from Call to Consult: 430  Time consult concluded: 0934  Referring ED Department  Emergency Department: [facility]        Discharge Planning  Living Arrangements: Other (Comment)  Support Systems: None  Type of Residence: Homeless  Patient expects to be discharged to:: discharge, patient refused any resources    Presenting Mental Status  Orientation Level: Oriented X4  Memory: No Impairment  Thought Content: normal  Thought Process: normal  Behavior: bizarre  Consciousness: Alert  Impulse Control: normal  Perception: normal  Eye Contact: normal  Attitude: uncooperative  Mood: irritable  Hopelessness Affects Goals: No  Hopelessness About Future: No  Affect: angry  Speech: normal  Concentration: impaired  Insight: fair  Judgment: fair  Appearance: normal  Appetite: normal  Weight change?: normal  Energy: normal  Sleep: normal  Reliability of Reporter/Patient: questionable     1) Have you wished you were dead or wished you could go to sleep and not wake up?no  2) Have you actually had any thoughts of killing yourself? no  If yes to 2, ask questions 3,4,5 and 6. If NO to 2, go directly to question 6.  3) Have you been thinking about how you might do this?n/a  4) Have you had these thoughts and had some intention of acting on them?n/a  5) Have you started to work out or worked out the details of how to kill yourself? Do you intend to carry out this plan? n/a  6) Have you ever done anything, started to do anything, or prepared to do anything to end your life? If yes, was this within the past three months?no       Justification for level of risk identified based on TSAR results: patient denied any  current SI/HI/AVH. Patient told Clinical research associate that he does not want complete the assessment and ask writer to end the interview.      Within the Last 6 Months:: (unable to assess as patient declined to complete the interview)  Greater than 6 Months Ago:: (unable to assess as patient declined to complete the interview)              Substance Abuse History  Previous Substance Abuse Treatment?: (unable to assess as patient declined to complete the interview)  Recovery and Support Involvement  Support Systems: None     Past Withdrawal Symptoms  History of Blackouts?: No  History of Withdrawal Seizures?: No    Preliminary Diagnosis #1:  F31.9 Bipolar disorder, unspecified           Violence Toward Others  Within the Last 6 Months:: (unable to assess as patient declined to complete the interview)  Greater than 6 Months Ago:: (unable to assess as patient declined to complete the interview)     Preliminary Diagnosis (DSM IV)  Axis I:  F31.9 Bipolar disorder, unspecified  Axis II: Deferred  Axis III: See ED Provider Note  Axis IV: Social environment, Housing, Access to health care services, Primary support group        Summary: Patient is a 43 year old AA Male with PMHx of Bipolar disorder presenting to  the ED due to stress related issues. Per report from the ED Provider note, "Patient with history of bipolar, questionable schizophrenia; noncompliant with medication for over 4 weeks; presents to the ER because he feels" very stressed"'s.  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  Patient is currently not on domiciled.  Denies hearing any voices.  Denies any suicidal or homicidal ideation".    Patient was very irritable and upset during this assessment and had the bed sheet over his head most of the time. After several prompts from writer asking patient the reason for his ED visit, Patient told writer that he is stressed and that no one seem to want to help him. When asked to elaborate on  what kind of help his seeking, patient did not give a response. When asked if he was having any suicidal/homicidal ideations, patient responded "No". When asked if he was having any type of hallucinations, patient responded "no". Patient was asked if is on type of medications and patient responded "yes" but says that he does not remember the names of his medications. Patient told Clinical research associate that its been a couple of weeks since he last took his medications. Patient then abruptly asked writer to end the interview saying " I'll talk to you later. I don't want to talk anymore". Writer made multiple attempts to engage with the patient but patient did not respond to any of the writer's questions as he demonstrated by putting his bed sheet over his head and turned his back on Clinical research associate. Writer concluded the assessment.     Disposition: Clinical research associate discussed with ED Provider Dr. Linton Rump detailing what occurred during the assessment. Writer explained to the ED provider that patient has declined to continues the interview and that Clinical research associate will conclude the assessment. Patient denied any SI/HI/AVH and did not endorse any safety concerns.     Justification for disposition:  patient denied any current SI/HI/AVH. Patient told Clinical research associate that he does not want complete the assessment and ask writer to end the interview.      If patient is voluntarily admitted to an [facility] inpatient psychiatric unit and decides to leave AMA within the first 8 hours on the unit, is there an identified petitioner?N/A    Name of Petitioner:n/a  Contact Information:n/a  If no petitioner is identified, please explain why not: n/a    Insurance Pre-authorization information:none reported    Was consent for voluntary admission obtain and scanned into EPIC? n/a       By whom? n/a      Park Meo, NCC, [facility]    [facility]  [address]  [place], [place] [address]  [phone]

## 2018-09-18 NOTE — Progress Notes (Signed)
SW received a call from RN in the lobby stating that patient appears to be making phone calls but may need some assistance. RN reported that patient appeared to be muttering to himself. SW went out to the lobby to assist, patient had left the ER lobby.                Everardo Pacific, MSW  Emergency Department Social Worker  [facility]  [phone]  [email or web address]

## 2018-09-18 NOTE — Discharge Instructions (Signed)
Depression    After evaluation, your doctor feels that you are suffering from depression.    Depression is an illness that can make you feel guilty or worthless. It can make it hard to concentrate or make you lose interest in things you usually enjoy. It may also affect your ability to eat, sleep, and function like normal. Depression is caused by a chemical imbalance in the brain that affects mood. Depression can be treated!   The diagnosis of depression is given when these symptoms are present every day for at least 2 weeks.    It is important to follow up with a counselor as well as your family doctor. If you do not have an appointment in the next 2 to 3 days, call and make one. It is VERY IMPORTANT that your counselor and family doctor know that your problems are getting worse.    It is important to have a counselor and a family doctor who see you on a regular basis. They can help you with your problem, keep a close eye on you and follow your progress. You have been given a list of phone numbers to call if you feel that you need to talk to someone before your regular appointment.    Drinking alcohol can make your depression worse. Do not drink alcohol while taking antidepressant medication.    YOU SHOULD SEEK MEDICAL ATTENTION IMMEDIATELY, EITHER HERE OR AT THE NEAREST EMERGENCY DEPARTMENT, IF ANY OF THE FOLLOWING OCCURS:   You do not feel safe in your home environment.   You think of harming yourself or suicide.   You think of harming others.   You become worse and feel that you cannot wait until your follow-up appointment for treatment.

## 2018-09-18 NOTE — ED Notes (Signed)
Called [facility] to make sure whether or not the interview was conducted. Central Access notified me that they were getting ready to do so now.

## 2018-09-18 NOTE — ED Notes (Signed)
Pt sleeping while central access called. Will call call back central access to try again in one hour. MD aware

## 2018-09-18 NOTE — ED Provider Notes (Signed)
43 y/o M presenting with significant stress and requesting psychiatric evaluation.    O2 sat-           saturation: 100 %; Oxygen use: room air; Interpretation: Normal       EKG Interpretation  Reviewed and Interpreted by ER physician, Idamae Schuller, MD   Rhythm: Sinus  Rate: 79  Intervals/conduction: Normal intervals  ST Segments/T wave: No ischemic appearing ST elevations/depressions. No T wave changes.          The patient was seen and examined by PA or Fellow; plan of care was discussed with me. I have also seen and examined the patient personally and concur with PA/NP plan of care.     Idamae Schuller, MD  7:59 PM  09/18/2018           Virl Cagey, MD  09/18/18 2000

## 2018-09-18 NOTE — ED Provider Notes (Signed)
9:30 AM patient evaluated by central access patient denying suicidal homicidal ideation and stated he does not need to be admitted psychiatrically patient hemodynamically stable will be discharged home     Shara Blazing, MD  09/18/18 [phone]

## 2018-09-23 ENCOUNTER — Emergency Department
Admission: EM | Admit: 2018-09-23 | Discharge: 2018-09-23 | Disposition: A | Discharge status: Home / Self Care | Payer: Self-pay | Attending: Emergency Medical Services | Admitting: Emergency Medical Services

## 2018-09-23 ENCOUNTER — Telehealth: Payer: Self-pay | Admitting: Clinical

## 2018-09-23 DIAGNOSIS — M79604 Pain in right leg: Secondary | ICD-10-CM | POA: Insufficient documentation

## 2018-09-23 DIAGNOSIS — F439 Reaction to severe stress, unspecified: Secondary | ICD-10-CM | POA: Insufficient documentation

## 2018-09-23 DIAGNOSIS — Z590 Homelessness: Secondary | ICD-10-CM | POA: Insufficient documentation

## 2018-09-23 DIAGNOSIS — F17200 Nicotine dependence, unspecified, uncomplicated: Secondary | ICD-10-CM | POA: Insufficient documentation

## 2018-09-23 DIAGNOSIS — F3162 Bipolar disorder, current episode mixed, moderate: Secondary | ICD-10-CM

## 2018-09-23 DIAGNOSIS — F319 Bipolar disorder, unspecified: Secondary | ICD-10-CM | POA: Insufficient documentation

## 2018-09-23 MED ORDER — IBUPROFEN 400 MG PO TABS
800.00 mg | ORAL_TABLET | Freq: Once | ORAL | Status: AC
Start: 2018-09-23 — End: 2018-09-23
  Administered 2018-09-23: 04:00:00 800 mg via ORAL
  Filled 2018-09-23: qty 2

## 2018-09-23 NOTE — Progress Notes (Signed)
Psychiatric Evaluation Part I    [name] is a 43 y.o. male admitted to the [facility] Emergency Department who was seen via Telepsych on 09/23/2018 by Sherre Lain, LCSW.    Call Details  Patient Location: [facility] ED  Patient Room Number: A04  Time contacted by ED Physician: [phone]  Time consult began: 0450  Time (in minutes) from Call to Consult: 68  Time consult concluded: 0515  Referring ED Department  Emergency Department: [facility]        Discharge Planning  Living Arrangements: Alone  Support Systems: None  Type of Residence: Homeless  Patient expects to be discharged to:: d/c home    Presenting Mental Status  Orientation Level: Oriented X4  Memory: No Impairment  Thought Content: normal  Thought Process: normal  Behavior: slowed  Consciousness: Confused  Impulse Control: impaired  Perception: normal  Eye Contact: none  Attitude: uncooperative  Mood: irritable  Hopelessness Affects Goals: No  Hopelessness About Future: No  Affect: blunted  Speech: impoverished  Concentration: impaired  Appearance: unkempt  Appetite: decreased  Weight change?: normal  Energy: decreased  Sleep: normal  Reliability of Reporter/Patient: fair    CSSR-S:  1) Have you wished you were dead or wished you could go to sleep and not wake up?No  2) Have you actually had any thoughts of killing yourself? No  If yes to 2, ask questions 3,4,5 and 6. If NO to 2, go directly to question 6.  3) Have you been thinking about how you might do this?No  4) Have you had these thoughts and had some intention of acting on them?No  5) Have you started to work out or worked out the details of how to kill yourself? Do you intend to carry out this plan? No  6) Have you ever done anything, started to do anything, or prepared to do anything to end your life? If yes, was this within the past three months?No             Within the Last 6 Months:: no history of violence toward self  Greater than 6 Months Ago:: no history of violence toward self               Substance Abuse History  Previous Substance Abuse Treatment?: No  Recovery and Support Involvement  Current Involvement: None  Past Involvement: None  Support Systems: None          Preliminary Diagnosis #1: Bipolar Disorder,current episode mixed moderate F 31.62           Violence Toward Others  Within the Last 6 Months:: no history of violence toward others  Greater than 6 Months Ago:: no history of violence toward others     Preliminary Diagnosis (DSM IV)  Axis I: Bipolar Disorder,current episode mixed moderate F31.62  Axis II: deferred  Axis III: none  Axis IV: Housing  Axis V on Admission: 60        Summary:  Pt is a 43 yo male. Patient presents to the ED saying that he has stress related problems. Patient denies any SI/HI or plan. Patient denies any hx of suicide attempts. Patient denies any hallucinations. Patient's sleep is normal. Patient's appetite has decreased. Patient's weight is normal. Patient's energy is low. Patient does not have any stated goal. Patient said that he was diagnosed with manic depressive bipolar disorder. Patient denies any previous hospitalization. Patient does not have any psychiatrist or therapist. Patient denies any  medication intake. Patient said that his sisters has been diagnosed with depression. Patient said that he has been arrested from 5-10 times. Patient denies any medical hx. Patient denies any substance abuse. Patient said that he is living alone. Patient is not working. Patient did not want to say the reasons of his stress. Patient did not want to say if he has any support systems.    Presenting Problem: pt said that he has some stress related problems    Current Major Stressors: patient did not want to say his stressors    Current Providers: Patient denies    Current Medications: patient denies    Current Medical Issues: patient denies     Treatment History:  Patient said that he was diagnosed with Bipolar disorder    Substance use: patient denies    Legal  History: 5 to 10 times arrests    Violence/Aggression: patient denies    Living situation: patient said that he lives alone.    Supports:  Patient did not said    Disposition: Patient will be medically clear by Berna Bue PA. Patient will be discharged to his home, since he denies any SI/HI or plan. Patient contracted for safety.    Justification for disposition: Patient denies any SI/HI or plan. Patient does not meet criteria for inpatient.    If patient is voluntarily admitted to an [facility] inpatient psychiatric unit and decides to leave AMA within the first 8 hours on the unit, is there an identified petitioner?N/A    Name of Petitioner:N/A  Contact Information:N/A  If no petitioner is identified, please explain why not: patient will be discharged  home    Insurance Pre-authorization information:n/a d/c home    Was consent for voluntary admission obtain and scanned into EPIC? n/a       By whom? n/a      Sherre Lain, LCSW    [facility]  [address]  [place], [place] [address]  [phone]

## 2018-09-23 NOTE — ED Notes (Signed)
Bed: A04  Expected date:   Expected time:   Means of arrival:   Comments:  Medic 416

## 2018-09-23 NOTE — ED Triage Notes (Signed)
Patient presents via EMS, found outside on bench, homeless.  Complaints of Right leg pain x 4 days and exposure to elements/cold.

## 2018-09-23 NOTE — ED Notes (Signed)
Wt called ER and requested telepsych be placed in pt room.

## 2018-09-23 NOTE — EDIE (Signed)
COLLECTIVE?NOTIFICATION?09/23/2018 03:09?TEVITA, GOMER B?MRN: [id number]    Criteria Met      5 ED Visits in 12 Months    Security and Safety  No recent Security Events currently on file    ED Care Guidelines  There are currently no ED Care Guidelines for this patient. Please check your facility's medical records system.    Flags      Negative COVID-19 Lab Result - [facility] - A specimen collected from this patient was negative for COVID-19 / Attributed By: [place] [facility] / Attributed On: 08/27/2018       Prescription Monitoring Program  000??- Narcotic Use Score  000??- Sedative Use Score  000??- Stimulant Use Score  000??- Overdose Risk Score  - All Scores range from 000-999 with 75% of the population scoring < 200 and on 1% scoring above 650  - The last digit of the narcotic, sedative, and stimulant score indicates the number of active prescriptions of that type  - Higher Use scores correlate with increased prescribers, pharmacies, mg equiv, and overlapping prescriptions  - Higher Overdose Risk Scores correlate with increased risk of unintentional overdose death   Concerning or unexpectedly high scores should prompt a review of the PMP record; this does not constitute checking PMP for prescribing purposes.      E.D. Visit Count (12 mo.)  Facility Visits   [facility] 4   [facility] 2   Total 6   Note: Visits indicate total known visits.      Recent Emergency Department Visit Summary  Date Facility [facility] Type Diagnoses or Chief Complaint   Sep 23, 2018 [facility] [place] [place] [facility]      Leg Pain      Sep 18, 2018 [facility] [place] [facility]      Depression      Major depressive disorder, single episode, unspecified      Aug 22, 2018 [facility] [place] [facility]      SOB      Shortness of Breath      Myalgia, unspecified site      Jul 31, 2018 [facility] [place] [facility]      Depression      Psychiatric Evaluation      Major depressive  disorder, single episode, unspecified      Homelessness      Jul 30, 2018 [facility] [place] [facility]      Leg Pain      Homelessness      Pain in left ankle and joints of left foot      Pain in right ankle and joints of right foot      Jul 25, 2018 [facility] [facility] [place] [facility]      Homelessness      Headache      Anxiety disorder, unspecified          Recent Inpatient Visit Summary  No recorded inpatient visits.     Care Team  There is not a care team on record at this time.   Collective Portal  This patient has registered at the [facility] Emergency Department   For more information visit: [email or web address]     PLEASE NOTE:     1.   Any care recommendations and other clinical information are provided as guidelines or for historical purposes only, and providers should exercise their own clinical  judgment when providing care.    2.   You may only use this information for purposes of treatment, payment or health care operations activities, and subject to the limitations of applicable [employer] Polic[employer].    3.   You should consult directly with the organization that provided a care guideline or other clinical history with any questions about additional information or accuracy or completeness of information provided.    ? 2020 [employer] - https://craig.com/

## 2018-09-23 NOTE — Discharge Instructions (Signed)
Bipolar Disorder (Depressed Episode)     You were seen today because you were having a depressed episode. This mood change is because of Bipolar Disorder.    People with Bipolar Disorder have high and low moods that are not normal. These moods can cause them trouble in their day-to-day life. A depressive episode is when a person notices that they are very sad, feel tired and lose interest in everything. This happens for no reason. This typically may last for a week or more. It may last a shorter period of time.    People who have a depressed episode may have these symptoms:   Feeling sad most of the day, every day, for two weeks or more.   No enjoyment from things they usually like.   Trouble falling or staying asleep, or wanting to sleep all the time.   Low energy during the day.   Not wanting to eat or wanting to eat all the time.   Feeling guilty about things that aren't their fault.   Moving slowly, like under-water, or not being able to sit still.   Wishing they were dead or planning to hurt or kill themselves.   Trouble paying attention or focusing.     There are medicines that can help treat some of the symptoms of depression. These medication can be prescribed by your primary care doctor or by the referral psychiatrist. They can include one or more of the following:   Fluoxetine (Prozac) paroxetine (Paxil), sertraline (Zoloft), citalopram (Celexa), escitalopram (Lexapro), venlafaxine (Effexor), desvenlafaxine (Pristiq), duloxetine (Cymbalta), bupropion (Wellbutrin), mirtazapine (Remeron) .     Lithium.   Divalproic acid (Depakote ER), carbamazepine (Tegretol), lamotrigene (Lamictal).     Risperidone (Risperdal), quetiapine (Seroquel), olanzapine (Zyprexa), aripiprazole (Abilify), ziprasidone (Geodon), paliperidone (Invega).    Most of these medicines are taken 1 or more times daily. Some must be taken with meals. Some are given as a shot once or twice a month. Ask your  doctor and your pharmacy about how to take your medicines.     Never take more medicine than you are supposed to without asking your doctor. Don't skip doses. Some medicines can make you hungrier. Always eat a balanced diet.    Good sleep is important for a good mood. Going without sleep may make depressive episodes worse. It can also cause manic episodes. Always get 6-8 hours of sleep each night.    It is very important that you follow up with your primary care doctor and your counselor, or the referral doctors/therapists.    Though we don't believe your condition is serious right now, it is important to be careful. Sometimes a problem that seems mild can become serious later. This is why it is very important that you return here or go to the nearest Emergency Department if you are not improving or your symptoms are getting worse.    You should contact your doctor or the referral therapist (or psychiatrist) if you have any of the following:   If you have questions or concerns about your condition, treatment, or care.   If you don't feel better as expected.   If you are sad or tearful too much.   If you don't enjoy things that you would normally like.   If you see or hear things when others can't (hallucinations).   If you are not eating or are losing weight.    YOU SHOULD SEEK MEDICAL ATTENTION IMMEDIATELY, EITHER HERE OR AT THE NEAREST EMERGENCY DEPARTMENT, IF   ANY OF THE FOLLOWING OCCUR:   You have thoughts about hurting yourself or others. The National Suicide Hotline number is 1-800-SUICIDE 430-115-0237), or 1-800-273-TALK (8255).     If you feel that your problem is becoming out of control and you need to see a doctor or a psychiatrist/psychologist now.   You can also call the local crisis hotline at 2-1-1 or go to the website https://mckenzie.com/ to seek help.    If you can't follow up with your doctor, or if at any time you feel you need to be rechecked or seen  again, come back here or go to the nearest emergency department.     Thank you for choosing [facility] for your emergency care needs.  We strive to provide EXCELLENT care to you and your family.      If you do not continue to improve or your condition worsens, please contact your doctor or return immediately to the Emergency Department.    ONSITE PHARMACY  Our full service onsite pharmacy is a 2 minute walk from the ER.  Open Mon to Fri from 8 am to 8 pm, Sat and Sun 9 am to 5 pm. Ask an ED staff member for directions.  We accept all major insurances and prices are competitive with major retailers.  Ask your provider to print your prescriptions down to the pharmacy to speed you on your way home.      DOCTOR REFERRALS  Call [phone] (available 24 hours a day, 7 days a week) if you need any further referrals and we can help you find a primary care doctor or specialist.  Also, available online at:  [email or web address]    YOUR CONTACT INFORMATION  Before leaving please check with registration to make sure we have an up-to-date contact number.  You can call registration at [phone] to update your information.  For questions about your hospital bill, please call [phone].  For questions about your Emergency Dept Physician bill please call [phone].      FREE HEALTH SERVICES  If you need help with health or social services, please call 2-1-1 for a free referral to resources in your area.  2-1-1 is a free service connecting people with information on health insurance, free clinics, pregnancy, mental health, dental care, food assistance, housing, and substance abuse counseling.  Also, available online at:  http://www.[place].org    MEDICAL RECORDS AND TESTS  Certain laboratory test results do not come back the same day, for example urine cultures.   We will contact you if other important findings are noted.  Radiology films are often reviewed again to ensure  accuracy.  If there is any discrepancy, we will notify you.      Please call [phone] to pick up a complimentary CD of any radiology studies performed.  If you or your doctor would like to request a copy of your medical records, please call [phone].          ORTHOPEDIC INJURY   Please know that significant injuries can exist even when an initial x-ray is read as normal or negative.  This can occur because some fractures (broken bones) are not initially visible on x-rays.  For this reason, close outpatient follow-up with your primary care doctor or bone specialist (orthopedist) is required.    MEDICATIONS AND FOLLOWUP  Please be aware that some prescription medications can cause drowsiness.  Use caution when driving or operating machinery.    The examination  and treatment you have received in our Emergency Department is provided on an emergency basis, and is not intended to be a substitute for your primary care physician.  It is important that your doctor checks you again and that you report any new or remaining problems at that time.      24 HOUR PHARMACIES  [facility] [address] [place], [place] [address] (1.4 miles, 7 minutes)  Handout with directions available on request      ASSISTANCE WITH INSURANCE    Affordable Care Act  [facility])  Call to start or finish an application, compare plans, enroll or ask a question.  [phone]  TTY: [phone]  Web:  Healthcare.gov    Help Enrolling in [facility]  Cover [place]  (587)757-7011 (TOLL-FREE)  [phone] (TTY)  Web:  Http://www.coverva.org    Local Help Enrolling in the [facility]  [place] [facility]  [phone] (MAIN)  Email:  health-help@nvfs .org  Web:  BlackjackMyths.is  Address:  [address], [address] [place], [place] [address]

## 2018-09-23 NOTE — ED Provider Notes (Signed)
[facility]      EMERGENCY DEPARTMENT HISTORY AND PHYSICAL EXAM    Patient Name: [name], [name]  Patient DOB:  Dec 16, 1975  MRN:  [id number]  Room:  04/A04  Rendering Provider: Tresea Mall, PA-C    History of Presenting Illness     Chief Complaint:   Chief Complaint   Patient presents with    Leg Pain    Cold Exposure     Mechanism of Injury:       Historian:  Patient   Onset:   4 days   Severity:  Mild      43 y.o. male h/o bipolar disorder presents with right leg pain and being cold. Pt is currently homeless and states he was exposed to the cold outside tonight. Since the weather turned colder he has been having right leg pain, pt states pain is worse with the cold hits his skin. He was seen at [facility] 5 days ago for being "stressed out" and walked out of the interview half way through. Tonight he states he still feels stressed out and would like to talk to the psych team. He denies SI/HI. Denies injury to leg. No other complaints. He just keeps stating "I just need a pain pill".     PMD:  Pcp, None, MD    Past Medical History     Past Medical History:   Diagnosis Date    Manic bipolar I disorder        Past Surgical History     History reviewed. No pertinent surgical history.    Family History     History reviewed. No pertinent family history.    Social History     Social History     Socioeconomic History    Marital status: Single     Spouse name: Not on file    Number of children: Not on file    Years of education: Not on file    Highest education level: Not on file   Occupational History    Not on file   Social Needs    Financial resource strain: Not on file    Food insecurity     Worry: Not on file     Inability: Not on file    Transportation needs     Medical: Not on file     Non-medical: Not on file   Tobacco Use    Smoking status: Current Every Day Smoker    Smokeless tobacco: Never Used   Substance and Sexual Activity    Alcohol use: Yes    Drug use: Never     Sexual activity: Not on file   Lifestyle    Physical activity     Days per week: Not on file     Minutes per session: Not on file    Stress: Not on file   Relationships    Social connections     Talks on phone: Not on file     Gets together: Not on file     Attends religious service: Not on file     Active member of club or organization: Not on file     Attends meetings of clubs or organizations: Not on file     Relationship status: Not on file    Intimate partner violence     Fear of current or ex partner: Not on file     Emotionally abused: Not on file     Physically abused: Not on file  Forced sexual activity: Not on file   Other Topics Concern    Not on file   Social History Narrative    Not on file       Allergies     No Known Allergies    Home Medications     Home medications reviewed by PA at 4:19 AM    No current facility-administered medications for this encounter.   No current outpatient medications on file.    ED Medications Administered     ED Medication Orders (From admission, onward)    Start Ordered     Status Ordering Provider    09/23/18 [phone] 09/23/18 0342  ibuprofen (ADVIL) tablet 800 mg  Once     Route: Oral  Ordered Dose: 800 mg     Last MAR action: Given Marguis Mathieson SIMONS          Review of Systems     Constitutional: Negative for fever and chills.   Respiratory: Negative for shortness of breath.   Cardiovascular: Negative for chest pain.   MS: Positive for leg pain.   Skin: Negative for rash.   Psych: Positive for "stress". Negative for SI/HI   All other systems reviewed and negative     VS     Patient Vitals for the past 24 hrs:   BP Temp Temp src Pulse Resp SpO2 Weight   09/23/18 0311 117/75 97 F (36.1 C) Oral 83 16 100 % 104.7 kg       Physical Exam     Constitutional: Vital signs reviewed. Disheveled, smells of smoke.  Head: Normocephalic, atraumatic  Eyes: Conjunctiva and sclera are normal.  No injection or discharge.  Neck: Normal range of motion. Trachea midline.   Respiratory/Chest: No respiratory distress.   Neurological: No focal motor deficits by observation. Speech slurred.  MS: Right lower leg: no tenderness. Multiple excoriations to right outer leg. Gait normal. No bony tenderness or deformity. No calf tenderness. No erythema or warmth. 2+dp.  Skin: Warm and dry. No rash.  Psychiatric: Alert and conversant.  Flat, bizarre affect.  Poor insight.     Data Review     Nursing notes reviewed and agree: Yes    Diagnostic Study Results     Labs:     Results     ** No results found for the last 24 hours. **          Radiologic Studies:  Radiology Results (24 Hour)     ** No results found for the last 24 hours. **      .    EKG:      Monitor:      Procedures         MDM and Clinical Notes     Pt presents with stress and is homeless. He reports leg pain, but states he feels pain when cold hits his legs as he is wearing shorts. It is 50 degrees outside. No evidence of trauma and pt denies injury. Exam not consistent with dvt. Offered xray, which pt declines. He states he just needs a pain pill, given ibuprofen. He is also requesting to talk to psych again. He doesn't seem suicidal, however has bizarre behavior with psych history. Will have psych evaluate him. He is refusing labs, but just had full work up at [facility] 5 days ago.     Pt spoke with psych, they do not feel he needs inpatient admission as he is not si and no hi. He is agreeable with  [place]. Psych states he told them he just wanted to come in and have a place to sleep for a while and get some food as he was hungry and tired. Pt advised to take tylenol/motrin prn leg pain. Use lotion and avoid cold weather. Return instructions given. Pt advised to return to ER for worsening symptoms or other concerns. Pt ambulated from dept without difficulty upon discharge in no distress.          The patient was seen and evaluated during the time of COVID pandemic.  Significant limitations can be present in the evaluation and management of ED  patients during pandemic conditions, including but not limited to lack of testing, rapidly changing IHS protocols, limited evaluation and follow-up resources.       Diagnosis and Disposition     Clinical Impression  1. Pain of right lower extremity    2. Stress    3. Bipolar affective disorder, remission status unspecified        Disposition  ED Disposition     ED Disposition Condition Date/Time Comment    Discharge  Sun Sep 23, 2018  5:23 AM Laurence Slate discharge to home/self care.    Condition at disposition: Stable          Prescriptions    New Prescriptions    No medications on file          Roselee Culver, [place]  09/23/18 8295       Chase Picket, MD  09/23/18 [phone]

## 2018-10-02 ENCOUNTER — Emergency Department
Admission: EM | Admit: 2018-10-02 | Discharge: 2018-10-03 | Disposition: A | Discharge status: Home / Self Care | Payer: PRIVATE HEALTH INSURANCE | Attending: Emergency Medical Services | Admitting: Emergency Medical Services

## 2018-10-02 ENCOUNTER — Emergency Department: Payer: PRIVATE HEALTH INSURANCE

## 2018-10-02 DIAGNOSIS — F17200 Nicotine dependence, unspecified, uncomplicated: Secondary | ICD-10-CM | POA: Insufficient documentation

## 2018-10-02 DIAGNOSIS — M791 Myalgia, unspecified site: Secondary | ICD-10-CM | POA: Insufficient documentation

## 2018-10-02 DIAGNOSIS — Z590 Homelessness: Secondary | ICD-10-CM | POA: Insufficient documentation

## 2018-10-02 NOTE — ED Provider Notes (Signed)
EMERGENCY DEPARTMENT HISTORY AND PHYSICAL EXAM    Date: 10/03/18  Patient Name: [name]  Attending Physician: Blanche East, MD  Patient DOB:  Jun 28, 1975  MRN:  [id number]  Room:  34/SS 34        History of Presenting Illness     Chief Complaint:    Chief Complaint   Patient presents with   . Fever   . Generalized Body Aches       Historian: Patient    43 y.o. male   Location: Whole body  Onset: At rest  Severity: Moderate  Duration: 1 day  Associated signs/symptoms:     43 year old male homeless presents with 1 day history of generalized body aches subjective fevers.  No cough no vomiting no diarrhea.  No chest pain no shortness of breath.  Patient says "he just needs a place to stay" Of note it is raining and cold outside.        PMD: Pcp, None, MD    Past Medical History     Past Medical History:   Diagnosis Date   . Manic bipolar I disorder        Past Surgical History     History reviewed. No pertinent surgical history.    Family History     History reviewed. No pertinent family history.    Social History     Social History     Socioeconomic History   . Marital status: Single     Spouse name: Not on file   . Number of children: Not on file   . Years of education: Not on file   . Highest education level: Not on file   Occupational History   . Not on file   Social Needs   . Financial resource strain: Not on file   . Food insecurity     Worry: Not on file     Inability: Not on file   . Transportation needs     Medical: Not on file     Non-medical: Not on file   Tobacco Use   . Smoking status: Current Every Day Smoker   . Smokeless tobacco: Never Used   Substance and Sexual Activity   . Alcohol use: Yes   . Drug use: Never   . Sexual activity: Not on file   Lifestyle   . Physical activity     Days per week: Not on file     Minutes per session: Not on file   . Stress: Not on file   Relationships   . Social Wellsite geologist on phone: Not on file     Gets together: Not on file     Attends religious service: Not  on file     Active member of club or organization: Not on file     Attends meetings of clubs or organizations: Not on file     Relationship status: Not on file   . Intimate partner violence     Fear of current or ex partner: Not on file     Emotionally abused: Not on file     Physically abused: Not on file     Forced sexual activity: Not on file   Other Topics Concern   . Not on file   Social History Narrative   . Not on file       Allergies     No Known Allergies    Review of Systems     Constitutional:  No fever  Eyes: No discharge   ENT: No ST  CV:  No CP   Resp:  No SOB or cough  GI: No abd pain, N, V, D  GU: No dysuria  MS: Body aches  Skin: No rash  Neuro:  No HA  Psych:  No behavior changes  All other systems reviewed and negative    Physical Exam     CONSTITUTIONAL Patient is afebrile, Vital signs reviewed.  HEAD Atraumatic, Normocephalic.  EYES No discharge from eyes, Sclera are normal.  NECK   Normal ROM, Cervical spine nontender  RESPIRATORY CHEST Chest is nontender, Breath sounds normal, No respiratory distress.  CARDIOVASCULAR RRR, Heart sounds normal.  ABDOMEN Abdomen is nontender, No peritoneal signs, No distension  BACK   There is no CVA tenderness, There is no tenderness to palpation  UPPER EXTREMITY No cyanosis, No edema  LOWER EXTREMITY No cyanosis, No edema  NEURO GCS is 15, No focal motor deficits, No focal sensory deficits.  SKIN Skin is warm, Skin is dry.  PSYCHIATRIC Normal affect, Normal insight        Monitors, EKG     Cardiac Monitor (interpreted by ED physician):      EKG (interpreted by ED physician):       Orders Placed During This Encounter     Orders Placed This Encounter   Procedures   . Rapid influenza A/B antigens   . COVID-19 (SARS-COV-2) ([facility] Rapid test)   . XR Chest  AP Portable   . Ambulatory referral to Transitional Care   . Isolation-Droplet   . Isolation-Contact         ED Medications Administered     ED Medication Orders (From admission, onward)    None          Data  Review   Nursing Records Reviewed and Agree: Yes      Laboratory results reviewed by ED provider: if applicable yes  Radiologic study results reviewed by ED provider:  If applicable yes    Rendering Provider: Blanche East, MD      Diagnostic Study Results     Labs     Results     ** No results found for the last 24 hours. **          Radiologic Studies  Radiology Results (24 Hour)     Procedure Component Value Units Date/Time    XR Chest  AP Portable [[id number]] Collected: 10/03/18 0029    Order Status: Completed Updated: 10/03/18 0033    Narrative:      HISTORY: fever     COMPARISON: 08/22/2018.    TECHNIQUE:  AP chest    FINDINGS:     Low inspiration with minimal ill-defined bibasilar opacities. No frank  lobar consolidation. No overt pulmonary edema. No pleural effusion or  pneumothorax.    Nonenlarged cardiomediastinal silhouette.    Unremarkable appearance of the visualized upper abdomen. No visualized  acute osseous abnormality.      Impression:           Low inspiration with minimal ill-defined bibasilar opacities which  likely represent atelectasis though infection is difficult to entirely  exclude.    Eloise Harman, MD   10/03/2018 12:31 AM      .        Procedures         Clinical Notes, Consults & Reevaluations, and MDM     For a more detailed timeline, please see ED Pt Care timeline in Epic  Chart      Differential Diagnoses:   11:20 PM Early differential includes, but is not limited to: Viral syndrome flu pneumonia COVID        Consults/Re-eval:  At 11:15 PM I have ordered labs imaging to evaluate patient symptoms    1:25 AM Re-eval:   Feeling much better, VS stable,  no acute distress, looks well.     Counseled re dx  Counseled re follow up  Answered all questions  Counseled red flags and signs and sxs to return for.  Comfortable with follow up and discharge plan    Return Precautions  The patient is aware that this evaluation is only a screening for emergent conditions related to his or her symptoms and  presentation.   I discussed the need for prompt follow-up.  Patient demonstrates verbal understanding.  Patient advised to return to the ED for any worsening symptoms, uncontrolled pain if applicable, worsening fevers, or any changes in their condition prompting concern and need for repeat evaluation and/or additional management.        MDM:   140am History, physical, imaging were done to evaluate the patient.  Patient refuses flu swab or COVID swab.  Usual benefits risks alternatives were explained.  I feel at this time based on patient's admission that he is really just looking for a place to stay given the fact that it is nighttime it is cold outside and he is homeless.  I have provided a list of homeless shelters return precautions were reviewed with patient.  Patient's  pulse ox and vital signs are normal      Diagnosis and Disposition   Diagnosis/Clinical Impression:  1. Myalgia        Disposition  ED Disposition     ED Disposition Condition Date/Time Comment    Discharge  Wed Oct 03, 2018  1:26 AM [name] discharge to home/self care.    Condition at disposition: Stable          Prescriptions    There are no discharge medications for this patient.            Critical Care     Critical care exclusive of time spent performing procedures.    Total time:          Signout If Applicable     Patient signed out to:      Signout notes:          This note was generated by the Epic EMR system/ Dragon speech recognition and may contain inherent errors or omissions not intended by the user. Grammatical errors, random word insertions, deletions, pronoun errors and incomplete sentences are occasional consequences of this technology due to software limitations. Not all errors are caught or corrected. If there are questions or concerns about the content of this note or information contained within the body of this dictation they should be addressed directly with the author for clarification.         Harden Mo,  MD  10/03/18 [phone]

## 2018-10-02 NOTE — EDIE (Signed)
COLLECTIVE?NOTIFICATION?10/02/2018 22:20?DEAARON, FULGHUM B?MRN: [id number]    Criteria Met      5 ED Visits in 12 Months    Security and Safety  No recent Security Events currently on file    ED Care Guidelines  There are currently no ED Care Guidelines for this patient. Please check your facility's medical records system.    Flags      Negative COVID-19 Lab Result - [facility] - A specimen collected from this patient was negative for COVID-19 / Attributed By: [place] Department [facility] / Attributed On: 08/27/2018       Prescription Monitoring Program  000??- Narcotic Use Score  000??- Sedative Use Score  000??- Stimulant Use Score  000??- Overdose Risk Score  - All Scores range from 000-999 with 75% of the population scoring < 200 and on 1% scoring above 650  - The last digit of the narcotic, sedative, and stimulant score indicates the number of active prescriptions of that type  - Higher Use scores correlate with increased prescribers, pharmacies, mg equiv, and overlapping prescriptions  - Higher Overdose Risk Scores correlate with increased risk of unintentional overdose death   Concerning or unexpectedly high scores should prompt a review of the PMP record; this does not constitute checking PMP for prescribing purposes.      E.D. Visit Count (12 mo.)  Facility Visits   [facility] 5   [facility] 2   Total 7   Note: Visits indicate total known visits.      Recent Emergency Department Visit Summary  Date Facility [facility] Type Diagnoses or Chief Complaint   Oct 02, 2018 [facility] [place] [facility]      Fever      Sep 23, 2018 [facility] [place] [facility] [place] [facility]      Leg Pain      Cold Exposure      Pain in right leg      Bipolar disorder, unspecified      Reaction to severe stress, unspecified      Sep 18, 2018 [place] [facility] [place] [facility] [place] [facility]      Depression      Major depressive disorder, single episode, unspecified      Aug 22, 2018 [place] [facility] [place] [facility] [place] [facility]       SOB      Shortness of Breath      Myalgia, unspecified site      Jul 31, 2018 [facility] [place] [facility] [place] [facility]      Depression      Psychiatric Evaluation      Major depressive disorder, single episode, unspecified      Homelessness      Jul 30, 2018 [facility] [place] [facility] [place] [facility]      Leg Pain      Homelessness      Pain in left ankle and joints of left foot      Pain in right ankle and joints of right foot      Jul 25, 2018  [facility] [place] [facility]      Homelessness      Headache      Anxiety disorder, unspecified          Recent Inpatient Visit Summary  No recorded inpatient visits.     Care Team  There is not a care team on record at this time.   Collective Portal  This patient has registered at the [facility] Emergency Department   For more information visit: [email or web address]     PLEASE NOTE:     1.   Any care recommendations and other clinical information are provided as guidelines or for historical purposes only, and providers should exercise their own clinical judgment when providing care.    2.   You may only use this information for purposes of treatment, payment or health care operations activities, and subject to the limitations of applicable Collective Policies.    3.   You should consult directly with the organization that provided a care guideline or other clinical history with any questions about additional information or accuracy or completeness of information provided.    ? 2020 [place], [employer] - [email or web address]

## 2018-10-02 NOTE — Discharge Instructions (Signed)
Myalgia     You have been diagnosed with muscle aches (myalgia).     Inflammation (irritation) of the muscles causes myalgias.  This causes pain. Usually, this happens when the affected muscle is over-used or injured. Sometimes, the cause is a viral illness, or an autoimmune disease. There are many other possible causes.     One possible cause is due to a rare reaction to drugs called “statins.” These drugs lower cholesterol. In rare cases, they cause muscle pain or even muscle breakdown. Symptoms include muscle aches, soreness, tenderness, or weakness.     Statins include some of the following medications:      · Atorvastatin (Lipitor®); Luvastatin (Lescol®); lovastatin (Mevacor®, Altoprev®); pitavastatin (Livalo®); pravastatin (Pravachol®); rosuvastatin (Crestor®); simvastatin (Zocor®); others may become available.  · Combination medications like simvastatin/ezetimibe (Vytorin®).     Your doctor has decided, based on your exam today, that the cause of the muscle pains is not life-threatening or dangerous. Depending on the cause for your pain, you can expect your symptoms to get better over the next week. Sometimes the symptoms can last up to a few weeks.     We don’t believe your condition is dangerous right now. However, you need to be careful. Sometimes a problem that seems small can get serious later. Therefore, it is very important for you to come back here or go to the nearest Emergency Department if you don't get better or your symptoms get worse.      Your doctor may prescribe you pain medications to treat your pain. You can also use over-the-counter medicines like acetaminophen (Tylenol®) or anti-inflammatory medicine like ibuprofen (Advil®, Motrin®) or Naproxen (Aleve®, Naprosyn®).  It is important to follow the directions for taking these medications.     Some things you can do to help your injury are: Resting, Icing, Compressing and Elevating the injured area. Remember this as "RICE."     · REST: Limit  the use of the painful body part.      · ICE: By applying ice to the affected area, swelling and pain can be reduced. Place some ice cubes in a re-sealable (Ziploc®) bag and add some water. Put a thin washcloth between the bag and the skin. Apply the ice bag to the area for at least 20 minutes. Do this at least 4 times per day. It is okay to do this more often than directed. You can also do it for longer than directed. NEVER APPLY ICE DIRECTLY TO THE SKIN.      · COMPRESS: Compression means to apply pressure around the painful area such as with a splint, cast or an ace bandage. Compression decreases swelling and improves comfort. Compression should be tight enough to relieve swelling but not so tight as to decrease circulation. Increasing pain, numbness, tingling, or change in skin color, are all signs of decreased circulation.      · ELEVATE: Elevate the painful part.      When your pain starts to get better, you'll need to do gentle stretches with the injured muscle and work on increasing your range of motion.  This will help your muscles from getting stiff and make the symptoms not last as long.     YOU SHOULD SEEK MEDICAL ATTENTION IMMEDIATELY, EITHER HERE OR AT THE NEAREST EMERGENCY DEPARTMENT, IF ANY OF THE FOLLOWING OCCUR:     · Your symptoms haven't started to get better in 5-10 days.  · You start to have severe pain in the affected body part or the body part becomes pale, numb, and very firm to the touch.  · Your urine (pee) is the wrong   color. This can be a sign of muscle breakdown.     If you can't follow up with your doctor, or if at any time you feel you need to be rechecked or seen again, come back here or go to the nearest emergency department.

## 2018-10-03 ENCOUNTER — Encounter (INDEPENDENT_AMBULATORY_CARE_PROVIDER_SITE_OTHER): Payer: Self-pay

## 2018-10-03 NOTE — ED Notes (Signed)
Patient declined flu and covid swabs.

## 2018-10-03 NOTE — ED Notes (Signed)
Pt refused to sign discharge paperwork or have vitals taken.

## 2018-10-03 NOTE — Progress Notes (Signed)
[facility]    Referral received to schedule an appointment with the [facility], however patient is unable to be contacted for scheduling due to incorrect phone number/ missing phone number/ phone number is not in service.      Sakina Briones Rojas   Patient Access II  [facility]  P: [phone]  F: [phone]

## 2018-11-02 DIAGNOSIS — F419 Anxiety disorder, unspecified: Secondary | ICD-10-CM | POA: Insufficient documentation

## 2018-11-02 DIAGNOSIS — F17210 Nicotine dependence, cigarettes, uncomplicated: Secondary | ICD-10-CM | POA: Insufficient documentation

## 2018-11-02 NOTE — EDIE (Signed)
COLLECTIVE?NOTIFICATION?11/02/2018 22:41?[name], [name]?MRN: [id number]    Criteria Met      5 ED Visits in 12 Months    Security and Safety  No recent Security Events currently on file    ED Care Guidelines  There are currently no ED Care Guidelines for this patient. Please check your facility's medical records system.        Prescription Monitoring Program  000??- Narcotic Use Score  000??- Sedative Use Score  000??- Stimulant Use Score  000??- Overdose Risk Score  - All Scores range from 000-999 with 75% of the population scoring < 200 and on 1% scoring above 650  - The last digit of the narcotic, sedative, and stimulant score indicates the number of active prescriptions of that type  - Higher Use scores correlate with increased prescribers, pharmacies, mg equiv, and overlapping prescriptions  - Higher Overdose Risk Scores correlate with increased risk of unintentional overdose death   Concerning or unexpectedly high scores should prompt a review of the PMP record; this does not constitute checking PMP for prescribing purposes.      E.D. Visit Count (12 mo.)  Facility Visits   [place] [facility] 5   [place] [facility] 3   Total 8   Note: Visits indicate total known visits.      Recent Emergency Department Visit Summary  Date Facility [facility] Type Diagnoses or Chief Complaint   Nov 02, 2018 [place] [facility] [place] [facility]      Triage A      Oct 02, 2018 [facility] [place] [facility] [place] [facility]      Fever      Generalized Body Aches      Myalgia, unspecified site      Sep 23, 2018 [facility] [place] [facility] [place] [facility]      Leg Pain      Cold Exposure      Pain in right leg      Bipolar disorder, unspecified      Reaction to severe stress, unspecified      Sep 18, 2018 [place] [facility] [place] [facility] [place] [facility]      Depression      Major depressive disorder, single episode, unspecified      Aug 22, 2018 [place] [facility] [place] [facility] [place] [facility]      SOB      Shortness of Breath      Myalgia, unspecified  site      Jul 31, 2018 [facility] [place] [facility] [place] [facility]      Depression      Psychiatric Evaluation      Major depressive disorder, single episode, unspecified      Homelessness      Jul 30, 2018 [facility] [place] [facility] [place] [facility]      Leg Pain      Homelessness      Pain in left ankle and joints of left foot      Pain in right ankle and joints of right foot      Jul 25, 2018 [place] [facility] [place] [facility] [place] [facility]      Homelessness      Headache      Anxiety disorder, unspecified          Recent Inpatient Visit Summary  No recorded inpatient visits.     Care Team  There is not a care team on record at this time.   Collective  Portal  This patient has registered at the [facility] Emergency Department   For more information visit: [email or web address]     PLEASE NOTE:     1.   Any care recommendations and other clinical information are provided as guidelines or for historical purposes only, and providers should exercise their own clinical judgment when providing care.    2.   You may only use this information for purposes of treatment, payment or health care operations activities, and subject to the limitations of applicable Collective Policies.    3.   You should consult directly with the organization that provided a care guideline or other clinical history with any questions about additional information or accuracy or completeness of information provided.    ? 2020 [place], [employer] - [email or web address]

## 2018-11-02 NOTE — ED Triage Notes (Signed)
Pt reports feeling anxious, wants medication to help with anxiety. Pt reports hx of schizophrenia- denies SI/HI, hearing voices at time of triage.

## 2018-11-03 ENCOUNTER — Emergency Department
Admission: EM | Admit: 2018-11-03 | Discharge: 2018-11-03 | Disposition: A | Discharge status: Home / Self Care | Payer: Self-pay | Attending: Emergency Medicine | Admitting: Emergency Medicine

## 2018-11-03 DIAGNOSIS — F419 Anxiety disorder, unspecified: Secondary | ICD-10-CM

## 2018-11-03 NOTE — ED Notes (Signed)
Pt has difficulty staying awake during triage interview with this RN

## 2018-11-03 NOTE — Discharge Instructions (Signed)
Dear [name]:    Thank you for choosing the [facility] Emergency Department, the premier emergency department in the [place] area.  I hope your visit today was EXCELLENT. You will receive a survey via text message that will give you the opportunity to provide feedback to your team about your visit. Please do not hesitate to reach out with any questions!    Specific instructions for your visit today:      Anxiety, Panic    You have been diagnosed with an anxiety attack.    You seem to have had an anxiety attack. There are many conditions that can cause symptoms like these. If this is the first time this has happened, follow-up with your regular doctor. You may need more testing to be sure there isn't another cause for your symptoms.    Anxiety causes very strong feelings of worry and fear. It may also cause chest pain or shortness of breath. You may feel like you have palpitations (a racing heart). You might feel numbness (like parts of your body are "asleep"), especially around the mouth and in the hands or feet.    Follow up with your counselor and family doctor. If you do not have an appointment in the next 2-3 days, call and make one. It is VERY IMPORTANT for your counselor and family doctor to know if you get worse.    YOU SHOULD SEEK MEDICAL ATTENTION IMMEDIATELY, EITHER HERE OR AT THE NEAREST EMERGENCY DEPARTMENT, IF ANY OF THE FOLLOWING OCCURS:   You have symptoms that you normally don't have with your anxiety attacks.   You think of harming yourself (suicidal thoughts) or harming someone else.   You have symptoms you normally don't have and they last longer than normal or your medicine doesn't help. These include chest pain, passing out, feeling that your heart is racing or shortness of breath.   You have a fever (temperature higher than 100.73F / 38C).                 IF YOU DO NOT CONTINUE TO IMPROVE OR YOUR CONDITION WORSENS, PLEASE CONTACT YOUR DOCTOR OR RETURN IMMEDIATELY TO THE  EMERGENCY DEPARTMENT.    Sincerely,  [name], Dietrich Pates, MD  Attending Emergency Physician  [facility] Emergency Department    ONSITE PHARMACY  Our full service onsite pharmacy is located in the ER waiting room.  Open 7 days a week from 9 am to 9 pm.  We accept all major insurances and prices are competitive with major retailers.  Ask your provider to print your prescriptions down to the pharmacy to speed you on your way home.    OBTAINING A PRIMARY CARE APPOINTMENT    Primary care physicians (PCPs, also known as primary care doctors) are either internists or family medicine doctors. Both types of PCPs focus on health promotion, disease prevention, Bruce education and counseling, and treatment of acute and chronic medical conditions.    Call for an appointment with a primary care doctor.  Ask to see who is taking new patients.     [facility]  telephone:  [phone]  [email or web address]    DOCTOR REFERRALS  Call [phone] (available 24 hours a day, 7 days a week) if you need any further referrals and we can help you find a primary care doctor or specialist.  Also, available online at:  [email or web address]    YOUR CONTACT INFORMATION  Before leaving please check with registration to make sure we  have an up-to-date contact number.  You can call registration at [phone] to update your information.  For questions about your hospital bill, please call [phone].  For questions about your Emergency Dept Physician bill please call [phone].      [place]  If you need help with health or social services, please call 2-1-1 for a free referral to resources in your area.  2-1-1 is a free service connecting people with information on health insurance, free clinics, pregnancy, mental health, dental care, food assistance, housing, and substance abuse counseling.  Also, available online at:  http://www.[place].org    MEDICAL RECORDS AND TESTS  Certain  laboratory test results do not come back the same day, for example urine cultures.   We will contact you if other important findings are noted.  Radiology films are often reviewed again to ensure accuracy.  If there is any discrepancy, we will notify you.      Please call [phone] to pick up a complimentary CD of any radiology studies performed.  If you or your doctor would like to request a copy of your medical records, please call [phone].      ORTHOPEDIC INJURY   Please know that significant injuries can exist even when an initial x-ray is read as normal or negative.  This can occur because some fractures (broken bones) are not initially visible on x-rays.  For this reason, close outpatient follow-up with your primary care doctor or bone specialist (orthopedist) is required.    MEDICATIONS AND FOLLOWUP  Please be aware that some prescription medications can cause drowsiness.  Use caution when driving or operating machinery.    The examination and treatment you have received in our Emergency Department is provided on an emergency basis, and is not intended to be a substitute for your primary care physician.  It is important that your doctor checks you again and that you report any new or remaining problems at that time.      [place]  The nearest 24 hour pharmacy is:    [facility] [place] [place] [address]  [place] [facility]  [facility])  Call to start or finish an application, compare plans, enroll or ask a question.  [place]: [phone]  Web:  Healthcare.gov    Help Enrolling in [place]  253-830-9175 (TOLL-FREE)  3205364215 (TTY)  Web:  Http://www.coverva.org    Local Help Enrolling in the [place]  201-338-4416 (MAIN)  Email:  health-help_0 .org  Web:  http://lewis-perez.info/  Address:  [address], [address] [place], [place]  [address]    SEDATING MEDICATIONS  Sedating medications include strong pain medications (e.g. narcotics), muscle relaxers, benzodiazepines (used for anxiety and as muscle relaxers), Benadryl/diphenhydramine and other antihistamines for allergic reactions/itching, and other medications.  If you are unsure if you have received a sedating medication, please ask your physician or nurse.  If you received a sedating medication: DO NOT drive a car. DO NOT operate machinery. DO NOT perform jobs where you need to be alert.  DO NOT drink alcoholic beverages while taking this medicine.     If you get dizzy, sit or lie down at the first signs. Be careful going up and down stairs.  Be extra careful to prevent falls.     Never give this medicine to others.  Keep this medicine out of reach of children.     Do not take or save old medicines. Throw them away when outdated.     Keep all medicines in a cool, dry place. DO NOT keep them in your bathroom medicine cabinet or in a cabinet above the stove.    MEDICATION REFILLS  Please be aware that we cannot refill any prescriptions through the ER. If you need further treatment from what is provided at your ER visit, please follow up with your primary care doctor or your pain management specialist.    [place]  Did you know [facility] has two freestanding ERs located just a few miles away?  [facility] [place] have short wait times, easy free parking directly in front of the building and top Bruce satisfaction scores - and the same Board Certified Emergency Medicine doctors as [facility].

## 2018-11-06 NOTE — ED Provider Notes (Signed)
With a history of homelessness                             [name] EMERGENCY DEPARTMENT H&P      Visit date: 11/03/2018      CLINICAL SUMMARY           Diagnosis:    .     Final diagnoses:   Anxiety         MDM Notes:      Patient presenting with anxiety, denies SI or HI.  Advised to follow-up as outpatient with psychiatry or PCP.  Slept for several hours in the ED.  Given strict return precautions.         Disposition:         Discharge               Discharge Prescriptions     None                         CLINICAL INFORMATION        HPI:      Chief Complaint: Anxiety  .    [name] is a 43 y.o. male with a history of homelessness who presents with request to have  management of his anxiety.  Denies SI or HI.  Has no acute medical complaints.    History obtained from: patient, review of prior chart          ROS:      Positive and negative ROS elements as per HPI.  All other systems reviewed and negative.      Physical Exam:      Pulse 88  BP 113/72  Resp 16  SpO2 98 %  Temp 97.8 F (36.6 C)    Constitutional: Well appearing. No acute distress.  Head: Normocephalic, atraumatic.  HEENT: PERRL. Neck supple. OP clear and w/o exudate.   CV: HR regular. Extremities symmetric, no edema.   Respiratory: CTAB. No respiratory distress. Speaking full sentences.  Abdomen: Soft. Non-tender, non-distended. Bowel sounds present.  Skin: Dry. Normal temp.   Neuro: Sensation grossly intact. Face symmetric. Clear speech. Strength 5/5 in all four extremities.   Psych: Normal affect and thought. Oriented x3               PAST HISTORY        Primary Care Provider: Pcp, None, MD        PMH/PSH:    .     Past Medical History:   Diagnosis Date   . Manic bipolar I disorder        He has no past surgical history on file.      Social/Family History:      He reports that he has been smoking. He has been smoking about 0.50 packs per day. He has never used smokeless tobacco. He reports current alcohol use. He reports that he does  not use drugs.    History reviewed. No pertinent family history.      Listed Medications on Arrival:    .     Home Medications     Med List Status: In Progress Set By: Laural Golden, RN at 11/03/2018  1:06 AM        No Medications         Allergies: He has No Known Allergies.            VISIT INFORMATION  Clinical Course in the ED:                 Medications Given in the ED:    .     ED Medication Orders (From admission, onward)    None            Procedures:      Procedures      Interpretations:      O2 sat-           saturation: 98 %; Oxygen use: room air; Interpretation: Normal                   RESULTS        Lab Results:      Results     ** No results found for the last 24 hours. **              Radiology Results:      No orders to display               Scribe Attestation:      No scribe involved in the care of this patient          Phillis Knack, MD  11/06/18 [phone]

## 2019-01-16 ENCOUNTER — Emergency Department
Admission: EM | Admit: 2019-01-16 | Discharge: 2019-01-16 | Disposition: A | Discharge status: Home / Self Care | Payer: Medicaid HMO | Attending: Emergency Medicine | Admitting: Emergency Medicine

## 2019-01-16 DIAGNOSIS — F319 Bipolar disorder, unspecified: Secondary | ICD-10-CM | POA: Insufficient documentation

## 2019-01-16 DIAGNOSIS — R4189 Other symptoms and signs involving cognitive functions and awareness: Secondary | ICD-10-CM

## 2019-01-16 DIAGNOSIS — F209 Schizophrenia, unspecified: Secondary | ICD-10-CM | POA: Insufficient documentation

## 2019-01-16 NOTE — ED Notes (Signed)
Pt upset that nurse would not give him medicine here in ED. Nurse asked again if pt knows name of medication he was prescribed. Pt said, "Man, no that's why I came here so you can give it to me." Nurse again explained that pt would need to fill rx that had already been given to him so that he would have meds. Pt has no further questions.

## 2019-01-16 NOTE — ED Provider Notes (Signed)
[facility] EMERGENCY DEPARTMENT H&P      Visit date: 01/16/2019      CLINICAL SUMMARY           Diagnosis:    .     Final diagnoses:   Cognitive and behavioral changes         MDM Notes:      Pt refuses testing in ED. Denies SI or HI.  Alert and oriented x 3.   Pt does not wish to have mental health evaluation.   Referred to [facility] for evaluation of potential medications.          Disposition:         Discharge           Discharge Prescriptions     None                         CLINICAL INFORMATION        HPI:      Chief Complaint: Psychiatric Evaluation  .    [name] is a 44 y.o. male who presents requesting to be given prescribed medications. Pt unsure of name of medications. Denies SI. Denies HI. Denies ingestion of drugs or alcohol. Pt does not wish to have mental health evaluation in ED. Pt declines lab testing.      History obtained from: Patient          ROS:      Positive and negative ROS elements as per HPI.  All other systems reviewed and negative.      Physical Exam:      Pulse 86   BP 118/72   Resp 18   SpO2 98 %   Temp 98.6 F (37 C)    Physical Exam   Constitutional: He is oriented to person, place, and time. He appears well-developed and well-nourished.   HENT:   Head: Normocephalic and atraumatic.   Eyes: EOM are normal. Right eye exhibits no discharge. Left eye exhibits no discharge.   Neck: Normal range of motion. Neck supple.   Cardiovascular: Normal rate and regular rhythm.   Pulmonary/Chest: Effort normal and breath sounds normal.   Abdominal: Soft. He exhibits no distension. There is no abdominal tenderness.   Musculoskeletal: Normal range of motion.         General: No tenderness, deformity or edema.   Neurological: He is alert and oriented to person, place, and time.   Skin: Skin is warm and dry.   Psychiatric:   Denies SI or HI.    Nursing note and vitals reviewed.               PAST HISTORY        Primary Care Provider: Valetta Fuller, MD         PMH/PSH:    .     Past Medical History:   Diagnosis Date    Manic bipolar I disorder     Schizophrenia        He has no past surgical history on file.      Social/Family History:      He reports that he has been smoking. He has been smoking about 2.00 packs per day. He has never used smokeless tobacco. He reports current alcohol use. He reports that he does not use drugs.    History reviewed. No pertinent family history.      Listed Medications on Arrival:    .     Home  Medications     Med List Status: Complete Set By: Elizabeth Sauer, RN at 01/16/2019  4:44 AM        No Medications         Allergies: He has No Known Allergies.            VISIT INFORMATION        Clinical Course in the ED:                 Medications Given in the ED:    .     ED Medication Orders (From admission, onward)    None            Procedures:      Procedures      Interpretations:                     RESULTS        Lab Results:      Results     ** No results found for the last 24 hours. **              Radiology Results:      No orders to display               Scribe Attestation:      No scribe involved in the care of this patient              Ardelle Anton, MD  01/16/19 [phone]

## 2019-01-16 NOTE — ED Notes (Signed)
Bed: N 43  Expected date:   Expected time:   Means of arrival:   Comments:  Medic 429

## 2019-01-16 NOTE — Discharge Instructions (Signed)
[facility]  [facility] supports [facility]'s overall mission to promote total wellness- mind and body- by offering a full spectrum of mental health and addiction treatment services to the [place] metropolitan area.      DESCRIPTION OF SERVICES  [facility]. [facility] provides a unique and valuable resource to our community by offering an urgent psychiatric assessment. Typically the wait to see an outpatient psychiatrist in the [place] area is at least a 1-2 months, and a majority of those do not accept insurance. At [facility], a patient can schedule or walk in to have an urgent appointment (preferred to decrease wait time) with a psychiatrist from Noon- 8PM every day. [facility] clinicians can be accessed by phone 24/7 ([phone]). Most insurances are accepted and self pay options are available as well. [facility] can refer and place clients into one of our mental health or addiction programs depending on their treatment needs.    [facility]  [address]  [address]  [place], [place] [address]  [phone]    Inpatient Services Our Inpatient Psychiatric Units are located within the [place] [facility] [place] [facility]. Our renowned [facility] inpatient medical detoxification unit is located within the [facility]. Our inpatient units provide our patients with safety, structure, and stabilization via medication management and therapeutic group activities held throughout the day. Our multi-disciplinary team includes physicians, nurses, therapists, and case managers.    Day Treatment/Partial Hospitalization Program (PHP). Brief, supportive and structured crisis intervention with supportive, short term and solution focused group therapy. Each PHP consists of daily therapeutic and educational groups with either a mental health or addiction recovery emphasis. Medication evaluation and management are  provided by a physician as needed. Locations at our [place], [facility], and [place] outpatient centers. The overall length of stay varies on a case-by-case basis but is usually one to two weeks.    Intensive Outpatient Program (IOP). Intensive recovery group services structured to address the needs of those in early recovery from chemical dependence. Each IOP meets three times a week, three hours at a time, for a total of ten weeks. Day and Evening time are offered. Locations are at [place], [facility] and [place] outpatient centers. A Dual Diagnosis IOP is also available for those experiencing a co-occurring chemical dependence and psychiatric diagnosis.    Outpatient Medication Management Services Psychiatric evaluation and medication management performed by a physician or nurse practitioner on an outpatient basis. This includes our [facility] that provides long acting Zyprexa, Haldol, and Risperdal administered intramuscularly by a registered nurse. To assist in recovery, we also offer Medication Assisted Therapy which consists of Suboxone and Vivitrol induction and management prescribed and supervised by a physician and administered by a registered nurse. Hepatitis B vaccine also available. Services available at our [place], [place], and [place] locations.    Counseling Services offering individual and family psychotherapy are available. Bariatric counseling is given in conjunction with [facility] renowned Bariat[facility] Surgical team. For addiction recovery, we offer family awareness groups, sober living and relapse prevention groups. [facility] offers adolescents Dialectical Behavioral Therapy (DBT) groups, skill groups and psychotherapy groups.      SERVICE LOCATIONS     [facility]  [address]  [address]  [place], [place] [address]  . Psychiatric or Addiction Crisis Evaluation  . By appointment (preferred) or by appointment  . Central  Access Call Center 24/7   [phone]    [facility]  [address]  [place], [place] [address]  . Inpatient Psychiatry  . [facility] inpatient medical detoxification  . [facility] day treatment (PHP)    [facility]  [address]  [place], [place] [address]  . Mental Health Day Treatment (PHP)  . [facility] Intensive Outpatient Program (IOP)  . Medication Management Services  . Counseling Services- Individual     [facility]  [address]  [place], [place] [address]  [phone]  . Mental Health Day Treatment (PHP)  . Mental Health Intensive Outpatient Program (IOP)  . Medication Management Services  . Counseling Services- Autism Spectrum, ADD and educational testing, Individual and Family Psychotherapy,     DBT groups  . Therapeutic Day School    [facility]  [address]  [place], [place] [address]  . Inpatient Psychiatry    [facility]  [address]  [address]  [place], [place] [address]  . Mental Health Day Treatment (PHP)  . [facility] Intensive Outpatient Program (IOP)  . Medication Management Services  . Counseling Services- Individual Psychotherapy    [facility]  [address]  [place], [place] [address]  . Inpatient Psychiatry    [facility] [place]  [address]  [place], [place] [address]  . Mental Health Day Treatment (PHP)  . [facility] Intensive Outpatient Program (IOP)  . Medication Management Services    [facility]  [address]  [place], [place] [address]  . Bariatric Counseling      ADMISSIONS    Adults: [phone]  Adolescents: [phone]

## 2019-01-16 NOTE — EDIE (Signed)
COLLECTIVE?NOTIFICATION?01/16/2019 04:28?JOHNSTON, HOMAN B?MRN: [id number]    Criteria Met      5 ED Visits in 12 Months    Security and Safety  No recent Security Events currently on file    ED Care Guidelines  There are currently no ED Care Guidelines for this patient. Please check your facility's medical records system.        Prescription Monitoring Program  000??- Narcotic Use Score  000??- Sedative Use Score  000??- Stimulant Use Score  000??- Overdose Risk Score  - All Scores range from 000-999 with 75% of the population scoring < 200 and on 1% scoring above 650  - The last digit of the narcotic, sedative, and stimulant score indicates the number of active prescriptions of that type  - Higher Use scores correlate with increased prescribers, pharmacies, mg equiv, and overlapping prescriptions  - Higher Overdose Risk Scores correlate with increased risk of unintentional overdose death   Concerning or unexpectedly high scores should prompt a review of the PMP record; this does not constitute checking PMP for prescribing purposes.      E.D. Visit Count (12 mo.)  Facility Visits   [facility] 4   [facility] 1   [facility] 2   [facility] 5   [facility] 3   [facility] 1   [facility] 4   Total 20   Note: Visits indicate total known visits.     Recent Emergency Department Visit Summary  Showing 10 most recent visits out of 20 in the past 12 months  Date Facility [facility] Type Diagnoses or Chief Complaint   Jan 16, 2019 [facility] [place] [facility]      psych problem      Nov 06, 2018 [place] [facility] [place] [facility] [place] [facility]      DEPRESSION      Psychiatric Evaluation      Schizophrenia, unspecified      Homelessness      Nov 05, 2018 [place] [facility] [place] [facility] [place] [facility]      anxiety      Schizophrenia, unspecified      Nov 03, 2018 [place] [facility] [facility] [place] [facility]      Triage  A      Anxiety      Anxiety disorder, unspecified      Oct 02, 2018 [facility] [place] [facility] [place] [facility]      Fever      Generalized Body Aches      Myalgia, unspecified site      Sep 23, 2018 [facility] [place] [facility] [place] [facility]      Leg Pain      Cold Exposure      Pain in right leg      Bipolar disorder, unspecified      Reaction to severe stress, unspecified      Sep 18, 2018 [place] [facility] [place] [facility] [place] [facility]      Depression      Major depressive disorder, single episode, unspecified      Aug 22, 2018 [place] [facility] [facility] [place] [facility]      SOB      Shortness of Breath      Myalgia, unspecified site      Jul 31, 2018 [facility] [place] [facility] [place] [facility]      Depression  Psychiatric Evaluation      Major depressive disorder, single episode, unspecified      Homelessness      Jul 30, 2018 [facility] [place] [facility] [place] [facility]      Leg Pain      Homelessness      Pain in left ankle and joints of left foot      Pain in right ankle and joints of right foot          Recent Inpatient Visit Summary  No recorded inpatient visits.     Care Team  Provider Specialty Phone Fax Service Dates   ASSIGNED, NO PCP, CNM Advanced Practice Midwife [phone] Current      Collective Portal  This patient has registered at the [facility] Emergency Department   For more information visit: [email or web address]     PLEASE NOTE:     1.   Any care recommendations and other clinical information are provided as guidelines or for historical purposes only, and providers should exercise their own clinical judgment when providing care.    2.   You may only use this information for purposes of treatment, payment or health care operations activities, and subject to the limitations of applicable Collective Policies.    3.   You should consult directly with the organization that provided a care guideline or other clinical history with any  questions about additional information or accuracy or completeness of information provided.    ? 2021 [place] - [email or web address]

## 2019-01-16 NOTE — ED Notes (Signed)
Nurse asked why pt came to ED, pt said, "Nothing is wrong." Nurse asked pt was in ED then. Pt said, "I feel depressed sometimes man, you know?" States he was given rx for psych meds, cannot remember names, but has not filled them because "[facility]." Pt cooperative, but avoiding eye contact.

## 2019-01-23 ENCOUNTER — Emergency Department
Admission: EM | Admit: 2019-01-23 | Discharge: 2019-01-23 | Disposition: A | Discharge status: Home / Self Care | Payer: Medicaid HMO | Attending: Emergency Medicine | Admitting: Emergency Medicine

## 2019-01-23 ENCOUNTER — Inpatient Hospital Stay (HOSPITAL_BASED_OUTPATIENT_CLINIC_OR_DEPARTMENT_OTHER): Payer: Self-pay

## 2019-01-23 DIAGNOSIS — F319 Bipolar disorder, unspecified: Secondary | ICD-10-CM

## 2019-01-23 DIAGNOSIS — F1290 Cannabis use, unspecified, uncomplicated: Secondary | ICD-10-CM

## 2019-01-23 DIAGNOSIS — F1020 Alcohol dependence, uncomplicated: Secondary | ICD-10-CM

## 2019-01-23 DIAGNOSIS — Z008 Encounter for other general examination: Secondary | ICD-10-CM

## 2019-01-23 DIAGNOSIS — F29 Unspecified psychosis not due to a substance or known physiological condition: Secondary | ICD-10-CM | POA: Insufficient documentation

## 2019-01-23 LAB — ECG 12-LEAD
Atrial Rate: 78 {beats}/min
P Axis: 110 degrees
P-R Interval: 188 ms
Q-T Interval: 386 ms
QRS Duration: 102 ms
QTC Calculation (Bezet): 440 ms
R Axis: 172 degrees
T Axis: 170 degrees
Ventricular Rate: 78 {beats}/min

## 2019-01-23 LAB — URINALYSIS REFLEX TO MICROSCOPIC EXAM - REFLEX TO CULTURE
Bilirubin, UA: NEGATIVE
Blood, UA: NEGATIVE
Glucose, UA: NEGATIVE
Ketones UA: NEGATIVE
Leukocyte Esterase, UA: NEGATIVE
Nitrite, UA: NEGATIVE
Protein, UR: NEGATIVE
Specific Gravity UA: 1.017 (ref 1.001–1.035)
Urine pH: 6 (ref 5.0–8.0)
Urobilinogen, UA: 2 mg/dL (ref 0.2–2.0)

## 2019-01-23 LAB — CBC AND DIFFERENTIAL
Absolute NRBC: 0 10*3/uL (ref 0.00–0.00)
Basophils Absolute Automated: 0.04 10*3/uL (ref 0.00–0.08)
Basophils Automated: 0.6 %
Eosinophils Absolute Automated: 0.16 10*3/uL (ref 0.00–0.44)
Eosinophils Automated: 2.4 %
Hematocrit: 44.6 % (ref 37.6–49.6)
Hgb: 14.9 g/dL (ref 12.5–17.1)
Immature Granulocytes Absolute: 0.02 10*3/uL (ref 0.00–0.07)
Immature Granulocytes: 0.3 %
Lymphocytes Absolute Automated: 2.7 10*3/uL (ref 0.42–3.22)
Lymphocytes Automated: 39.8 %
MCH: 30 pg (ref 25.1–33.5)
MCHC: 33.4 g/dL (ref 31.5–35.8)
MCV: 89.9 fL (ref 78.0–96.0)
MPV: 10.7 fL (ref 8.9–12.5)
Monocytes Absolute Automated: 0.53 10*3/uL (ref 0.21–0.85)
Monocytes: 7.8 %
Neutrophils Absolute: 3.34 10*3/uL (ref 1.10–6.33)
Neutrophils: 49.1 %
Nucleated RBC: 0 /100 WBC (ref 0.0–0.0)
Platelets: 184 10*3/uL (ref 142–346)
RBC: 4.96 10*6/uL (ref 4.20–5.90)
RDW: 13 % (ref 11–15)
WBC: 6.79 10*3/uL (ref 3.10–9.50)

## 2019-01-23 LAB — RAPID DRUG SCREEN, URINE
Barbiturate Screen, UR: NEGATIVE
Benzodiazepine Screen, UR: NEGATIVE
Cannabinoid Screen, UR: NEGATIVE
Cocaine, UR: NEGATIVE
Opiate Screen, UR: NEGATIVE
PCP Screen, UR: NEGATIVE
Urine Amphetamine Screen: NEGATIVE

## 2019-01-23 LAB — COMPREHENSIVE METABOLIC PANEL
ALT: 18 U/L (ref 0–55)
AST (SGOT): 20 U/L (ref 5–34)
Albumin/Globulin Ratio: 1.3 (ref 0.9–2.2)
Albumin: 3.8 g/dL (ref 3.5–5.0)
Alkaline Phosphatase: 65 U/L (ref 38–106)
Anion Gap: 11 (ref 5.0–15.0)
BUN: 12 mg/dL (ref 9.0–28.0)
Bilirubin, Total: 0.6 mg/dL (ref 0.2–1.2)
CO2: 26 mEq/L (ref 22–29)
Calcium: 9.5 mg/dL (ref 8.5–10.5)
Chloride: 104 mEq/L (ref 100–111)
Creatinine: 0.8 mg/dL (ref 0.7–1.3)
Globulin: 2.9 g/dL (ref 2.0–3.6)
Glucose: 79 mg/dL (ref 70–100)
Potassium: 4 mEq/L (ref 3.5–5.1)
Protein, Total: 6.7 g/dL (ref 6.0–8.3)
Sodium: 141 mEq/L (ref 136–145)

## 2019-01-23 LAB — HEMOLYSIS INDEX: Hemolysis Index: 11 (ref 0–18)

## 2019-01-23 LAB — ETHANOL: Alcohol: NOT DETECTED mg/dL

## 2019-01-23 LAB — GFR: EGFR: >60

## 2019-01-23 NOTE — Discharge Instructions (Signed)
[facility]  [facility] supports [facility]'s overall mission to promote total wellness- mind and body- by offering a full spectrum of mental health and addiction treatment services to the [place] metropolitan area.      DESCRIPTION OF SERVICES  [facility]. [facility] provides a unique and valuable resource to our community by offering an urgent psychiatric assessment. Typically the wait to see an outpatient psychiatrist in the [place] area is at least a 1-2 months, and a majority of those do not accept insurance. At [facility], a patient can schedule or walk in to have an urgent appointment (preferred to decrease wait time) with a psychiatrist from Noon- 8PM every day. [facility] clinicians can be accessed by phone 24/7 ([phone]). Most insurances are accepted and self pay options are available as well. [facility] can refer and place clients into one of our mental health or addiction programs depending on their treatment needs.    [facility]  [address]  [address]  [place], [place] [address]  [phone]    Inpatient Services Our Inpatient Psychiatric Units are located within the [facility] [place] [facility]. Our renowned [facility] inpatient medical detoxification unit is located within the [facility]. Our inpatient units provide our patients with safety, structure, and stabilization via medication management and therapeutic group activities held throughout the day. Our multi-disciplinary team includes physicians, nurses, therapists, and case managers.    Day Treatment/Partial Hospitalization Program (PHP). Brief, supportive and structured crisis intervention with supportive, short term and solution focused group therapy. Each PHP consists of daily therapeutic and educational groups with either a mental health or addiction recovery emphasis. Medication evaluation and management are  provided by a physician as needed. Locations at our [place] [place], and [place] outpatient centers. The overall length of stay varies on a case-by-case basis but is usually one to two weeks.    Intensive Outpatient Program (IOP). Intensive recovery group services structured to address the needs of those in early recovery from chemical dependence. Each IOP meets three times a week, three hours at a time, for a total of ten weeks. Day and Evening time are offered. Locations are at [place], [place] and [place] outpatient centers. A Dual Diagnosis IOP is also available for those experiencing a co-occurring chemical dependence and psychiatric diagnosis.    Outpatient Medication Management Services Psychiatric evaluation and medication management performed by a physician or nurse practitioner on an outpatient basis. This includes our [facility] that provides long acting Zyprexa, Haldol, and Risperdal administered intramuscularly by a registered nurse. To assist in recovery, we also offer Medication Assisted Therapy which consists of Suboxone and Vivitrol induction and management prescribed and supervised by a physician and administered by a registered nurse. Hepatitis B vaccine also available. Services available at our [place], [place], and [place] locations.    Counseling Services offering individual and family psychotherapy are available. Bariatric counseling is given in conjunction with [facility] renowned B[facility] team. For addiction recovery, we offer family awareness groups, sober living and relapse prevention groups. [facility] offers adolescents Dialectical Behavioral Therapy (DBT) groups, skill groups and psychotherapy groups.      SERVICE LOCATIONS     [facility]  [address]  [address]  [place],  [address]  . Psychiatric or Addiction Crisis Evaluation  . By appointment (preferred) or by appointment  . Central  Access Call Center 24/7   [phone]    [facility]  [address]  [place], [place] [address]  . Inpatient Psychiatry  . CATS inpatient medical detoxification  . [facility] day treatment (PHP)    [facility]  [address]  [place], [place] [address]  . Mental Health Day Treatment (PHP)  . [facility] Intensive Outpatient Program (IOP)  . Medication Management Services  . Counseling Services- Individual     [facility]  [address]  [place], [place] [address]  [phone]  . Mental Health Day Treatment (PHP)  . Mental Health Intensive Outpatient Program (IOP)  . Medication Management Services  . Counseling Services- Autism Spectrum, ADD and educational testing, Individual and Family Psychotherapy,     DBT groups  . Therapeutic Day School    [facility]  [address]  [place], [place] [address]  . Inpatient Psychiatry    [facility]  [address]  [address]  [place], [place] [address]  . Mental Health Day Treatment (PHP)  . [facility] Intensive Outpatient Program (IOP)  . Medication Management Services  . Counseling Services- Individual Psychotherapy    [facility]  [address]  [place], [place] [address]  . Inpatient Psychiatry    [facility] [place]  [address]  [place], [place] [address]  . Mental Health Day Treatment (PHP)  . [facility] Intensive Outpatient Program (IOP)  . Medication Management Services     [facility]  [address]  , [place] [address]  . Bariatric Counseling      ADMISSIONS    Adults: [phone]  Adolescents: [phone]

## 2019-01-23 NOTE — ED Notes (Signed)
Patient currently refusing any care from this RN. Refusing vitals signs.

## 2019-01-23 NOTE — Progress Notes (Signed)
Psychiatric Evaluation Part I    [name] is a 44 y.o. male admitted to the [facility] Emergency Department who was seen on 01/23/2019 by Boykin Reaper.    Call Details  Patient Location: [facility]  Patient Room Number: GR12  Time contacted by ED Physician: 0211  Time consult began: 1145(pt queue)  Time (in minutes) from Call to Consult: 574  Time consult concluded: 1215  Referring ED Department  Emergency Department: [facility]        Discharge Planning  Living Arrangements: Alone  Support Systems: None  Type of Residence: Homeless  Patient expects to be discharged to:: home    Presenting Mental Status  Orientation Level: Oriented X4  Memory: No Impairment  Thought Content: normal  Thought Process: disorganized  Behavior: normal  Consciousness: Alert  Impulse Control: impaired  Perception: normal  Eye Contact: none  Attitude: cooperative  Mood: normal  Hopelessness Affects Goals: No  Hopelessness About Future: No  Affect: normal  Speech: normal  Concentration: impaired  Insight: fair  Judgment: fair  Appearance: unkempt  Appetite: normal  Weight change?: normal  Energy: normal  Sleep: normal  Reliability of Reporter/Patient: fair    CSSR-S:  1) Have you wished you were dead or wished you could go to sleep and not wake up? "No"  2) Have you actually had any thoughts of killing yourself? "No"    If yes to 2, ask questions 3,4,5 and 6. If NO to 2, go directly to question 6.    3) Have you been thinking about how you might do this? N/A  4) Have you had these thoughts and had some intention of acting on them? N/A  5) Have you started to work out or worked out the details of how to kill yourself? Do you intend to carry out this plan? N/A  6) Have you ever done anything, started to do anything, or prepared to do anything to end your life? If yes, was this within the past three months? "No"       Justification for level of risk identified based on CSSR-S results: Pt denies current SI/HI and is able to contract  for safety at this time.     Within the Last 6 Months:: no history of violence toward self  Greater than 6 Months Ago:: no history of violence toward self                 Recovery and Support Involvement  Support Systems: None          Preliminary Diagnosis #1: F31.9 Bipolar disorder, unspecified  Preliminary Diagnosis #2: F10.20 Alcohol dependence, uncomplicated  Preliminary Diagnosis #3: F12.90 Cannabis use, unspecified, uncomplicated     Violence Toward Others  Within the Last 6 Months:: no history of violence toward others  Greater than 6 Months Ago:: no history of violence toward others     Preliminary Diagnosis (DSM IV)  Axis I: F31.9 Bipolar disorder, unspecified; F10.20 Alcohol dependence, uncomplicated; F12.90 Cannabis use, unspecified, uncomplicated  Axis II: Deferred  Axis III: See medical chart  Axis IV: Primary support group, Social environment, Housing, Access to health care services, Economic  Axis V on Admission: 75    Presenting Problem: Pt arrives to ED reporting "stress due to unreliable circumstances." Per ED note, pt initially reported active AH and passive SI without plan or intent. At time of this eval, pt denies both AH and SI. Pt is a poor historian. Per chart review, pt  has a history of delusions, loose associations, disorganized thought process, agitation, insomnia, racing thoughts, and mania. Pt denies any of these symptoms at this time. Pt indicates that he is in need of his psychiatric meds. Per chart review, pt has a history of non-adherence to his meds. Pt denies any safety concerns and/or acute psychiatric needs at this time.    Current Major Stressors: Pt reports his current stressors as housing and being in need of psych meds.    Access to Lethal Means: N/A - pt denies    Current SI: N/A - pt denies    Current HI: N/A - pt denies    Current SIB: N/A - pt denies    Current AVH: N/A - pt denies    Current Psychosis: N/A - pt denies    Physiological Functioning: Pt reports maintaining  an average sleeping pattern and appetite as of late. He denies any recent weight changes. Pt indicates his energy level as average.     PSYCHIATRIC HISTORY    Inpatient Treatment History: Per chart review, pt has one prior documented inpatient psychiatric hospitalization occurring at [facility] in [place], [place] from 03/10/2016 - 03/14/2016. Pt denies any other hospitalizations.     Outpatient Treatment History (Current & Past)    Outpatient Psychiatrist/Psychiatric NP: N/A - pt denies    Outpatient Psychotherapist: N/A - pt denies    Outpatient Programs: N/A - pt denies    Diagnoses (Past): Pt reports a psych history of Bipolar I Disorder and Schizophrenia    Prior Medication Trials: Per chart review - Invega 6 mg po qd, Depakote ER 500 mg po bid, Cogentin 0.5 mg po bid, Haldol 5 mg, and Risperdal 3 mg    Suicide Attempts: N/A - pt denies     Suicidal Ideations: N/A - pt denies; however, chart review indicates that pt has a history of passive SI without plans or intent    Self-Injurious Behaviors: N/A - pt denies    Homicidal Ideations: N/A - pt denies    Psychosis: N/A - pt denies; however, chart review indicates that pt has a history of delusions, disorganization, and AVH.    History of Violence/Aggression: N/A - pt denies    Trauma History: N/A - pt denies    MEDICAL HISTORY    Active/Current Conditions: N/A - pt denies    Chronic Medical Conditions: N/A - pt denies    Substance Abuse History: Pt report struggling with addiction for "all my life." Pt reports smoking marijuana "every 6 months." Last use was 3 weeks ago. Pt notes continuous use of etoh, drinking 6-12 cans of beer daily. Pt states he last drank 2 weeks ago. Pt denies any history of blackouts, withdrawal seizures, withdrawal hallucinations, DTs, or any other withdrawal symptoms. He denies prior substance abuse treatment episodes. Pt is not involved in AA and does not have a sponsor the patient denies ay charges for DUI/DIPs.    BAL upon arrival is  none-detected.  UDS is negative for all substances.    HOME MEDICATIONS      Name   Dose   Frequency   Type of Med   Level of Adherence   Notes     N/A - pt denies   -   -   -   -   -     SOCIAL HISTORY    Current Living Situation: Pt reports being currently homeless within [facility] for the past year.    Supports: Pt denies  having any support system at this time.    Employment Status/Occupation: Pt is unemployed.     Legal History: N/A - pt denies    FAMILY HISTORY    Psychiatric Diagnoses: N/A - pt denies    Substance Use: N/A - pt denies    Suicide Attempts: N/A - pt denies    Mental Status Exam: Pt is alert and oriented x4. He is calm and cooperative in ED. Pt is appropriately dressed and appears unkempt. Pt's remote memory is impaired. He demonstrates thought content within normal limits and disorganized thought process. Pt does not make any eye contact throughout eval.     Summary: Pt is a 44 y.o. male with a history of Bipolar I Disorder, Schizophrenia, and etoh dependence who presents to [facility] ED due to seeking psychiatric medications. Pt BIB EMS.     Per ED note, pt initially reporting AH and passive SI without plan or intent. Pt now denies any current SI, HI, SIB, and AVH. Pt also denies any history. Pt is a poor historian. Pt reports not having his psych meds for an unknown amount of time. Per chart review, pt has a history of non-adherence to psych meds, delusional thinking, disorganized thought process, mania, agitation, insomnia, racing thoughts, AH, and passive SI without plan or intent. Pt denies any of this information to be true.     Chart review indicates pt has one prior inpatient psychiatric hospitalization in 2018. Pt is not currently followed by a psychiatrist nor therapist. Pt is not prescribed any meds at this time. Pt denies any history of aggression/violence, trauma history, legal history, family psych history, and medical history.     Pt reports a lifelong addiction to etoh, drinking  6-12 cans of beer daily. However, pt reports last drinking 2 weeks ago. He denies ever experiencing any withdrawal symptoms.    Pt reports being currently homeless in [facility] [place] for the past year. He is unemployed and denies having any support systems. Pt indicates his current stressors as housing and access to psych meds. Pt is able to contract for safety at this time and is able to care for self. For these reasons, pt does not pose an imminent need for inpatient psychiatric hospitalization at this time.     After discussing treatment options with pt, he is agreeable to d/c from ED and follow up with [facility] walk-in services.    Diagnosis: F31.9 Bipolar disorder, unspecified; F10.20 Alcohol dependence, uncomplicated; F12.90 Cannabis use, unspecified, uncomplicated    Disposition: This Clinical research associate consulted with Dr. Lavonda Jumbo (ED provider) who has medically cleared pt and is in agreement with disposition. This Clinical research associate faxed resources for [employer] services to ED for pt to follow up with.    Justification for disposition: Pt presents to ED due to need for psych meds. Pt denies current SI, HI, SIB, and AVH. Pt is able to contract for safety at this time. Pt will follow-up with [facility] for outpatient psychiatric providers/treatment.     Admission Status/Petitioner  If patient is voluntarily admitted to an  inpatient psychiatric unit and decides to leave AMA within the first 8 hours on the unit, is there an identified petitioner? N/A    Name of Petitioner: N/A  Contact Information: N/A  If not, why?: N/A    Insurance Pre-authorization information: N/A - pt d/c home    Was someone informed to have consent for voluntary admission obtained and scanned into EPIC? N/A   Who was  informed? N/A    Boykin Reaper, MSW, Supervisee of Social Work  Emergency Psychiatric Clinician I  [facility]  [address]  [place], [place]  [address]  [phone]

## 2019-01-23 NOTE — ED Notes (Signed)
Bed: GRH3  Expected date:   Expected time:   Means of arrival:   Comments:

## 2019-01-23 NOTE — ED Notes (Shared)
SIGN-OUT RECEIVED 7:39 AM   I assumed care of this patient from Dr. Layla Barter.    Pt awaiting psychiatric consult for psychosis. Will defer disposition to psych team.     _____    Reassessment Time:       Reassessment:    12:17 PM - Per psych liaison, pt is cleared for discharge.     12:40 PM - Pt denies any SI or HI. Given [facility] referral.     Chart reconciliation: Dr. Layla Barter was the primary emergency physician of record.    1. Psychosis, unspecified psychosis type        ED Disposition     ED Disposition Condition Date/Time Comment    Discharge  Wed Jan 23, 2019 12:40 PM Laurence Slate discharge to home/self care.    Condition at disposition: Stable

## 2019-01-23 NOTE — ED Provider Notes (Signed)
Pt presents for psychiatric evaluation.  Pt states that he feels like he needs to be on medications.  Pt reports hx of schizophrenia and depression.  Pt endorses increasing symptoms recently.  + auditory hallucinations with some passive SI.  Pt denies any medication ingestion.  Reports occasional marijuana use as well as etoh use.  Hx from the pt.      Reviewed Past Medical History, Surgical History, Family History and Social as documented.      Review of Systems   Constitutional: Negative.    Gastrointestinal: Negative.    Psychiatric/Behavioral: Positive for hallucinations, substance abuse and suicidal ideas.   All other systems reviewed and are negative.    Physical Exam  Vitals signs and nursing note reviewed.   Constitutional:       Appearance: Normal appearance.   HENT:      Head: Normocephalic and atraumatic.   Eyes:      General: No scleral icterus.     Extraocular Movements: Extraocular movements intact.   Neck:      Musculoskeletal: Normal range of motion and neck supple.   Cardiovascular:      Rate and Rhythm: Normal rate.   Pulmonary:      Effort: Pulmonary effort is normal. No respiratory distress.   Musculoskeletal: Normal range of motion.   Skin:     General: Skin is warm and dry.   Neurological:      General: No focal deficit present.      Mental Status: He is alert and oriented to person, place, and time.       EKG: normal sinus rhythm, nl intervals.    2:13 AM  Spoke with psych, will evaluate the pt.      6:30 AM  Pt resting comfortably, tolerating PO.  Await psych evaluation.    7:06 AM  Signed out to Dr. Joycelyn Das, awaiting psychiatric evaluation.     Jethro Bastos, MD  01/23/19 [phone]

## 2019-01-23 NOTE — ED Triage Notes (Signed)
[name] is a 44 y.o. male presenting to the ED BIBA from a bus stop, patient told the bus driver that he need to go to the ER, EMS arrived and patient was pleasant and cooperative, muttering to himself. Patient reports to need his medications refilled for which he does not know the name of at this time.

## 2019-01-23 NOTE — ED Notes (Signed)
Bed: GR12  Expected date:   Expected time:   Means of arrival:   Comments:

## 2019-01-23 NOTE — EDIE (Signed)
COLLECTIVE?NOTIFICATION?01/23/2019 01:07?[name], [name]?MRN: [id number]    Criteria Met      5 ED Visits in 12 Months    3 Different Facilities in 90 Days    Security and Safety  No recent Security Events currently on file    ED Care Guidelines  There are currently no ED Care Guidelines for this patient. Please check your facility's medical records system.        Prescription Monitoring Program  000??- Narcotic Use Score  000??- Sedative Use Score  000??- Stimulant Use Score  000??- Overdose Risk Score  - All Scores range from 000-999 with 75% of the population scoring < 200 and on 1% scoring above 650  - The last digit of the narcotic, sedative, and stimulant score indicates the number of active prescriptions of that type  - Higher Use scores correlate with increased prescribers, pharmacies, mg equiv, and overlapping prescriptions  - Higher Overdose Risk Scores correlate with increased risk of unintentional overdose death   Concerning or unexpectedly high scores should prompt a review of the PMP record; this does not constitute checking PMP for prescribing purposes.      E.D. Visit Count (12 mo.)  Facility Visits   [facility] 4   [facility] 1   [facility] 2   [facility] 1   [facility] 5   [facility] 3   [facility] 1   [facility] 4   Total 21   Note: Visits indicate total known visits.     Recent Emergency Department Visit Summary  Showing 10 most recent visits out of 21 in the past 12 months  Date Facility [facility] Type Diagnoses or Chief Complaint   Jan 23, 2019 [place] [facility] [place] [facility]      Psych evaluation      Jan 16, 2019 [place] [facility] [place] [facility]      psych problem      Psychiatric Evaluation      Other symptoms and signs involving appearance and behavior      Other symptoms and signs involving cognitive functions and awareness      Nov 06, 2018 [place]  [facility] [place] [place] [facility]      DEPRESSION      Psychiatric Evaluation      Schizophrenia, unspecified      Homelessness      Nov 05, 2018 [place] [facility] [place] [facility] [place] [facility]      anxiety      Schizophrenia, unspecified      Nov 03, 2018 [place] [facility] [place] [facility]      Triage A      Anxiety      Anxiety disorder, unspecified      Oct 02, 2018 [facility] [place] [facility] [place] [facility]      Fever      Generalized Body Aches      Myalgia, unspecified site      Sep 23, 2018 [facility] [place] [facility] [place] [facility]      Leg Pain      Cold Exposure      Pain in right leg      Bipolar disorder, unspecified      Reaction to severe stress, unspecified      Sep 18, 2018 [place] [facility] [place] [facility]      Depression  Major depressive disorder, single episode, unspecified      Aug 22, 2018 [place] [facility] [place] [facility] [place] [facility]      SOB      Shortness of Breath      Myalgia, unspecified site      Jul 31, 2018 [facility] [place] [facility] [place] [facility]      Depression      Psychiatric Evaluation      Major depressive disorder, single episode, unspecified      Homelessness          Recent Inpatient Visit Summary  No recorded inpatient visits.     Care Team  Provider Specialty Phone Fax Service Dates   ASSIGNED, NO PCP, CNM Advanced Practice Midwife [phone] Current      Collective Portal  This patient has registered at the [facility]   For more information visit: [email or web address]     PLEASE NOTE:     1.   Any care recommendations and other clinical information are provided as guidelines or for historical purposes only, and providers should exercise their own clinical judgment when providing care.    2.   You may only use this information for purposes of treatment, payment or health care operations activities, and subject to the limitations of applicable Collective  Policies.    3.   You should consult directly with the organization that provided a care guideline or other clinical history with any questions about additional information or accuracy or completeness of information provided.    ? 2021 [place], [employer]. - [email or web address]

## 2019-01-24 ENCOUNTER — Telehealth (HOSPITAL_BASED_OUTPATIENT_CLINIC_OR_DEPARTMENT_OTHER): Payer: Self-pay

## 2019-01-24 NOTE — Telephone Encounter (Signed)
PCCM left voicemail for Pt regarding Care Navigation f/u on recent Emergency Department visit. PCCM is pending call back.     Leovanni Bjorkman, BS    Behavioral Health Care Navigator

## 2019-01-26 ENCOUNTER — Emergency Department
Admission: EM | Admit: 2019-01-26 | Discharge: 2019-01-26 | Disposition: A | Discharge status: Home / Self Care | Payer: Medicaid HMO | Attending: Emergency Medicine | Admitting: Emergency Medicine

## 2019-01-26 DIAGNOSIS — Z008 Encounter for other general examination: Secondary | ICD-10-CM

## 2019-01-26 DIAGNOSIS — X31XXXA Exposure to excessive natural cold, initial encounter: Secondary | ICD-10-CM | POA: Insufficient documentation

## 2019-01-26 DIAGNOSIS — F17210 Nicotine dependence, cigarettes, uncomplicated: Secondary | ICD-10-CM | POA: Insufficient documentation

## 2019-01-26 DIAGNOSIS — F209 Schizophrenia, unspecified: Secondary | ICD-10-CM | POA: Insufficient documentation

## 2019-01-26 DIAGNOSIS — T699XXA Effect of reduced temperature, unspecified, initial encounter: Secondary | ICD-10-CM | POA: Insufficient documentation

## 2019-01-26 DIAGNOSIS — Z590 Homelessness: Secondary | ICD-10-CM | POA: Insufficient documentation

## 2019-01-26 DIAGNOSIS — F319 Bipolar disorder, unspecified: Secondary | ICD-10-CM | POA: Insufficient documentation

## 2019-01-26 LAB — URINALYSIS, REFLEX TO MICROSCOPIC EXAM IF INDICATED
Bilirubin, UA: NEGATIVE
Blood, UA: NEGATIVE
Glucose, UA: NEGATIVE
Ketones UA: NEGATIVE
Leukocyte Esterase, UA: NEGATIVE
Nitrite, UA: NEGATIVE
Protein, UR: NEGATIVE
Specific Gravity UA: 1.024 (ref 1.001–1.035)
Urine pH: 6 (ref 5.0–8.0)
Urobilinogen, UA: 2 mg/dL (ref 0.2–2.0)

## 2019-01-26 LAB — ECG 12-LEAD
Atrial Rate: 78 {beats}/min
Atrial Rate: 79 {beats}/min
P Axis: 136 degrees
P Axis: 40 degrees
P-R Interval: 144 ms
P-R Interval: 148 ms
Q-T Interval: 384 ms
Q-T Interval: 388 ms
QRS Duration: 98 ms
QRS Duration: 98 ms
QTC Calculation (Bezet): 440 ms
QTC Calculation (Bezet): 442 ms
R Axis: 119 degrees
R Axis: 73 degrees
T Axis: -1 degrees
T Axis: 180 degrees
Ventricular Rate: 78 {beats}/min
Ventricular Rate: 79 {beats}/min

## 2019-01-26 LAB — RAPID DRUG SCREEN, URINE
Barbiturate Screen, UR: NEGATIVE
Benzodiazepine Screen, UR: NEGATIVE
Cannabinoid Screen, UR: NEGATIVE
Cocaine, UR: NEGATIVE
Opiate Screen, UR: NEGATIVE
PCP Screen, UR: NEGATIVE
Urine Amphetamine Screen: NEGATIVE

## 2019-01-26 NOTE — ED Notes (Signed)
Pt refusing blood work from Chief Operating Officer members. Effie Shy, MD aware and at bedside.

## 2019-01-26 NOTE — ED Provider Notes (Signed)
EMERGENCY DEPARTMENT HISTORY AND PHYSICAL EXAM    Date: 01/26/19  Patient Name: [name]  Attending Physician: Trudie Buckler, MD      Disposition and Treatment Plan    Clinical Impression:   1. Cold exposure, initial encounter      Disposition:   ED Disposition     ED Disposition Condition Date/Time Comment    Discharge  Sat Jan 26, 2019  3:57 AM Laurence Slate discharge to home/self care.    Condition at disposition: Stable               History of Presenting Illness     Chief Complaint:   Chief Complaint   Patient presents with    Psychiatric Evaluation     [name] is a 44 y.o. male who  has a past medical history of Manic bipolar I disorder and Schizophrenia. BIBA for "psychiatric evaluation."  Pt is homeless, but was "not able to get it together" to go to a shelter tonight.  Was on psych meds previously (unsure which), but not for the past 2 months.  Notes depression.  Denies SI/HI, AH/VH.  Talking about the IRS taking his money.  This history was obtained from pt.     PCP: Valetta Fuller, MD      Past Medical History     Past Medical History:   Diagnosis Date    Manic bipolar I disorder     Schizophrenia        History reviewed. No pertinent surgical history.    Family History     History reviewed. No pertinent family history.    Social History     Social History     Socioeconomic History    Marital status: Single     Spouse name: Not on file    Number of children: Not on file    Years of education: Not on file    Highest education level: Not on file   Occupational History    Not on file   Social Needs    Financial resource strain: Not on file    Food insecurity     Worry: Not on file     Inability: Not on file    Transportation needs     Medical: Not on file     Non-medical: Not on file   Tobacco Use    Smoking status: Current Every Day Smoker     Packs/day: 2.00    Smokeless tobacco: Never Used   Substance and Sexual Activity    Alcohol use: Yes     Comment: 12pack of  beer everyday     Drug use: Never    Sexual activity: Not on file   Lifestyle    Physical activity     Days per week: Not on file     Minutes per session: Not on file    Stress: Not on file   Relationships    Social connections     Talks on phone: Not on file     Gets together: Not on file     Attends religious service: Not on file     Active member of club or organization: Not on file     Attends meetings of clubs or organizations: Not on file     Relationship status: Not on file    Intimate partner violence     Fear of current or ex partner: Not on file     Emotionally abused: Not on file  Physically abused: Not on file     Forced sexual activity: Not on file   Other Topics Concern    Not on file   Social History Narrative    Not on file        Allergies     No Known Allergies    Medications     No current facility-administered medications for this encounter.   No current outpatient medications on file.    Review of Systems     Pertinent Positives and Negatives noted in the HPI.  All Other Systems Reviewed and Negative: Yes    Physical Exam   Physical Exam   Nursing note and vitals reviewed.  Constitutional: A&Ox3, NAD  Head: NCAT   ENT: Oropharynx is clear, MMM  Eyes: Conjunctivae normal  Neck: Normal range of motion, neck supple, no JVD present  Cardiovascular: RRR, no M/G/R  Pulmonary/Chest: CTAB, no respiratory distress  Abdominal: Soft, NT, ND  Musculoskeletal: No edema  Neurological: A&Ox3  Skin: Skin is warm and dry  Psychiatric: Tangential    Diagnostic Study Results     Labs -  Labs Reviewed   RAPID DRUG SCREEN, URINE   URINALYSIS, REFLEX TO MICROSCOPIC EXAM IF INDICATED   COMPREHENSIVE METABOLIC PANEL   MAGNESIUM   PHOSPHORUS   CBC AND DIFFERENTIAL   ETHANOL   ACETAMINOPHEN LEVEL   SALICYLATE LEVEL   TSH   GFR       Radiologic Studies -   No orders to display         Clinical Course in the Emergency Department   3:50 AM  Pt refused blood draw multiple times    4:00   SW to  evaluate      Medical Decision Making   I am the first provider for this patient.  I reviewed the vital signs, nursing notes, past medical history, past surgical history, family history and social history.  I have reviewed the patient's previous charts.    44 y/o homeless male with schizophrenia BIBA for evaluation.  Was unable to get into shelter.  Initially expressed desire to speak with psych, but became more agitated while in ED, refused lab draws on multiple occasions, and was ultimately discharged after SW helped arrange for pt to get to shelter.    Vital Signs - BP 117/67    Pulse 90    Temp 97.7 F (36.5 C) (Temporal)    Resp 18    Ht 6' (1.829 m)    Wt 121.1 kg    SpO2 100%    BMI 36.21 kg/m    Pulse Oximetry Analysis - Normal  Laboratory results reviewed and interpreted by EDP: Yes  Radiologic study results reviewed by EDP: N/A  Radiologic Studies Interpreted (viewed) by EDP: N/A      EKG Analysis reviewed and interpreted by me: NSR, normal axis, HR 78, nonspecific ST changes    Cardiac Monitor interpretation: NSR with HR 90    Critical Care Time: No Critical Care Time    Diagnosis and Treatment Plan       _______________________________    Attestations:  I was acting as a scribe for Trudie Buckler, MD on Fiorillo,Dio Bruce       I am the first provider for this patient and I personally performed the services documented.  is scribing for me on Denio,Reeve Bruce. This note accurately reflects work and decisions made by me.  Trudie Buckler, MD      _______________________________  Newell Coral, MD  01/26/19 [phone]

## 2019-01-26 NOTE — ED Notes (Signed)
Bed: S 9  Expected date:   Expected time:   Means of arrival:   Comments:  Medic 430

## 2019-01-26 NOTE — ED Notes (Signed)
Pt being uncooperative with blood draw at this time. Has refused to let other staff members besides this RN attempt. Attempted blood draw x 1 stick, pt pulled butterfly prior to placing first tube for collection. Pt was cooperative for urine specimen and ekg.  Dr. Effie Shy aware.

## 2019-01-26 NOTE — Discharge Instructions (Signed)
Dear [name]:    Thank you for choosing the [facility] Emergency Department, the premier emergency department in the [place] area.  I hope your visit today was EXCELLENT. You will receive a survey via text message that will give you the opportunity to provide feedback to your team about your visit. Please do not hesitate to reach out with any questions!    Specific instructions for your visit today:      [facility]  [facility] supports [facility]'s overall mission to promote total wellness- mind and body- by offering a full spectrum of mental health and addiction treatment services to the [place] metropolitan area.      DESCRIPTION OF SERVICES  [facility]. [facility] provides a unique and valuable resource to our community by offering an urgent psychiatric assessment. Typically the wait to see an outpatient psychiatrist in the [place] area is at least a 1-2 months, and a majority of those do not accept insurance. At [facility], a patient can schedule or walk in to have an urgent appointment (preferred to decrease wait time) with a psychiatrist from [facility] 8PM every day. [facility] clinicians can be accessed by phone 24/7 [phone]). Most insurances are accepted and self pay options are available as well. [facility] can refer and place clients into one of our mental health or addiction programs depending on their treatment needs.    [facility]  [address]  [address]  [place], [place] [address]  [phone]    Inpatient Services Our Inpatient Psychiatric Units are located within the [place], [place], and [employer]. Our renowned [facility] inpatient medical detoxification unit is located within the [employer]. Our inpatient units provide our patients with safety, structure, and stabilization via medication management and therapeutic group activities held throughout the day. Our  multi-disciplinary team includes physicians, nurses, therapists, and case managers.    Day Treatment/Partial Hospitalization Program (PHP). Brief, supportive and structured crisis intervention with supportive, short term and solution focused group therapy. Each PHP consists of daily therapeutic and educational groups with either a mental health or addiction recovery emphasis. Medication evaluation and management are provided by a physician as needed. Locations at our [place], [place], and [place] outpatient centers. The overall length of stay varies on a case-by-case basis but is usually one to two weeks.    Intensive Outpatient Program (IOP). Intensive recovery group services structured to address the needs of those in early recovery from chemical dependence. Each IOP meets three times a week, three hours at a time, for a total of ten weeks. Day and Evening time are offered. Locations are at [facility] [place] and [place] outpatient centers. A Dual Diagnosis IOP is also available for those experiencing a co-occurring chemical dependence and psychiatric diagnosis.    Outpatient Medication Management Services Psychiatric evaluation and medication management performed by a physician or nurse practitioner on an outpatient basis. This includes our [facility] that provides long acting Zyprexa, Haldol, and Risperdal administered intramuscularly by a registered nurse. To assist in recovery, we also offer Medication Assisted Therapy which consists of Suboxone and Vivitrol induction and management prescribed and supervised by a physician and administered by a registered nurse. Hepatitis B vaccine also available. Services available at our [place], [place], and [place] locations.    Counseling Services offering individual and family psychotherapy are available. Bariatric counseling is given in conjunction with 's renowned Bariatric Surgical team.  For addiction recovery, we offer family  awareness groups, sober living and relapse prevention groups. Irena Reichmann offers adolescents Dialectical Behavioral Therapy (DBT) groups, skill groups and psychotherapy groups.      SERVICE LOCATIONS    [facility]  [address]  [address]  [place], [place] [address]  . Psychiatric or Addiction Crisis Evaluation  . By appointment (preferred) or by appointment  . [facility] [employer] 24/7 [phone]    [facility]  [address]  [place], [place] [address]  . Inpatient Psychiatry  . [facility] inpatient medical detoxification  . [facility] day treatment (PHP)    [facility]  [address]  [place], [place] [address]  . Mental Health Day Treatment (PHP)  . [facility] Intensive Outpatient Program (IOP)  . Medication Management Services  . Counseling Services- Individual     [facility]  [address]  [place], [place] [address]  [phone]  . Mental Health Day Treatment (PHP)  . Mental Health Intensive Outpatient Program (IOP)  . Medication Management Services  . Counseling Services- Autism Spectrum, ADD and educational testing, Individual and Family Psychotherapy,     DBT groups  . Therapeutic Day School    [facility]  [address]  [place], [place] [address]  . Inpatient Psychiatry    [facility] [place] [facility]  [address]  [address]  [place], [place] [address]  . Mental Health Day Treatment (PHP)  . [facility] Intensive Outpatient Program (IOP)  . Medication Management Services  . Counseling Services- Individual Psychotherapy    [facility]  [address] [place], [place] [address]  . Inpatient Psychiatry    [facility]  [address] [place], [place] [address]  . Mental Health Day Treatment (PHP)  . [facility] Intensive Outpatient Program (IOP)  . Medication Management  Services    [facility]  [address]  [place], [place] [address]  . Bariatric Counseling      ADMISSIONS    Adults: [phone]  Adolescents: [phone]                  IF YOU DO NOT CONTINUE TO IMPROVE OR YOUR CONDITION WORSENS, PLEASE CONTACT YOUR DOCTOR OR RETURN IMMEDIATELY TO THE EMERGENCY DEPARTMENT.    Sincerely,  Effie Shy Wendee Beavers, MD  Attending Emergency Physician  [facility] Emergency Department    ONSITE PHARMACY  Our full service onsite pharmacy is located in the ER waiting room.  Open 7 days a week from 9 am to 9 pm.  We accept all major insurances and prices are competitive with major retailers.  Ask your provider to print your prescriptions down to the pharmacy to speed you on your way home.    OBTAINING A PRIMARY CARE APPOINTMENT    Primary care physicians (PCPs, also known as primary care doctors) are either internists or family medicine doctors. Both types of PCPs focus on health promotion, disease prevention, patient education and counseling, and treatment of acute and chronic medical conditions.    Call for an appointment with a primary care doctor.  Ask to see who is taking new patients.     [facility]  telephone:  [phone]  [email or web address]    DOCTOR REFERRALS  Call [phone] (available 24 hours a day, 7 days  a week) if you need any further referrals and we can help you find a primary care doctor or specialist.  Also, available online at:  [email or web address]    YOUR CONTACT INFORMATION  Before leaving please check with registration to make sure we have an up-to-date contact number.  You can call registration at [phone] to update your information.  For questions about your hospital bill, please call [phone].  For questions about your Emergency Dept Physician bill please call [phone].      [place]  If you need help with health or social services, please call 2-1-1 for a free  referral to resources in your area.  2-1-1 is a free service connecting people with information on health insurance, free clinics, pregnancy, mental health, dental care, food assistance, housing, and substance abuse counseling.  Also, available online at:  http://www.211[place].org    MEDICAL RECORDS AND TESTS  Certain laboratory test results do not come back the same day, for example urine cultures.   We will contact you if other important findings are noted.  Radiology films are often reviewed again to ensure accuracy.  If there is any discrepancy, we will notify you.      Please call [phone] to pick up a complimentary CD of any radiology studies performed.  If you or your doctor would like to request a copy of your medical records, please call [phone].      ORTHOPEDIC INJURY   Please know that significant injuries can exist even when an initial x-ray is read as normal or negative.  This can occur because some fractures (broken bones) are not initially visible on x-rays.  For this reason, close outpatient follow-up with your primary care doctor or bone specialist (orthopedist) is required.    MEDICATIONS AND FOLLOWUP  Please be aware that some prescription medications can cause drowsiness.  Use caution when driving or operating machinery.    The examination and treatment you have received in our Emergency Department is provided on an emergency basis, and is not intended to be a substitute for your primary care physician.  It is important that your doctor checks you again and that you report any new or remaining problems at that time.      [place]  The nearest 24 hour pharmacy is:    [facility] [place] [place] [address]  [place]  [facility])  Call to start or finish an application, compare plans, enroll or ask a question.  [place]: [phone]  Web:  Healthcare.gov    Help Enrolling in  [place]  631-166-1300 (TOLL-FREE)  (845)666-2786 (TTY)  Web:  Http://www.coverva.org    Local Help Enrolling in the [place]  (604)033-3310 (MAIN)  Email:  health-help_0 .org  Web:  http://lewis-perez.info/  Address:  [address], [address] [place], [place] [address]    SEDATING MEDICATIONS  Sedating medications include strong pain medications (e.g. narcotics), muscle relaxers, benzodiazepines (used for anxiety and as muscle relaxers), Benadryl/diphenhydramine and other antihistamines for allergic reactions/itching, and other medications.  If you are unsure if you have received a sedating medication, please ask your physician or nurse.  If you received a sedating medication: DO NOT drive a car. DO NOT operate machinery. DO NOT perform jobs where you need to be alert.  DO NOT drink alcoholic beverages while taking  this medicine.     If you get dizzy, sit or lie down at the first signs. Be careful going up and down stairs.  Be extra careful to prevent falls.     Never give this medicine to others.     Keep this medicine out of reach of children.     Do not take or save old medicines. Throw them away when outdated.     Keep all medicines in a cool, dry place. DO NOT keep them in your bathroom medicine cabinet or in a cabinet above the stove.    MEDICATION REFILLS  Please be aware that we cannot refill any prescriptions through the ER. If you need further treatment from what is provided at your ER visit, please follow up with your primary care doctor or your pain management specialist.    [place]  Did you know [facility] has two freestanding ERs located just a few miles away?  [facility] [place] have short wait times, easy free parking directly in front of the building and top patient satisfaction scores - and the same Board Certified Emergency Medicine doctors as [facility].

## 2019-01-26 NOTE — Progress Notes (Signed)
Systems Case Management Progress Note:     Type of Services Provider Name   Provider Phone Number   Length of Need SCM approved by:  Comments:   Skilled Nursing Facility "SNF"        Assisted Living        LTAC        Dialysis        Home Health        Infusion        DME        Medications        Transportation Cab voucher  1x M. Fanny Dance Patint stated he is homeless. Provided with hypothermia program for shelters. Patient wanting to return to [facility] shelter program: [address] [place] , [place]   Non-skilled Care ie Private Duty Aide        Johny Blamer, MSW  Social Worker II  [phone]

## 2019-01-26 NOTE — EDIE (Signed)
COLLECTIVE?NOTIFICATION?01/26/2019 02:15?DEWELL, CAGLE B?MRN: [id number]    Criteria Met      3 Different Facilities in 90 Days    5 ED Visits in 12 Months    Security and Safety  No recent Security Events currently on file    ED Care Guidelines  There are currently no ED Care Guidelines for this patient. Please check your facility's medical records system.        Prescription Monitoring Program  000??- Narcotic Use Score  000??- Sedative Use Score  000??- Stimulant Use Score  000??- Overdose Risk Score  - All Scores range from 000-999 with 75% of the population scoring < 200 and on 1% scoring above 650  - The last digit of the narcotic, sedative, and stimulant score indicates the number of active prescriptions of that type  - Higher Use scores correlate with increased prescribers, pharmacies, mg equiv, and overlapping prescriptions  - Higher Overdose Risk Scores correlate with increased risk of unintentional overdose death   Concerning or unexpectedly high scores should prompt a review of the PMP record; this does not constitute checking PMP for prescribing purposes.      E.D. Visit Count (12 mo.)  Facility Visits   [facility] 4   [facility] 1   [facility] 2   [facility] 1   [facility] 5   [facility] 3   [facility] 1   [facility] 5   Total 22   Note: Visits indicate total known visits.     Recent Emergency Department Visit Summary  Showing 10 most recent visits out of 22 in the past 12 months  Date Facility [facility] Type Diagnoses or Chief Complaint   Jan 26, 2019 [place] [facility] [place] [place] [facility]      Homeless      Jan 23, 2019 [place] [facility] [place] [place] [facility]      Psych evaluation      Psychiatric Evaluation      Unspecified psychosis not due to a substance or known physiological condition      Jan 16, 2019 [place] [facility] [place] [place] [facility]      psych problem       Psychiatric Evaluation      Other symptoms and signs involving appearance and behavior      Other symptoms and signs involving cognitive functions and awareness      Nov 06, 2018 [place] [facility] [place] [place] [facility]      DEPRESSION      Psychiatric Evaluation      Schizophrenia, unspecified      Homelessness      Nov 05, 2018 [facility] [facility]  [place] [place] [facility]      anxiety      Schizophrenia, unspecified      Nov 03, 2018 [place] [facility] [place] [facility]      Triage A      Anxiety      Anxiety disorder, unspecified      Oct 02, 2018 [facility] [place] [facility] [place] [place] [facility]      Fever      Generalized Body Aches      Myalgia, unspecified site      Sep 23, 2018 [facility] [place] [facility] [place]  Emergency      Leg Pain      Cold Exposure  Pain in right leg      Bipolar disorder, unspecified      Reaction to severe stress, unspecified      Sep 18, 2018 [place] [facility] [place] [place] [facility]      Depression      Major depressive disorder, single episode, unspecified      Aug 22, 2018 [place] [facility] [place] [facility] [place] [place] [facility]      SOB      Shortness of Breath      Myalgia, unspecified site          Recent Inpatient Visit Summary  No recorded inpatient visits.     Care Team  Provider Specialty Phone Fax Service Dates   ASSIGNED, NO PCP, CNM Advanced Practice Midwife [phone] Current      Collective Portal  This patient has registered at the [facility] Emergency Department   For more information visit: [email or web address]     PLEASE NOTE:     1.   Any care recommendations and other clinical information are provided as guidelines or for historical purposes only, and providers should exercise their own clinical judgment when providing care.    2.   You may only use this information for purposes of treatment, payment or health care operations activities, and subject to the limitations of applicable  Collective Policies.    3.   You should consult directly with the organization that provided a care guideline or other clinical history with any questions about additional information or accuracy or completeness of information provided.    ? 2021 [place] - [email or web address][employer]uk

## 2019-01-26 NOTE — ED Triage Notes (Signed)
Pt presents to ER via EMS. Per EMS pt found at [facility] and wasn't able to get to shelter in time before they closed. Pt reports hx of depression and schizophrenia and has been out of meds x a few months. Denies homicidal or suicidal ideations. Offers no complaints at this time. MD at bedside upon arrival.

## 2019-01-26 NOTE — ED Notes (Signed)
Pt uncooperative with staff for discharge and escorted out of ED by security. Pt refused vitals at discharge, Effie Shy, MD aware. Social worker arranging taxi ride for pt back to shelter.

## 2019-02-05 ENCOUNTER — Telehealth (HOSPITAL_BASED_OUTPATIENT_CLINIC_OR_DEPARTMENT_OTHER): Payer: Self-pay

## 2019-02-05 DIAGNOSIS — Z590 Homelessness: Secondary | ICD-10-CM | POA: Insufficient documentation

## 2019-02-05 DIAGNOSIS — Z760 Encounter for issue of repeat prescription: Secondary | ICD-10-CM | POA: Insufficient documentation

## 2019-02-05 DIAGNOSIS — F17210 Nicotine dependence, cigarettes, uncomplicated: Secondary | ICD-10-CM | POA: Insufficient documentation

## 2019-02-05 NOTE — Telephone Encounter (Signed)
PCCM's second unsuccessful attempt at contacting Pt for f/u regarding recent Emergency Department visit. PCCM left voicemail for Pt, and is pending call back.    Wren Gallaga, BS    Patient Care Coordination Manager

## 2019-02-05 NOTE — EDIE (Signed)
COLLECTIVE?NOTIFICATION?02/05/2019 23:50?[name], [name]?MRN: [id number]    Criteria Met      5 ED Visits in 12 Months    Security and Safety  No recent Security Events currently on file    ED Care Guidelines  There are currently no ED Care Guidelines for this patient. Please check your facility's medical records system.        Prescription Monitoring Program  000??- Narcotic Use Score  000??- Sedative Use Score  000??- Stimulant Use Score  000??- Overdose Risk Score  - All Scores range from 000-999 with 75% of the population scoring < 200 and on 1% scoring above 650  - The last digit of the narcotic, sedative, and stimulant score indicates the number of active prescriptions of that type  - Higher Use scores correlate with increased prescribers, pharmacies, mg equiv, and overlapping prescriptions  - Higher Overdose Risk Scores correlate with increased risk of unintentional overdose death   Concerning or unexpectedly high scores should prompt a review of the PMP record; this does not constitute checking PMP for prescribing purposes.      E.D. Visit Count (12 mo.)  Facility Visits   [facility] 3   [facility] 1   [facility] 2   [facility] 1   [facility] 5   [facility] 3   [facility] 1   [facility] 6   Total 22   Note: Visits indicate total known visits.     Recent Emergency Department Visit Summary  Showing 10 most recent visits out of 22 in the past 12 months  Date Facility [facility] Type Diagnoses or Chief Complaint   Feb 05, 2019 [place] [facility] [place] [place] [facility]      Psychiatric Eval      Jan 26, 2019 [place] [facility] [place] [place] [facility]      Homeless      Psychiatric Evaluation      Effect of reduced temperature, unspecified, initial encounter      Jan 23, 2019 [place] [facility] [place] [facility] [place] [place] [facility]      Psych evaluation      Psychiatric Evaluation      Unspecified psychosis not  due to a substance or known physiological condition      Jan 16, 2019 [place] [facility] [place] [facility] [place] [place] [facility]      psych problem      Psychiatric Evaluation      Other symptoms and signs involving appearance and behavior      Other symptoms and signs involving cognitive functions and awareness      Nov 06, 2018 [place] [facility] [place] [place] [facility]      DEPRESSION      Psychiatric Evaluation      Schizophrenia, unspecified      Homelessness      Nov 05, 2018 [place] [facility] [place] [place] [facility]      anxiety      Schizophrenia, unspecified      Nov 03, 2018 [place] [facility] [place] [place] [facility]      Triage A      Anxiety      Anxiety disorder, unspecified      Oct 02, 2018 [place] [facility] [place]  Emergency      Fever      Generalized Body Aches      Myalgia, unspecified site  Sep 23, 2018 [place] [facility]      Leg Pain      Cold Exposure      Pain in right leg      Bipolar disorder, unspecified      Reaction to severe stress, unspecified      Sep 18, 2018 [place] [facility] [place] [place] [facility]      Depression      Major depressive disorder, single episode, unspecified          Recent Inpatient Visit Summary  No recorded inpatient visits.     Care Team  Provider Specialty Phone Fax Service Dates   ASSIGNED, NO PCP, CNM Advanced Practice Midwife [phone] Current      Collective Portal  This patient has registered at the [facility] Emergency Department   For more information visit: [email or web address]     PLEASE NOTE:     1.   Any care recommendations and other clinical information are provided as guidelines or for historical purposes only, and providers should exercise their own clinical judgment when providing care.    2.   You may only use this information for purposes of treatment, payment or health care operations activities, and subject to the limitations of  applicable Collective Policies.    3.   You should consult directly with the organization that provided a care guideline or other clinical history with any questions about additional information or accuracy or completeness of information provided.    ? 2021 [place], [employer]. - [email or web address]

## 2019-02-06 ENCOUNTER — Emergency Department
Admission: EM | Admit: 2019-02-06 | Discharge: 2019-02-06 | Disposition: A | Discharge status: Home / Self Care | Payer: Medicaid HMO | Attending: Emergency Medicine | Admitting: Emergency Medicine

## 2019-02-06 DIAGNOSIS — Z760 Encounter for issue of repeat prescription: Secondary | ICD-10-CM

## 2019-02-06 DIAGNOSIS — Z5900 Homelessness unspecified: Secondary | ICD-10-CM

## 2019-02-06 LAB — ECG 12-LEAD
Atrial Rate: 96 {beats}/min
P Axis: 68 degrees
P-R Interval: 160 ms
Q-T Interval: 342 ms
QRS Duration: 92 ms
QTC Calculation (Bezet): 432 ms
R Axis: 68 degrees
T Axis: 37 degrees
Ventricular Rate: 96 {beats}/min

## 2019-02-06 MED ORDER — RISPERIDONE 2 MG PO TBDP
2.00 mg | ORAL_TABLET | Freq: Once | ORAL | Status: AC
Start: 2019-02-06 — End: 2019-02-06
  Administered 2019-02-06: 01:00:00 2 mg via ORAL
  Filled 2019-02-06: qty 1
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
## 2019-02-06 NOTE — Discharge Instructions (Signed)
Dear Mr. Chesmore:    Thank you for choosing the [facility] Emergency Department, the premier emergency department in the [place] area.  I hope your visit today was EXCELLENT. You will receive a survey via text message that will give you the opportunity to provide feedback to your team about your visit. Please do not hesitate to reach out with any questions!    Specific instructions for your visit today:      Medication Refill    You have been given a refill of one or more of your regular medicines.    In emergency situations, the doctor can give you a temporary refill for the medicines you need. However, you need to follow up with your regular doctor for future refills.    Some medicines can cause significant symptoms when stopped suddenly. Therefore, it is important to make plans for getting refills before you run out of medicines.    Return here or go to the nearest Emergency Department if:   You need an emergency refill on your medications and you cannot get in touch with your doctor.                 IF YOU DO NOT CONTINUE TO IMPROVE OR YOUR CONDITION WORSENS, PLEASE CONTACT YOUR DOCTOR OR RETURN IMMEDIATELY TO THE EMERGENCY DEPARTMENT.    Sincerely,  Theodus Ran, Mohammed Kindle,*  Attending Emergency Physician  [facility] Emergency Department    ONSITE PHARMACY  Our full service onsite pharmacy is located in the ER waiting room.  Open 7 days a week from 9 am to 9 pm.  We accept all major insurances and prices are competitive with major retailers.  Ask your provider to print your prescriptions down to the pharmacy to speed you on your way home.    OBTAINING A PRIMARY CARE APPOINTMENT    Primary care physicians (PCPs, also known as primary care doctors) are either internists or family medicine doctors. Both types of PCPs focus on health promotion, disease prevention, patient education and counseling, and treatment of acute and chronic medical conditions.    Call for an appointment with a primary  care doctor.  Ask to see who is taking new patients.     [facility]  telephone:  [phone]  [email or web address]    DOCTOR REFERRALS  Call [phone] (available 24 hours a day, 7 days a week) if you need any further referrals and we can help you find a primary care doctor or specialist.  Also, available online at:  [email or web address]    YOUR CONTACT INFORMATION  Before leaving please check with registration to make sure we have an up-to-date contact number.  You can call registration at [phone] to update your information.  For questions about your hospital bill, please call [phone].  For questions about your Emergency Dept Physician bill please call [phone].      FREE HEALTH SERVICES  If you need help with health or social services, please call 2-1-1 for a free referral to resources in your area.  2-1-1 is a free service connecting people with information on health insurance, free clinics, pregnancy, mental health, dental care, food assistance, housing, and substance abuse counseling.  Also, available online at:  http://www.211[place].org    MEDICAL RECORDS AND TESTS  Certain laboratory test results do not come back the same day, for example urine cultures.   We will contact you if other important findings are noted.  Radiology films are often reviewed again to ensure accuracy.  If there is any discrepancy, we will notify you.      Please call [phone] to pick up a complimentary CD of any radiology studies performed.  If you or your doctor would like to request a copy of your medical records, please call [phone].      ORTHOPEDIC INJURY   Please know that significant injuries can exist even when an initial x-ray is read as normal or negative.  This can occur because some fractures (broken bones) are not initially visible on x-rays.  For this reason, close outpatient follow-up with your primary care doctor or bone specialist (orthopedist) is  required.    MEDICATIONS AND FOLLOWUP  Please be aware that some prescription medications can cause drowsiness.  Use caution when driving or operating machinery.    The examination and treatment you have received in our Emergency Department is provided on an emergency basis, and is not intended to be a substitute for your primary care physician.  It is important that your doctor checks you again and that you report any new or remaining problems at that time.      24 HOUR PHARMACIES  The nearest 24 hour pharmacy is:    [facility]  [address]  [place], [place] [address]  [phone]      ASSISTANCE WITH INSURANCE    Affordable Care Act  [facility])  Call to start or finish an application, compare plans, enroll or ask a question.  [phone]  TTY: [phone]  Web:  Healthcare.gov    Help Enrolling in [facility]  [facility] [place]  (317) [phone] (TOLL-FREE)  4173082492 (TTY)  Web:  Http://www.coverva.org    Local Help Enrolling in the [facility]  [place] [facility]  3034548537 (MAIN)  Email:  health-help@nvfs .org  Web:  BlackjackMyths.is  Address:  [address], [address] [place], [place] [address]    SEDATING MEDICATIONS  Sedating medications include strong pain medications (e.g. narcotics), muscle relaxers, benzodiazepines (used for anxiety and as muscle relaxers), Benadryl/diphenhydramine and other antihistamines for allergic reactions/itching, and other medications.  If you are unsure if you have received a sedating medication, please ask your physician or nurse.  If you received a sedating medication: DO NOT drive a car. DO NOT operate machinery. DO NOT perform jobs where you need to be alert.  DO NOT drink alcoholic beverages while taking this medicine.     If you get dizzy, sit or lie down at the first signs. Be careful going up and down stairs.  Be extra careful to prevent falls.     Never give this medicine to others.     Keep this medicine out of reach of children.      Do not take or save old medicines. Throw them away when outdated.     Keep all medicines in a cool, dry place. DO NOT keep them in your bathroom medicine cabinet or in a cabinet above the stove.    MEDICATION REFILLS  Please be aware that we cannot refill any prescriptions through the ER. If you need further treatment from what is provided at your ER visit, please follow up with your primary care doctor or your pain management specialist.    FREESTANDING EMERGENCY DEPARTMENTS OF [facility]  Did you know [facility] has two freestanding ERs located just a few miles away?  [facility] [place] and [facility] [place] have short wait times, easy free parking directly in front of the building and  top patient satisfaction scores - and the same Board Certified Emergency Medicine doctors as [facility].

## 2019-02-06 NOTE — ED Notes (Signed)
Spoke with SW, Joni Reining to set up ride home for patient.

## 2019-02-06 NOTE — ED Notes (Signed)
Pt. refusing blood work. Dr. Lonna Duval aware.

## 2019-02-06 NOTE — ED Provider Notes (Signed)
[facility] EMERGENCY DEPARTMENT H&P      Visit date: 02/06/2019      CLINICAL SUMMARY          Diagnosis:    .     Final diagnoses:   Homeless   Medication refill         MDM Notes:      [name] is a 44 y.o. old male presenting with nonemergent complaints of people bothering him in a chronic problem that his body is not working. Vitals stable. Nursing notes reviewed. Exam as listed.  Attempted to do blood work for psychiatric evaluation here, but patient refuses.  Patient appears to have decompensated schizophrenia but no evidence of responding to internal stimuli or obvious suicidality or homicidality.  No evidence of meeting criteria for ECO or TDO.  Patient amenable to taking his psychiatric medications but unfortunately unable to list them.  On review of records, found list of previous medications tried with patient which she is only able to identify Risperdal.  Discussed with him the importance of having close psychiatric follow-up and evaluation to understand what medications he should be chronically on.  He says that he would like 1 dose of the medication here and will be able to get the remainder of his medication with the psychiatric team follow-up next week.  As patient does not have any obvious medical or psychiatric emergencies identified on evaluation today, provided with one-time dose of Risperdal along with behavioral health follow-up and social work assistance to return to home a shelter.    Discussed warning signs and instructed patient to return to the ED if he develops any new or worsening symptoms. Patient voiced understanding of instructions, questions were answered and the patient was discharged home. Patient remained stable during the rest of his ED course.         Disposition:         Discharge        Discharge Prescriptions     None                         CLINICAL INFORMATION        HPI:      Chief Complaint: Homeless  .    [name] is a 44 y.o.  male with history of bipolar and schizophrenia who presents by EMS from [facility] shelter with multiple complaints.  Patient reports he is here to get a pill of his medication to help him with the people bothering him and the fact that his body is not working.  He reports that his family is mean to him and he is stressed by this.  When trying to further elaborate who he refers to as people, he says others in the shelter.  Denies any auditory or visual donations.  Denies any suicidal ideation or homicidal ideation. Currently denying chest pain, shortness of breath, abdominal pain, nausea, vomiting, diarrhea, headache, unsteady gait, new focal weakness, new rash, fevers.  Denies any alcohol or drug abuse    Unable to recall the list of his home medications.  On review of older records, noted that he has been tried on Risperdal in the past which he identifies.  Unable to identify any of the other medications listed in previous charts.  Says that he already has follow-up with a psychiatrist next week and is able to get the rest of his medications and    History obtained from: Patient  ROS:      Positive and negative ROS elements as per HPI.  All other systems reviewed and negative.      Physical Exam:      Pulse 81   BP 124/75   Resp 16   SpO2 97 %   Temp 97.8 F (36.6 C)    General appearance - alert, disheveled, no acute distress  Mental status - alert, oriented to person, place  Mouth - mucous membranes dry  Neck - supple, no significant adenopathy  Chest -  No audible wheezes, rales or rhonchi, symmetric air entry  Heart - normal rate, regular rhythm, normal S1, S2, no murmurs, rubs, clicks or gallops  Abdomen - soft, nontender, nondistended, no masses or organomegaly  Neurological - alert, normal speech, no focal findings or movement disorder noted  Musculoskeletal - no joint tenderness, deformity or swelling            PAST HISTORY        Primary Care Provider: Valetta Fuller, MD        PMH/PSH:     .     Past Medical History:   Diagnosis Date    Manic bipolar I disorder     Schizophrenia        He has no past surgical history on file.      Social/Family History:      He reports that he has been smoking. He has been smoking about 1.00 pack per day. He has never used smokeless tobacco. He reports previous alcohol use. He reports previous drug use.    History reviewed. No pertinent family history.      Listed Medications on Arrival:    .     Home Medications     Med List Status: In Progress Set By: Novella Olive, RN at 02/06/2019 12:25 AM        No Medications         Allergies: He has No Known Allergies.            VISIT INFORMATION        Clinical Course in the ED:      Throughout the stay in the Emergency Department, questions and concerns surrounding pain management, care plan, diagnostic studies, effects of medications administered or prescribed, and future care plan were assessed and addressed.           Medications Given in the ED:    .     ED Medication Orders (From admission, onward)    Start Ordered     Status Ordering Provider    02/06/19 0056 02/06/19 0056  risperiDONE (RisperDAL M-TABS) disintegrating tablet 2 mg  Once     Route: Oral  Ordered Dose: 2 mg     Last MAR action: Given Joelyn Lover MOHAMMAD            Procedures:      Procedures      Interpretations:                     RESULTS        Lab Results:      Results     ** No results found for the last 24 hours. **              Radiology Results:      No orders to display               Scribe Attestation:      I  was acting as a Neurosurgeon for [employer], Mohammed Kindle, MD on [name],[name]       I am the first provider for this patient and I personally performed the services documented.  is scribing for me on [name],[name]. This note and the patient instructions accurately reflect work and decisions made by me.  Charnice Zwilling, Mohammed Kindle, MD              [facility], Mohammed Kindle, MD  02/09/19 0300

## 2019-03-02 ENCOUNTER — Emergency Department
Admission: EM | Admit: 2019-03-02 | Discharge: 2019-03-02 | Disposition: A | Discharge status: Home / Self Care | Payer: Medicaid HMO | Attending: Emergency Medicine | Admitting: Emergency Medicine

## 2019-03-02 ENCOUNTER — Telehealth (HOSPITAL_BASED_OUTPATIENT_CLINIC_OR_DEPARTMENT_OTHER): Payer: Self-pay

## 2019-03-02 DIAGNOSIS — F419 Anxiety disorder, unspecified: Secondary | ICD-10-CM | POA: Insufficient documentation

## 2019-03-02 DIAGNOSIS — F17210 Nicotine dependence, cigarettes, uncomplicated: Secondary | ICD-10-CM | POA: Insufficient documentation

## 2019-03-02 DIAGNOSIS — F3111 Bipolar disorder, current episode manic without psychotic features, mild: Secondary | ICD-10-CM

## 2019-03-02 DIAGNOSIS — Z008 Encounter for other general examination: Secondary | ICD-10-CM

## 2019-03-02 LAB — COMPREHENSIVE METABOLIC PANEL
ALT: 16 U/L (ref 0–55)
AST (SGOT): 19 U/L (ref 5–34)
Albumin/Globulin Ratio: 1.3 (ref 0.9–2.2)
Albumin: 3.6 g/dL (ref 3.5–5.0)
Alkaline Phosphatase: 64 U/L (ref 38–106)
Anion Gap: 8 (ref 5.0–15.0)
BUN: 13 mg/dL (ref 9.0–28.0)
Bilirubin, Total: 0.4 mg/dL (ref 0.2–1.2)
CO2: 26 mEq/L (ref 22–29)
Calcium: 9 mg/dL (ref 8.5–10.5)
Chloride: 106 mEq/L (ref 100–111)
Creatinine: 0.8 mg/dL (ref 0.7–1.3)
Globulin: 2.8 g/dL (ref 2.0–3.6)
Glucose: 104 mg/dL — ABNORMAL HIGH (ref 70–100)
Potassium: 4.1 mEq/L (ref 3.5–5.1)
Protein, Total: 6.4 g/dL (ref 6.0–8.3)
Sodium: 140 mEq/L (ref 136–145)

## 2019-03-02 LAB — CBC AND DIFFERENTIAL
Absolute NRBC: 0 10*3/uL (ref 0.00–0.00)
Basophils Absolute Automated: 0.04 10*3/uL (ref 0.00–0.08)
Basophils Automated: 0.9 %
Eosinophils Absolute Automated: 0.12 10*3/uL (ref 0.00–0.44)
Eosinophils Automated: 2.6 %
Hematocrit: 42.8 % (ref 37.6–49.6)
Hgb: 14 g/dL (ref 12.5–17.1)
Immature Granulocytes Absolute: 0.01 10*3/uL (ref 0.00–0.07)
Immature Granulocytes: 0.2 %
Lymphocytes Absolute Automated: 2.13 10*3/uL (ref 0.42–3.22)
Lymphocytes Automated: 46.9 %
MCH: 29.4 pg (ref 25.1–33.5)
MCHC: 32.7 g/dL (ref 31.5–35.8)
MCV: 89.7 fL (ref 78.0–96.0)
MPV: 10.9 fL (ref 8.9–12.5)
Monocytes Absolute Automated: 0.56 10*3/uL (ref 0.21–0.85)
Monocytes: 12.3 %
Neutrophils Absolute: 1.68 10*3/uL (ref 1.10–6.33)
Neutrophils: 37.1 %
Nucleated RBC: 0 /100 WBC (ref 0.0–0.0)
Platelets: 194 10*3/uL (ref 142–346)
RBC: 4.77 10*6/uL (ref 4.20–5.90)
RDW: 14 % (ref 11–15)
WBC: 4.54 10*3/uL (ref 3.10–9.50)

## 2019-03-02 LAB — ECG 12-LEAD
Atrial Rate: 85 {beats}/min
P Axis: 49 degrees
P-R Interval: 182 ms
Q-T Interval: 364 ms
QRS Duration: 102 ms
QTC Calculation (Bezet): 433 ms
R Axis: 21 degrees
T Axis: 20 degrees
Ventricular Rate: 85 {beats}/min

## 2019-03-02 LAB — SALICYLATE LEVEL: Salicylate Level: <5 mg/dL — ABNORMAL LOW (ref 15.0–30.0)

## 2019-03-02 LAB — ACETAMINOPHEN LEVEL: Acetaminophen Level: <7 ug/mL — ABNORMAL LOW (ref 10–30)

## 2019-03-02 LAB — TSH: TSH: 1.19 u[IU]/mL (ref 0.35–4.94)

## 2019-03-02 LAB — PHOSPHORUS: Phosphorus: 4 mg/dL (ref 2.3–4.7)

## 2019-03-02 LAB — GFR: EGFR: >60

## 2019-03-02 LAB — ETHANOL: Alcohol: NOT DETECTED mg/dL

## 2019-03-02 LAB — MAGNESIUM: Magnesium: 2.1 mg/dL (ref 1.6–2.6)

## 2019-03-02 NOTE — ED Notes (Signed)
Bed: S 18  Expected date:   Expected time:   Means of arrival:   Comments:  M 429

## 2019-03-02 NOTE — Discharge Instructions (Signed)
PLEASE FOLLOW UP WITH [facility] [phone] RETURN TO THE ER FOR DEPRESSION, THOUGHTS OF HARMING YOURSELF OR OTHERS, HEARING VOICES OR ANY OTHER SYMPTOMS

## 2019-03-02 NOTE — ED Notes (Signed)
Patient is resting comfortably. Pt refuses food/water at this time. Pending UA and psych eval . Cart for psych eval is in room. Will continue to monitor

## 2019-03-02 NOTE — ED Notes (Signed)
Pt put into a green gown

## 2019-03-02 NOTE — ED Provider Notes (Signed)
[facility] EMERGENCY DEPARTMENT H&P        Visit date: 03/02/2019     Assessment & Plan:     44 year old male with history of bipolar disorder, schizophrenia, presenting to this emergency room seeking psychiatry consultation for his anxiety and chronic depression.  No acute changes in terms of the depression.  Wants to be started on medication again however has not followed up as outpatient.  Patient denies repeatedly to myself and staff members that he wants to harm himself or others.  He denies auditory visual hallucination.  Has no somatic complaints.    Clinical course:  Patient is calm and collected.  Resting and tolerating p.o.  Awaiting psychiatry    Clinical course: Patient spoke with psychiatry, they have agreed for outpatient follow-up.  Information for efficacy has been given.  He understands strict return precautions including worsening depression, authorization, suicidal ideation, homicidal ideation, or any other somatic symptoms.  He voiced understanding.  Has a safe method to go back to shelter.     Disposition:           Discharge           I was present for the bedside discharge alongside the patient's ED nurse. Course of visit in the ED and discharge instructions were reviewed with patient and they were given the opportunity to ask any questions regarding their care today. Patient and/ or patient's family verbalized understanding of, and comfort with, instructions and plan.          Discharge Prescriptions     None               History of Present Illness:   Chief Complaint: Anxiety  .    [name] is a 44 y.o. male who presents with " anxiety".  Patient states that he came to the emergency room to speak with a psychiatrist to start his blood pressure medications again.  Patient does not specify the medication at this time.  He has no auditory visual hallucination.  He has no suicidal thoughts or ideation.  He has no homicidal thoughts or ideation.  He has no  somatic complaints at this time.  Patient would not elaborate further on why he wants to talk to psychiatrist other than medication refill.  He denies chest pain, shortness of breath, fever chills, abdominal pain, dizziness, or any other symptoms.    Does drink alcohol but has not drank this morning  Currently in a shelter  Came by ambulance      History obtained from: patient, review of prior chart            Past Medical History:   Primary Care Provider: Valetta Fuller, MD    Past Medical History:   Diagnosis Date    Manic bipolar I disorder     Schizophrenia        He has no past surgical history on file.    Meds:     Home Medications     Med List Status: In Progress Set By: Arvilla Meres, RN at 03/02/2019  6:07 AM        No Medications           Allergies: He has No Known Allergies.      Social & Family History:   He reports that he has been smoking. He has been smoking about 1.00 pack per day. He has never used smokeless tobacco. He reports previous alcohol use. He reports previous drug use.  History reviewed. No pertinent family history.    Review of Systems:     Positive and negative ROS elements as per HPI.  All other systems reviewed and negative.    Physical Exam:     Pulse 92   BP 110/73   Resp 16   SpO2 97 %   Temp 97.9 F (36.6 C)    General: Well appearing, Comfortable, NAD.  Head: Atraumatic, normocephalic.  Eyes: No conjunctival injection. No discharge. EOMI. PERRL.  ENT: Mucous membranes moist.  Neck: Supple, normal range of motion.  Respiratory/Chest: No respiratory distress.  Clear lungs auscultation  Cardiovascular: RRR. No m/g/r. B/l RP & DP +2  Abdomen: NBS. Soft. Non-tender. No guarding/rebound. No distension.   Back: No spinous process or paravertebral tenderness.  Lower Extremity: No edema. No calf tenderness. Warm and well perfused  Neurological: Awake, alert, oriented x 3. Speech smooth. No facial asymmetry. CN II-XII intact. Gait steady and normal. Sensation full to  light touch throughout.  5 out of 5 motor strength  Skin: Warm and dry. No rash.  Psychiatric: Not responding to internal or external stimuli.  Patient is outspoken.  Disheveled appearing.  Linear conversation.  Clear speech.    Medication Given in the ED   .     ED Medication Orders (From admission, onward)    None            Procedures:   .    Procedures       Interpretations:     O2 sat-           saturation: 97 %; Oxygen use: room air; Interpretation: Normal      EKG -             interpreted by me: normal sinus at 85.  Isolated twi in III. No STE/St depression           Labs:     Results     Procedure Component Value Units Date/Time    TSH [[id number]] Collected: 03/02/19 0638    Specimen: Blood Updated: 03/02/19 0753     TSH 1.19 uIU/mL     Acetaminophen level [[id number]]  (Abnormal) Collected: 03/02/19 0638    Specimen: Blood Updated: 03/02/19 0732     Acetaminophen Level <7 ug/mL     Salicylate level [[id number]]  (Abnormal) Collected: 03/02/19 0638    Specimen: Blood Updated: 03/02/19 0732     Salicylate Level <5.0 mg/dL     GFR [[id number]] Collected: 03/02/19 0638     Updated: 03/02/19 0732     EGFR >60.0    Comprehensive metabolic panel [[id number]]  (Abnormal) Collected: 03/02/19 0638    Specimen: Blood Updated: 03/02/19 0732     Glucose 104 mg/dL      BUN 24.4 mg/dL      Creatinine 0.8 mg/dL      Sodium 010 mEq/L      Potassium 4.1 mEq/L      Chloride 106 mEq/L      CO2 26 mEq/L      Calcium 9.0 mg/dL      Protein, Total 6.4 g/dL      Albumin 3.6 g/dL      AST (SGOT) 19 U/L      ALT 16 U/L      Alkaline Phosphatase 64 U/L      Bilirubin, Total 0.4 mg/dL      Globulin 2.8 g/dL      Albumin/Globulin Ratio 1.3     Anion Gap 8.0  Magnesium [[id number]] Collected: 03/02/19 0638    Specimen: Blood Updated: 03/02/19 0732     Magnesium 2.1 mg/dL     Phosphorus [[id number]] Collected: 03/02/19 2952    Specimen: Blood Updated: 03/02/19 0732     Phosphorus 4.0 mg/dL     Ethanol (Alcohol) Level [[id number]] Collected:  03/02/19 0638    Specimen: Blood Updated: 03/02/19 0732     Alcohol None Detected mg/dL     CBC and differential [[id number]] Collected: 03/02/19 0638    Specimen: Blood Updated: 03/02/19 0716     WBC 4.54 x10 3/uL      Hgb 14.0 g/dL      Hematocrit 40.3 %      Platelets 194 x10 3/uL      RBC 4.77 x10 6/uL      MCV 89.7 fL      MCH 29.4 pg      MCHC 32.7 g/dL      RDW 14 %      MPV 10.9 fL      Neutrophils 37.1 %      Lymphocytes Automated 46.9 %      Monocytes 12.3 %      Eosinophils Automated 2.6 %      Basophils Automated 0.9 %      Immature Granulocytes 0.2 %      Nucleated RBC 0.0 /100 WBC      Neutrophils Absolute 1.68 x10 3/uL      Lymphocytes Absolute Automated 2.13 x10 3/uL      Monocytes Absolute Automated 0.56 x10 3/uL      Eosinophils Absolute Automated 0.12 x10 3/uL      Basophils Absolute Automated 0.04 x10 3/uL      Immature Granulocytes Absolute 0.01 x10 3/uL      Absolute NRBC 0.00 x10 3/uL               Radiology Results     COVID Note  The patient was seen and evaluated during the time of COVID pandemic.  Significant limitations can be present in the evaluation and management of ED patients during pandemic conditions, including but not limited to lack of testing, rapidly changing IHS protocols, limited evaluation and follow-up resources.       I am the first provider for this patient and I personally performed the services documented.. This note accurately reflects work and decisions made by me.  ______________________________    Note: parts of this note were generated by the Epic EMR system/ Dragon speech recognition and may contain inherent errors or omissions not intended by the user. Grammatical errors, random word insertions, deletions, pronoun errors and incomplete sentences are occasional consequences of this technology due to software limitations. Not all errors are caught or corrected. If there are questions or concerns about the content of this note or information contained within the  body of this dictation they should be addressed directly with the author for clarification.         Alyson Reedy, MD  03/02/19 2149

## 2019-03-02 NOTE — EDIE (Signed)
COLLECTIVE?NOTIFICATION?03/02/2019 06:00?[name], [name]?MRN: [id number]    Criteria Met      5 ED Visits in 12 Months    Security and Safety  No recent Security Events currently on file    ED Care Guidelines  There are currently no ED Care Guidelines for this patient. Please check your facility's medical records system.        Prescription Monitoring Program  000??- Narcotic Use Score  000??- Sedative Use Score  000??- Stimulant Use Score  000??- Overdose Risk Score  - All Scores range from 000-999 with 75% of the population scoring < 200 and on 1% scoring above 650  - The last digit of the narcotic, sedative, and stimulant score indicates the number of active prescriptions of that type  - Higher Use scores correlate with increased prescribers, pharmacies, mg equiv, and overlapping prescriptions  - Higher Overdose Risk Scores correlate with increased risk of unintentional overdose death   Concerning or unexpectedly high scores should prompt a review of the PMP record; this does not constitute checking PMP for prescribing purposes.      E.D. Visit Count (12 mo.)  Facility Visits   [facility] 3   [facility] 1   [facility] [place] 2   [place] [place] [facility] 1   [facility] 5   [facility] 2   [facility] 1   [facility] 7   Total 22   Note: Visits indicate total known visits.     Recent Emergency Department Visit Summary  Showing 10 most recent visits out of 22 in the past 12 months  Date Facility [facility] Type Diagnoses or Chief Complaint   Mar 02, 2019 [place] [facility] [place] [facility] [place] [place] [facility]      anxiety      Feb 06, 2019 [place] [facility] [place] [facility] [place] [place] [facility]      Psychiatric Eval      Homeless      Homelessness      Encounter for issue of repeat prescription      Jan 26, 2019 [facility] [place] [facility]      Homeless      Psychiatric Evaluation      Effect of reduced temperature, unspecified, initial  encounter      Jan 23, 2019 [place] [facility] [place] [facility] [place] [place] [facility]      Psych evaluation      Psychiatric Evaluation      Unspecified psychosis not due to a substance or known physiological condition      Jan 16, 2019  [facility] [place] [facility] [place] [place] [facility]      psych problem      Psychiatric Evaluation      Other symptoms and signs involving appearance and behavior      Other symptoms and signs involving cognitive functions and awareness      Nov 06, 2018 [place] [facility] [name] [facility]      DEPRESSION      Psychiatric Evaluation      Schizophrenia, unspecified      Homelessness      Nov 05, 2018 [place] [facility] [name] [facility]      anxiety      Schizophrenia, unspecified      Nov 03, 2018 [place] [facility] [place] [facility]      Triage A      Anxiety  Anxiety disorder, unspecified      Oct 02, 2018 [facility] [place] [facility] [place] [place] [facility]      Fever      Generalized Body Aches      Myalgia, unspecified site      Sep 23, 2018 [facility] [place] [facility] [place] [facility]      Leg Pain      Cold Exposure      Pain in right leg      Bipolar disorder, unspecified      Reaction to severe stress, unspecified          Recent Inpatient Visit Summary  No recorded inpatient visits.     Care Team  Provider Specialty Phone Fax Service Dates   ASSIGNED, NO PCP, CNM Advanced Practice Midwife [phone] Current      Collective Portal  This patient has registered at the [facility] Emergency Department   For more information visit: [email or web address]     PLEASE NOTE:     1.   Any care recommendations and other clinical information are provided as guidelines or for historical purposes only, and providers should exercise their own clinical judgment when providing care.    2.   You may only use this information for purposes of treatment, payment or health care operations activities, and subject to the  limitations of applicable Collective Policies.    3.   You should consult directly with the organization that provided a care guideline or other clinical history with any questions about additional information or accuracy or completeness of information provided.    ? 2021 [place], [employer] - [email or web address]

## 2019-03-02 NOTE — ED Notes (Signed)
Security arrives with patient belongings for return and preparation to discharge.

## 2019-03-02 NOTE — Progress Notes (Signed)
Psychiatric Evaluation Part I    VASCO BUNKER is a 44 y.o. male admitted to the [facility] Emergency Department who was seen via Telepsych on 03/02/2019 by Burns Spain, RN.    Call Details  Patient Location: [facility] ED  Patient Room Number: S18  Time contacted by ED Physician: 0900  Time consult began: 0905  Time (in minutes) from Call to Consult: 5  Time consult concluded: 1022  Referring ED Department  Emergency Department: [facility]        Discharge Planning  Living Arrangements: Alone  Support Systems: Shelter  Type of Residence: Homeless  Patient expects to be discharged to:: [facility]      CSSR-S:  1) Have you wished you were dead or wished you could go to sleep and not wake up?-No  2) Have you actually had any thoughts of killing yourself? -No  If yes to 2, ask questions 3,4,5 and 6. If NO to 2, go directly to question 6.  3) Have you been thinking about how you might do this?-No  4) Have you had these thoughts and had some intention of acting on them?-No  5) Have you started to work out or worked out the details of how to kill yourself? Do you intend to carry out this plan?-No  6) Have you ever done anything, started to do anything, or prepared to do anything to end your life? If yes, was this within the past three months?-No             Within the Last 6 Months:: no history of violence toward self  Greater than 6 Months Ago:: no history of violence toward self              Substance Abuse History  Previous Substance Abuse Treatment?: No  Recovery and Support Involvement  Current Involvement: None  Past Involvement: None  Have you been prescribed medications to support recovery?: None  Support Systems: Shelter  Substance Recovery Support  Have you been prescribed medications to support recovery?: None  Past Withdrawal Symptoms  Past Withdrawal Symptoms: Anxiety, Irritability, Depression, Insomnia  History of Blackouts?: No  History of Withdrawal Seizures?: No                Violence Toward Others  Within  the Last 6 Months:: no history of violence toward others  Greater than 6 Months Ago:: no history of violence toward others     Preliminary Diagnosis (DSM IV)  Axis I: Bipolar Disorder  Axis II: Deferred  Axis III: None stated  Axis IV: Primary support group, Social environment, Housing, Access to health care services, Economic  Axis V on Admission: 41  Axis V - Highest in Past Year: 50        Presenting Problem: Patient stated he called the ambulance yesterday from his group home because he was "feeling sick".He complained of having generalized body pain and anxiety.He denies SI/HI and plans.He stated he has not been been taking his prescribed psychotropic medications for some time and would like a refill of his script.Patient denies AVH.    Current Major Stressors:Homelessness    Access to [employer]: Patient denies    Psychiatric History:     Inpatient treatment history: Multiple inpatient MH admissions by hx since 2018.      Outpatient treatment history (current & past):      Diagnoses (past): Bipolar Disorder     Prior Medication trials: Denies     Suicide Attempts/self- Injurious  behaviors: Patient denies     Hx of violence/aggression: Patient denies      Trauma History:Patient denies    Medical History (Include active  and chronic medical conditions): Patient does not remember    Substance Use History: Hx of MJ and Alcohol use    Home Medications (names, doses, frequency, psychiatric, non psychiatric, compliant/non compliant): Invega 6mg  po qd 'Depakote ER 500mg  bid;Cogentin 0.5mg  po bid;Haldol 5mg  po bid and Risperdal 3mg  po qhs.    Social History:     Current living situation: Homeless,lives in a shelter at [facility]      Supports: S/A     Employment status/occupation: Unemployed      Legal history: Patient Denies    Family History (psych dx, substance use, suicide attempts): Patient denies    Presenting Mental Status  Orientation Level: Oriented X4  Memory: No Impairment  Thought Content:  somatic  Thought Process: normal  Behavior: normal  Consciousness: Alert  Impulse Control: normal  Perception: normal  Eye Contact: poor  Attitude: blaming  Mood: depressed  Hopelessness Affects Goals: No  Hopelessness About Future: No  Affect: labile  Speech: slow  Concentration: impaired  Insight: fair  Judgment: fair  Appearance: normal  Appetite: normal  Weight change?: normal  Energy: normal  Sleep: difficulty falling asleep  Reliability of Reporter/Patient: fair    Summary: Patient was assessed using telepsych at the request of the ED physician.  Patient was alert and oriented x 4.His thought process seem congruent to the current situation.His remote memory seems impaired.His speech was slow and he barely opened his eyes while talking.Patient stated he called 911 yesterday because he was "getting sick" all over.That his anxiety level became increased and he felt very sick.Patient is well known to [facility] Behavior systems.He lives in a shelter and  Chronic hx of substance abuse and Bipolar Disorder.Patient has not been hospitalized recently.He has not been taking prescribed MH medications and have missed several referral both to [facility] and [facility].Patient denies SI/HI and plans.He also denies current AVH.He agreed to return to the shelter and follow up with [facility] for medication evaluation and stabilization.He denies problems with sleep and appetite.Most recent UDS were negative for all substances.Case was staffed with the ED physican who agreed to discharge patient to the [facility]-OP and back to his shelter after he is medically cleared.    Diagnosis: Bipolar Disorder 1    Disposition:Disposition  Disposition Outcome - Choose One: All Other  Other Disposition Sources: [facility] Mental Health    Justification for disposition: Patient denies SI/HI and plans.He stated he was having physical ailment before coming to the ED via EMT.He denies AVH and need for inpatient hospitalization.He requested for his medications be refilled.He  was referred to the [facility].    If patient is voluntarily admitted to an inpatient psychiatric unit and decides to leave AMA within the first 8 hours on the unit, is there an identified petitioner?-N/A    Name of Petitioner:N/A  Contact Information:N/A  If no petitioner is identified, please explain why not: N/A    Insurance Pre-authorization information:N/A    Was consent for voluntary admission obtain and scanned into EPIC? -N/A     By whom? -N/A      Burns Spain, RN    [facility]  [address]  [place], [place] [address]  [phone]

## 2019-03-02 NOTE — ED Notes (Signed)
1610 cart requested for psych eval

## 2019-03-05 DIAGNOSIS — R5383 Other fatigue: Secondary | ICD-10-CM | POA: Insufficient documentation

## 2019-03-05 DIAGNOSIS — Z590 Homelessness: Secondary | ICD-10-CM | POA: Insufficient documentation

## 2019-03-05 DIAGNOSIS — F17210 Nicotine dependence, cigarettes, uncomplicated: Secondary | ICD-10-CM | POA: Insufficient documentation

## 2019-03-05 NOTE — ED Triage Notes (Signed)
Pt arrives by EMS. [place] C/o CP, pain is non-radiating. 12-lead negative per EMS.

## 2019-03-05 NOTE — EDIE (Signed)
COLLECTIVE?NOTIFICATION?03/05/2019 23:34?[name], [name]?MRN: [id number]    Criteria Met      5 ED Visits in 12 Months    Security and Safety  No recent Security Events currently on file    ED Care Guidelines  There are currently no ED Care Guidelines for this patient. Please check your facility's medical records system.        Prescription Monitoring Program  000??- Narcotic Use Score  000??- Sedative Use Score  000??- Stimulant Use Score  000??- Overdose Risk Score  - All Scores range from 000-999 with 75% of the population scoring < 200 and on 1% scoring above 650  - The last digit of the narcotic, sedative, and stimulant score indicates the number of active prescriptions of that type  - Higher Use scores correlate with increased prescribers, pharmacies, mg equiv, and overlapping prescriptions  - Higher Overdose Risk Scores correlate with increased risk of unintentional overdose death   Concerning or unexpectedly high scores should prompt a review of the PMP record; this does not constitute checking PMP for prescribing purposes.      E.D. Visit Count (12 mo.)  Facility Visits   [facility] 3   [facility] 1   [facility] [place] 2   [place] [facility] 1   [place] [facility] 5   [facility] 2   [facility] 1   [place] [facility] 8   Total 23   Note: Visits indicate total known visits.     Recent Emergency Department Visit Summary  Showing 10 most recent visits out of 23 in the past 12 months  Date Facility [facility] Type Diagnoses or Chief Complaint   Mar 05, 2019 [place] [facility] [place] [place] [facility]      CP      Mar 02, 2019 [place] [facility] [place] [facility] [place] [place] [facility]      anxiety      Anxiety disorder, unspecified      Feb 06, 2019 [place] [facility] [place] [facility] [place] [place] [facility]      Psychiatric Eval      Homeless      Homelessness      Encounter for issue of repeat prescription      Jan 26, 2019 [facility] [place] [place] [facility]       Homeless      Psychiatric Evaluation      Effect of reduced temperature, unspecified, initial encounter      Jan 23, 2019 [place] [facility] [place] [facility] [place] [place] [facility]      Psych evaluation      Psychiatric Evaluation      Unspecified psychosis not due to a substance or known physiological condition      Jan 16, 2019 [place] [facility] [place] [facility] [place] [place] [facility]      psych problem      Psychiatric Evaluation      Other symptoms and signs involving appearance and behavior      Other symptoms and signs involving cognitive functions and awareness      Nov 06, 2018 [place] [facility] [place] [facility]      DEPRESSION      Psychiatric Evaluation      Schizophrenia, unspecified      Homelessness      Nov 05, 2018 [place] [facility] [place] [facility]      anxiety      Schizophrenia, unspecified  Nov 03, 2018 [place] [facility] [place] [facility] [place] [place] [facility]      Triage A      Anxiety      Anxiety disorder, unspecified      Oct 02, 2018 [facility] [place] [facility] [place] [facility]      Fever      Generalized Body Aches      Myalgia, unspecified site          Recent Inpatient Visit Summary  No recorded inpatient visits.     Care Team  Provider Specialty Phone Fax Service Dates   ASSIGNED, NO PCP, CNM Advanced Practice Midwife [phone] Current      Collective Portal  This patient has registered at the [facility] Emergency Department   For more information visit: [email or web address]     PLEASE NOTE:     1.   Any care recommendations and other clinical information are provided as guidelines or for historical purposes only, and providers should exercise their own clinical judgment when providing care.    2.   You may only use this information for purposes of treatment, payment or health care operations activities, and subject to the limitations of applicable Collective Policies.    3.   You should consult directly with the  organization that provided a care guideline or other clinical history with any questions about additional information or accuracy or completeness of information provided.    ? 2021 [place], [employer]. - [email or web address]

## 2019-03-06 ENCOUNTER — Emergency Department
Admission: EM | Admit: 2019-03-06 | Discharge: 2019-03-06 | Disposition: A | Discharge status: Home / Self Care | Payer: Medicaid HMO | Attending: Emergency Medicine | Admitting: Emergency Medicine

## 2019-03-06 DIAGNOSIS — Z5900 Homelessness unspecified: Secondary | ICD-10-CM

## 2019-03-06 DIAGNOSIS — T730XXA Starvation, initial encounter: Secondary | ICD-10-CM

## 2019-03-06 LAB — ECG 12-LEAD
Atrial Rate: 97 {beats}/min
P Axis: 67 degrees
P-R Interval: 162 ms
Q-T Interval: 344 ms
QRS Duration: 90 ms
QTC Calculation (Bezet): 436 ms
R Axis: 84 degrees
T Axis: 43 degrees
Ventricular Rate: 97 {beats}/min

## 2019-03-06 NOTE — Discharge Instructions (Signed)
Dear [name]:    Thank you for choosing the [facility] Emergency Department for your healthcare needs.     Specific instructions for your visit today:      [facility]  [phone]  [address]  [place], [place] [address]  Fax: [phone]    [facility]  [phone]  [address]  [place], [place] [address]  Fax: [phone]    [facility] @ [facility]  [phone]  [address]  [place], [place] [address]  (Family Medicine, Women's Health)  Fax: [phone]    [facility]  [phone]  [address]  [place], [place] [address]  (Adults only)  Fax: [phone]    Neighborhood Health  [phone]  [address] [place], [place] [address]--[address] [facility] & Internal Medicine, Pediatrics, [facility])  [address], [place], [place] [address]--[facility] & Internal Medicine, Pediatrics, Women's Health)  Fax: [phone]    [facility] [place], [place], [place], [facility]  [phone]  [address]  [place], [place] [address]  (Adults only)  Fax: [phone]    [facility]  [phone]  [address]   [place], [place] [address]  [facility] & Internal Medicine, Pediatrics, [facility] Health)  Fax: [phone]    [facility] @ [facility]  [phone]  [address]  [place], [place] [address]  (Family Medicine--Adults only, Women's Health)  Fax: [phone]    [facility] @ [facility]  [phone]  [address]  [place], [place] [address]  (Family Medicine--Adults only, [facility] Health)  Fax: [phone]    [facility] @ [place]  [phone]  [address] [place], [place]  [address]  (Pediatrics)  Fax: [phone]    [facility]  [phone]  First [address] of [place]  [address]  [place], [place] [address]  Fax: [phone]    Rico Junker, Vicente Serene    [facility] [place]  [phone]   [address]  [place], [place] [address]  Fax: [phone]    [facility] [place]  [phone]  [address] Third Floor  [place], [place] [address]  Fax: [phone]    [facility]   [phone]  [address] [place], [place] [address]  Fax: [phone]    [facility]  [phone]  [address]  [place], [place] [address]  (Adults and pediatrics)  Fax: [phone]    [facility] for [employer]  [phone]  [address] [place], [place] [address]  Fax: [phone]    [facility]  [phone]  [address] [place], [place] [address]  Fax: [phone]    [facility]  [phone]  [address]  [place], [place] [address]  (Adults and pediatrics)  Fax: [phone]    [facility]  [phone]  [address]  [place], [place] [address]  Fax: [phone]    [facility]  [phone]  [phone] [address] [facility] [address]  [place], [place] [address]  Fax: [phone]    [facility]er [address] [facility]  [phone]  [address]  [place], [place] [address]  Fax: [phone]    [facility])  [phone]  [address] [place], [place] [address]  [address], [place], [place] [address]  Fax: [phone]    Health Departments    [facility]  [address] [place], [place] [address]  [phone]  www.alexhealth.[facility]  Department  [address]  [place], [place] [address]  [phone]  [email or web address].[email or web address].[email or web address]/public-health/    [facility]  [address]  [place], [place] [address]  [phone]  https://[email or web address]    [facility] Department--Maternity Services  Family Resources Referral [phone]  https://www.[email or web address].co.za    [facility] Department  [address] [place], 2nd Floor  [place], [place] [address]  [phone]  BuildHer.com.cy    [facility] Department  [address]   [place], [place] [address]  [phone]  BrowserReview.ca    Other Resources    [facility] for Healthier Communities  Connects uninsured children and adults to appropriate and affordable healthcare services  [phone]  PHC@[place] .org  PhoneCaptions.ch    [facility]  HIV/AIDS Care  [address]  [place], [place]   [phone]    [facility]  [address]  [place], [place] [address]  [phone]     [facility]  [phone]  PainBracelets.dk    [facility]  [address]  [place], [place] [address]  [phone]    [facility]  [phone]  https://www.needymeds.org/    Medicine Assistance Tool  MaleSalons.uy    http://www.long-jenkins.com/  RuleEnforcement.cz    [employer] of Insurance        http://www.henderson-fletcher.com/    Medical Care for [employer] [facility])  [address]  [place], [place] [address]  [phone]    [facility] (must be referred through certain  agencies)  [place]--[phone]  [place]--[phone]  https://www.nvds.org/northern-[place]-dental-clinic    [facility]  [phone] or [phone]  http://healthsciences.LawyerNetworking.com.cy    [facility] Residents Only)  [phone]  https://health.[email or web address].us/public-health/health-clinics-services/dental-clinic/    Outside [address]  [place] [employer]  [phone] Hour Nurse Advice [phone]    [facility] [place].  [phone]    [facility]  [facility]  [phone]    [facility] [place] [facility] [place]  [phone]    [facility] [place] - Patient Services   [phone]    [facility] - Patient Relations  [phone]        BEFORE LEAVING THE EMERGENCY DEPARTMENT, PLEASE CHECK WITH REGISTRATION TO MAKE SURE WE HAVE AN UP-TO-DATE CONTACT TELEPHONE NUMBER FOR YOU.     Certain laboratory test results do not come back the same day.   Radiology films are often reviewed again to ensure accuracy.  We will contact you by telephone if any of these laboratory tests or radiology studies are abnormal.        IF YOU DO NOT CONTINUE TO IMPROVE OR IF YOUR CONDITION WORSENS, PLEASE CONTACT YOUR PRIMARY DOCTOR OR RETURN IMMEDIATELY TO THE EMERGENCY DEPARTMENT FOR A RE-EVALUATION.  IF YOU DO NOT HAVE A PRIMARY DOCTOR TO FOLLOW-UP WITH PLEASE CONTACT THE [place] [facility] TO FIND ONE:    [facility]  Telephone:  [phone]  Website: Inovamedicalgroup.org

## 2019-03-06 NOTE — ED Notes (Signed)
Patient discharged, requesting food. Pt was told that we are out of sandwiches and was provided with crackers, juice, and cereal with milk.

## 2019-03-06 NOTE — ED Triage Notes (Signed)
Patient aaox4 to ED for eval of chest pain. [place]sudden onset" of chest pain. At this time patient laying in hospital stretcher, with eyes closed. Respirations symmetric and unlabored, in no acute distress.

## 2019-03-06 NOTE — Progress Notes (Signed)
SW received call from bedside RN. Patient requesting assistance to return hypothermia shelter [address] [place], [place]. Patient likely has transportation benefits through [facility], however, due to COVID19 pandemic provided SCM cab voucher to expedite discharge.      Debby Bud, MSW  Emergency Department  Social Work Case Manager I   [employer]  [phone]

## 2019-03-06 NOTE — ED Provider Notes (Addendum)
[facility] EMERGENCY DEPARTMENT ATTENDING PHYSICIAN NOTE     CLINICAL SUMMARY          Diagnosis:    .     Final diagnoses:   Homelessness   Hungry, initial encounter         Disposition:       ED Disposition     ED Disposition Condition Date/Time Comment    Discharge  Wed Mar 06, 2019  2:58 AM Laurence Slate discharge to home/self care.    Condition at disposition: Stable             Discharge Prescriptions     None                                            CLINICAL INFORMATION        HPI:         44 y.o. male p/w fatigue. Gradual onset.  Moderate intensity. No alleviating factors.  Ongoing for about a week.    He states he is homeless and cold and just came to the ER to rest.    No falls or trauma  No SI  Denies any chest pain (contrary to triage note)    History obtained from:  Patient           ROS:      Pertinent Positives and Negatives noted in the HPI.    All other systems reviewed and are negative        Physical Exam:      Pulse 93   BP 119/72   Resp 17   SpO2 98 %   Temp 98.9 F (37.2 C)    Nursing note and vitals reviewed.    Constitutional:  Comfortable. Full sentences.   Psych:  Normal affect. Cooperative.   Eyes: No conjunctival injection. No discharge.  Ears, Nose, Mouth and Throat:Tolerating secretions.  No hoarseness.  Respiratory: No respiratory distress. No retractions.    Cardiovascular: No murmurs. Regular rhythm.   Abdomen: Soft. Non-tender    Musculoskeletal:           Neck: supple          Back:            Upper Extremity:           Lower Extremity:   Neurological: awake. Alert. Oriented. Clear speech. Nl concentration  Integumentary:   GU:   Lymphatic:                PAST HISTORY        Primary Care Provider: Valetta Fuller, MD        PMH/PSH:      Laurence Slate has a past medical history of Manic bipolar I disorder and Schizophrenia.  He has no past surgical history on file.      Social/Family History:      He reports that he has been smoking. He has been smoking about  1.00 pack per day. He has never used smokeless tobacco. He reports previous alcohol use. He reports previous drug use.  His family history is not on file.      Listed Medications on Arrival:    .     Home Medications     Med List Status: In Progress Set By: Berenda Morale, RN at 03/06/2019  3:00 AM  No Medications        Allergies: He has No Known Allergies.            VISIT INFORMATION        Clinical Course & Medical Decision Making:      44 y.o. male p/w feeling cold and hungry.  His temp is normal. His hunger resolved after some food here in the ER.  No SI. D/c.       Medications Given in the ED:    .     ED Medication Orders (From admission, onward)    None            Procedures:      Procedures      Interpretations:        EKG interpretation:   Reviewed and interpreted by Dr. Delorse Lek at 2:50 AM  Rate: Normal  Rhythm:Sinus  Axis: Normal  PR Interval:  Normal  QRS Interval: Normal  QT Interval:  Normal  T Waves:  No inversions. No flattening.  ST Segments: No elevations or depressions.  Q Waves: None    Overall Impression:  Sinus, non-ischemic      CARDIAC MONITOR INTERPRETATION:  Cardiac monitor reviewed by me at 2:50 AM   Sinus, 90s    OXYGEN SATURATION INTERPRETATION:  Oxygen saturation reviewed by me at 2:50 AM   98% on room air, no intervention needed            RESULTS        Lab Results:      Results     ** No results found for the last 24 hours. **              Radiology Results:      No orders to display               Scribe Attestation:      No scribe was involved in the care of this patient.          Delorse Lek, MD  03/06/19 [phone]

## 2019-10-18 ENCOUNTER — Emergency Department
Admission: EM | Admit: 2019-10-18 | Discharge: 2019-10-18 | Disposition: A | Discharge status: Home / Self Care | Payer: Medicaid HMO | Attending: Emergency Medicine | Admitting: Emergency Medicine

## 2019-10-18 DIAGNOSIS — F17210 Nicotine dependence, cigarettes, uncomplicated: Secondary | ICD-10-CM | POA: Insufficient documentation

## 2019-10-18 DIAGNOSIS — R4182 Altered mental status, unspecified: Secondary | ICD-10-CM | POA: Insufficient documentation

## 2019-10-18 DIAGNOSIS — F209 Schizophrenia, unspecified: Secondary | ICD-10-CM | POA: Insufficient documentation

## 2019-10-18 DIAGNOSIS — Z5900 Homelessness unspecified: Secondary | ICD-10-CM | POA: Insufficient documentation

## 2019-10-18 LAB — GLUCOSE WHOLE BLOOD - POCT: Whole Blood Glucose POCT: 93 mg/dL (ref 70–100)

## 2019-10-18 MED ORDER — THIAMINE (VITAMIN B1) 100 MG PO TABS (WRAP)
100.0000 mg | ORAL_TABLET | Freq: Once | ORAL | Status: DC
Start: 2019-10-18 — End: 2019-10-18
  Filled 2019-10-18: qty 1

## 2019-10-18 MED ORDER — FOLIC ACID 1 MG PO TABS
1.0000 mg | ORAL_TABLET | Freq: Once | ORAL | Status: DC
Start: 2019-10-18 — End: 2019-10-18
  Filled 2019-10-18: qty 1

## 2019-10-18 NOTE — ED Notes (Signed)
Pt refused glucose check and lab draw. Dr. Arlyss Repress notified and aware.

## 2019-10-18 NOTE — ED Triage Notes (Signed)
Pt BIBA and was seen lying against wall holding beer. +EtOH. Pt is observed laying in stretcher on side.

## 2019-10-18 NOTE — EDIE (Signed)
COLLECTIVE?NOTIFICATION?10/18/2019 16:10?MURRAYHaider, Hornaday B?MRN: [id number]    Criteria Met      5 ED Visits in 12 Months    Security and Safety  No recent Security Events currently on file    ED Care Guidelines  There are currently no ED Care Guidelines for this patient. Please check your facility's medical records system.        Prescription Monitoring Program  000??- Narcotic Use Score  000??- Sedative Use Score  000??- Stimulant Use Score  000??- Overdose Risk Score  - All Scores range from 000-999 with 75% of the population scoring < 200 and on 1% scoring above 650  - The last digit of the narcotic, sedative, and stimulant score indicates the number of active prescriptions of that type  - Higher Use scores correlate with increased prescribers, pharmacies, mg equiv, and overlapping prescriptions  - Higher Overdose Risk Scores correlate with increased risk of unintentional overdose death   Concerning or unexpectedly high scores should prompt a review of the PMP record; this does not constitute checking PMP for prescribing purposes.      E.D. Visit Count (12 mo.)  Facility Visits   [facility] [place] 2   [place] [facility] 1   [facility] 7   Total 10   Note: Visits indicate total known visits.     Recent Emergency Department Visit Summary  Date Facility [facility] Type Diagnoses or Chief Complaint   Oct 18, 2019 [place] [place] [facility]      +etoh      Mar 06, 2019 [place] [facility] [place] [place] [facility]      CP      Chest Pain      Homelessness      Starvation, initial encounter      Mar 02, 2019 [place] [facility] [place] [facility] [place] [place] [facility]      anxiety      Anxiety disorder, unspecified      Feb 06, 2019 [place] [facility] [place] [facility] [place] [place] [facility]      Psychiatric Eval      Homeless      Homelessness      Encounter for issue of repeat prescription      Jan 26, 2019 [place] [facility] [place] [place] [facility]      Homeless      Psychiatric Evaluation      Effect of reduced  temperature, unspecified, initial encounter      Jan 23, 2019 [place] [facility] [place] [facility] [place] [place] [facility]      Psych evaluation      Psychiatric Evaluation      Unspecified psychosis not due to a substance or known physiological condition      Jan 16, 2019 [place] [facility] [place] [facility] [place] [place] [facility]      psych problem      Psychiatric Evaluation      Other symptoms and signs involving appearance and behavior      Other symptoms and signs involving cognitive functions and awareness      Nov 06, 2018 [place] [facility] [place] [place] [facility]      DEPRESSION      Psychiatric Evaluation      Schizophrenia, unspecified      Homelessness      Nov 05, 2018 [place] [facility] [place] [place] [facility]      anxiety      Schizophrenia, unspecified  Nov 03, 2018 [place] [facility] [place] [facility] [place] [place] [facility]      Triage A      Anxiety      Anxiety disorder, unspecified          Recent Inpatient Visit Summary  No recorded inpatient visits.     Care Team  There is not a care team on record at this time.   Collective Portal  This patient has registered at the [facility] Emergency Department   For more information visit: [email or web address]     PLEASE NOTE:     1.   Any care recommendations and other clinical information are provided as guidelines or for historical purposes only, and providers should exercise their own clinical judgment when providing care.    2.   You may only use this information for purposes of treatment, payment or health care operations activities, and subject to the limitations of applicable Collective Policies.    3.   You should consult directly with the organization that provided a care guideline or other clinical history with any questions about additional information or accuracy or completeness of information provided.    ? 2021 [place], [employer]. - PrizeAndShine.co.uk

## 2019-10-18 NOTE — ED Provider Notes (Signed)
[facility] EMERGENCY DEPARTMENT H&P      Visit date: 10/18/2019      CLINICAL SUMMARY          Diagnosis:    .     Altered mental status  Schizophrenia      MDM Notes:      Altered behavior - ? Intoxication . History of schizophrenia as well.  No labs since 2/12 and very poor historian - will check labs, observe.        Disposition:      Discharge               CLINICAL INFORMATION        HPI:      Chief Complaint: Alcohol Problem  .    [name] is a 44 y.o. male w/ pmhx of manic BP1 and schizophrenia presents with BIBA found laying against a wall holding a beer and intoxicated. Pt states he has BL leg pain. Pt in the ED slurring words and saying he is tired.   Pt is homeless.       History obtained from: review of prior chart  Caveat: HPI caveat invoked due to patient's mental status.          ROS:      Caveat: Unable to complete ROS due to patient mental status      Physical Exam:      Pulse 92   BP 129/59   Resp 16   SpO2 99 %   Temp 97.6 F (36.4 C)    Physical Exam  Vitals and nursing note reviewed.   Constitutional:       Appearance: He is well-developed.      Comments: Alert, fatigued appearing   ? intoxicated   HENT:      Head: Normocephalic and atraumatic.   Eyes:      Pupils: Pupils are equal, round, and reactive to light.   Neck:      Thyroid: No thyromegaly.      Vascular: No JVD.      Trachea: No tracheal deviation.   Cardiovascular:      Rate and Rhythm: Normal rate and regular rhythm.      Heart sounds: No murmur heard.   No friction rub. No gallop.    Pulmonary:      Effort: Pulmonary effort is normal. No respiratory distress.      Breath sounds: Normal breath sounds. No stridor. No wheezing or rales.      Comments: Clear bilat.  Chest:      Chest wall: No tenderness.   Abdominal:      General: Bowel sounds are normal. There is no distension.      Palpations: Abdomen is soft. There is no mass.      Tenderness: There is no abdominal tenderness. There is no guarding or rebound.       Comments: No tenderness.   Musculoskeletal:         General: No tenderness. Normal range of motion.      Cervical back: Normal range of motion and neck supple.   Lymphadenopathy:      Cervical: No cervical adenopathy.   Skin:     General: Skin is warm.      Coloration: Skin is not pale.      Findings: No erythema or rash.   Neurological:      Mental Status: He is alert.      Cranial Nerves: No cranial nerve deficit.  Motor: No abnormal muscle tone.      Coordination: Coordination normal.   Psychiatric:      Comments: Flat affect  Poor judgment , insight.  No Si or HI voiced.                      PAST HISTORY        Primary Care Provider: Valetta Fuller, MD        PMH/PSH:    .     Past Medical History:   Diagnosis Date    Manic bipolar I disorder     Schizophrenia        He has no past surgical history on file.      Social/Family History:      He reports that he has been smoking. He has been smoking about 1.00 pack per day. He has never used smokeless tobacco. He reports previous alcohol use. He reports previous drug use.    No family history on file.      Listed Medications on Arrival:    .     Home Medications     None on File         Allergies: He has No Known Allergies.            VISIT INFORMATION        Clinical Course in the ED:       8:15 PM  Alert, calm - wants to leave - refused blood work.  Declines offer of shelter list.  Suspect chronic schizophrenia - may be some alcohol ingestion today.      Medications Given in the ED:    .     ED Medication Orders (From admission, onward)    None            Procedures:      Procedures      Interpretations:      O2 sat-           saturation: 99 %; Oxygen use: room air; Interpretation: Normal                   RESULTS        Lab Results:      Results     Procedure Component Value Units Date/Time    Glucose Whole Blood - POCT [[id number]] Collected: 10/18/19 1638     Updated: 10/18/19 1640     Whole Blood Glucose POCT 93 mg/dL               Radiology  Results:      No orders to display               Scribe Attestation:      I was acting as a Neurosurgeon for Maurine Minister, MD on [facility] B[facility]  Treatment Team: Scribe: Joanne Gavel     I am the first provider for this patient and I personally performed the services documented. Treatment Team: Scribe: Joanne Gavel is scribing for me on [place] BERNARD. This note and the patient instructions accurately reflect work and decisions made by me.  Maurine Minister, MD          Maurine Minister, MD  10/21/19 [phone]

## 2019-10-18 NOTE — ED Notes (Signed)
Pt refused Vital signs and pushed RN's shoulder.

## 2019-10-18 NOTE — Discharge Instructions (Signed)
Dear Mr. Nham:    Thank you for choosing the [facility] Emergency Department, the premier emergency department in the [place] area.  I hope your visit today was EXCELLENT. You will receive a survey via text message that will give you the opportunity to provide feedback to your team about your visit. Please do not hesitate to reach out with any questions!    Specific instructions for your visit today:        IF YOU DO NOT CONTINUE TO IMPROVE OR YOUR CONDITION WORSENS, PLEASE CONTACT YOUR DOCTOR OR RETURN IMMEDIATELY TO THE EMERGENCY DEPARTMENT.    Sincerely,  Maurine Minister, MD  Attending Emergency Physician  [facility] Emergency Department    ONSITE PHARMACY  Our full service onsite pharmacy is located in the ER waiting room.  Open 7 days a week from 9 am to 9 pm.  We accept all major insurances and prices are competitive with major retailers.  Ask your provider to print your prescriptions down to the pharmacy to speed you on your way home.    OBTAINING A PRIMARY CARE APPOINTMENT    Primary care physicians (PCPs, also known as primary care doctors) are either internists or family medicine doctors. Both types of PCPs focus on health promotion, disease prevention, patient education and counseling, and treatment of acute and chronic medical conditions.    If you need a primary care doctor, please call the below number and ask who is receiving new patients.     [facility]  Telephone:  [phone]  [email or web address]    DOCTOR REFERRALS  Call [phone] (available 24 hours a day, 7 days a week) if you need any further referrals and we can help you find a primary care doctor or specialist.  Also, available online at:  [email or web address]    YOUR CONTACT INFORMATION  Before leaving please check with registration to make sure we have an up-to-date contact number.  You can call registration at [phone] to update your information.  For questions about your  hospital bill, please call [phone].  For questions about your Emergency Dept Physician bill please call [phone].      FREE HEALTH SERVICES  If you need help with health or social services, please call 2-1-1 for a free referral to resources in your area.  2-1-1 is a free service connecting people with information on health insurance, free clinics, pregnancy, mental health, dental care, food assistance, housing, and substance abuse counseling.  Also, available online at:  http://www.[place].org    MEDICAL RECORDS AND TESTS  Certain laboratory test results do not come back the same day, for example urine cultures.   We will contact you if other important findings are noted.  Radiology films are often reviewed again to ensure accuracy.  If there is any discrepancy, we will notify you.      Please call [phone] to pick up a complimentary CD of any radiology studies performed.  If you or your doctor would like to request a copy of your medical records, please call [phone].      ORTHOPEDIC INJURY   Please know that significant injuries can exist even when an initial x-ray is read as normal or negative.  This can occur because some fractures (broken bones) are not initially visible on x-rays.  For this reason, close outpatient follow-up with your primary care doctor or bone specialist (orthopedist) is required.    MEDICATIONS AND FOLLOWUP  Please be aware that some prescription medications can cause  drowsiness.  Use caution when driving or operating machinery.    The examination and treatment you have received in our Emergency Department is provided on an emergency basis, and is not intended to be a substitute for your primary care physician.  It is important that your doctor checks you again and that you report any new or remaining problems at that time.      [place]  The nearest 24 hour pharmacy is:    [facility] [place], [place]  [address]  [address] [address]  [facility])  Call to start or finish an application, compare plans, enroll or ask a question.  [place]: [phone]  Web:  Healthcare.gov    Help Enrolling in [place]  8100202390 (TOLL-FREE)  [phone] (TTY)  Web:  Http://www.coverva.org    Local Help Enrolling in the [place]  628-220-1664 (MAIN)  Email:  health-help_0 .org  Web:  http://lewis-perez.info/  Address:  [address] [place], [place] [address]    SEDATING MEDICATIONS  Sedating medications include strong pain medications (e.g. narcotics), muscle relaxers, benzodiazepines (used for anxiety and as muscle relaxers), Benadryl/diphenhydramine and other antihistamines for allergic reactions/itching, and other medications.  If you are unsure if you have received a sedating medication, please ask your physician or nurse.  If you received a sedating medication: DO NOT drive a car. DO NOT operate machinery. DO NOT perform jobs where you need to be alert.  DO NOT drink alcoholic beverages while taking this medicine.     If you get dizzy, sit or lie down at the first signs. Be careful going up and down stairs.  Be extra careful to prevent falls.     Never give this medicine to others.     Keep this medicine out of reach of children.     Do not take or save old medicines. Throw them away when outdated.     Keep all medicines in a cool, dry place. DO NOT keep them in your bathroom medicine cabinet or in a cabinet above the stove.    MEDICATION REFILLS  Please be aware that we cannot refill any prescriptions through the ER. If you need further treatment from what is provided at your ER visit, please follow up with your primary care doctor or your pain management specialist.    [place]  Did you know [facility] has two freestanding ERs located just a few miles away?  [facility] [place] [facility] [place] have short wait times, easy free parking directly in front of the building and top patient satisfaction scores - and the same Board Certified Emergency Medicine doctors as [facility].

## 2021-11-02 NOTE — ED Notes (Signed)
Formatting of this note might be different from the original.  Pain assessment on discharge was 0.  Condition stable.  Patient discharged to home.  Patient education was completed:  yes  Education taught to:  patient  Teaching method used was discussion and handout.  Understanding of teaching was good.  Patient was discharged ambulatory.  Discharged with family.  Valuables were given to: patient.   Electronically signed by Ginnie Smart, RN at 11/02/2021 11:51 PM EDT

## 2021-11-02 NOTE — ED Triage Notes (Signed)
Formatting of this note might be different from the original.  C/O ABD PAIN ON AND FOR THE PAST 3 MONTHS   DENIES ANY NAUSEA NOR DIARRHEA   Electronically signed by Elisabeth Most, RN at 11/02/2021 10:30 PM EDT

## 2021-11-02 NOTE — ED Triage Notes (Signed)
Formatting of this note might be different from the original.  C/O ABD PAIN ON AND FOR THE PAST 3 MONTHS   DENIES ANY NAUSEA NOR DIARRHEA   Electronically signed by Daleen Bo, RN at 11/02/2021 10:30 PM EDT

## 2021-11-02 NOTE — ED Provider Notes (Signed)
Formatting of this note is different from the original.    [facility] EMERGENCY DEPARTMENT    Time of Arrival:   11/02/21 2225    Final diagnoses:   [R10.9] Abdominal pain, unspecified abdominal location (Primary)   [Z76.5] Malingering     Medical Decision Making:      Differential Diagnosis:       Suspect malingering, multiple ER visits. Could be MSK pain, gastritis, gastroenteritis.    Do not think UTI, obstruction, ileus, diverticulitis/osis, pyelo, AAA, colitis, appy, hernia, mesenteric ischemia, pancreatitis, cholecystitis, hepatitis, peritonitis, anginal equaivalent based off h&p. Abdominal exam is without peritoneal signs, therefore do not think this patient has surgical abdomen.    Social Determinants of Health:  homeless and untreated mental illness. No PCP assigned in epic. Provided contact information for PCP vs. specialist f/u in discharge instructions to ensure access to further health care. It is recommended/instructed to patient to have further evaluation of today?s visit and/or additional testing/outpatient therapy.                       Glasgow Coma Scale Score: 15          Supplemental Historians include:  patient    ED Course:     Abdominal pain x 2 month    Multiple ER visits, 11 over the past 2 weeks including today, suspect malingering vs related to untreated mental illness.    History of schizophrenia, states he is not taking medication. Denies SI/HI/hallucinations    Patient in no acute distress, vital signs stable, exam reassuring.    Soft, nontender abdomen.  No guarding or rigidity.    Recommend checking labs to rule out emergent etiology, patient declines.    Patient made aware that emergent etiology of abdominal pain cannot be ruled out without labs and/or imaging.  Patient adamantly declines.  He states he only wants medication for his symptoms.  He is made aware of risks of not obtaining labs or additional w/u and still declines.  GI cocktail and Pepcid  given.    We will send prescription for symptom management with follow-up instructions provided.    Discharge with outpatient follow-up seems reasonable at this time.  The results and plan of care were discussed, all questions sought and answered.  At time of d/c, pt appears well hydrated, and is in no acute distress, no apparent emergent issue at this time.  Pt will f/u with PCP vs GI and return as needed with any new or worsening symptoms or other concerns (given strict return precautions and advised to return for any new or worsening Sx). The patient is stable for discharge at this time.         Documentation/Prior Results Review:  Old medical records, Nursing notes, Care Everywhere record    Rhythm interpretation from monitor: N/A    Imaging Interpreted by me: Not Applicable    No orders to display     .     Discussion of Mangement with other Physicians, QHP or Appropriate Source:   case and management discussed with co-signing attending      .    Disposition:  Home    Discharge Medication List as of 11/02/2021 11:37 PM       START taking these medications    Details   dicyclomine (BENTYL) 20 mg PO TABS Take 1 Tab by Mouth 4 Times Daily As Needed (abdominal pain)., Disp-30 Tab, R-0, Normal  Chief Complaint   Patient presents with    ABDOMINAL PAIN     HPI     46 year old male complaining of upper mid abdominal pain intermittently x2 months.  States he did not take anything for symptoms.  States he is unsure what he was doing prior to symptom onset.  He states he is unsure what makes it better or worse.  Denies radiation.  Denies new foods in diet, sick contacts, new medication changes.  Denies history of known GI disorders.  Past medical history of schizophrenia.  He denies SI, HI, hallucinations.  Denies current fever, chest pain, shortness of breath, nausea, vomiting, stool changes, or any other symptoms or complaints at this time.    Review of Systems:  Constitutional:  Negative for fever.   HENT:   Negative for congestion.    Eyes:  Negative for discharge.   Respiratory:  Negative for shortness of breath.    Cardiovascular:  Negative for chest pain.   Gastrointestinal:  Positive for abdominal pain. Negative for nausea, vomiting, diarrhea, constipation and blood in stool.   Genitourinary:  Negative for dysuria and hematuria.   Skin:  Negative for color change, rash and wound.   Neurological:  Negative for syncope.   Hematological:  does not bruise/bleed easily.   Psychiatric/Behavioral:  Negative for substance abuse.      Physical Exam  Vitals reviewed.   Constitutional:       General: He is not in acute distress.     Appearance: He is well-developed. He is not ill-appearing or toxic-appearing.   HENT:      Head: Normocephalic and atraumatic.      Mouth/Throat:      Mouth: Mucous membranes are moist.      Pharynx: Oropharynx is clear.   Eyes:      Conjunctiva/sclera: Conjunctivae normal.   Cardiovascular:      Rate and Rhythm: Normal rate.   Pulmonary:      Effort: No respiratory distress.      Breath sounds: Normal breath sounds.   Abdominal:      General: There is no distension.      Palpations: Abdomen is soft. There is no mass.      Tenderness: There is no abdominal tenderness. There is no right CVA tenderness, left CVA tenderness, guarding or rebound.      Hernia: No hernia is present.   Musculoskeletal:      Cervical back: Neck supple.   Skin:     General: Skin is warm and dry.      Findings: No erythema or rash.   Neurological:      Mental Status: He is alert.   Psychiatric:         Mood and Affect: Mood normal.     Past Medical History:   Diagnosis Date    Schizophrenia, unspecified ([place])      Past Surgical History:   Procedure Laterality Date    NO SURGICAL PROCEDURES PERFORMED       Family History   Family history unknown: Yes     Social History     Occupational History    Not on file   Tobacco Use    Smoking status: Every Day    Smokeless tobacco: Never   Substance and Sexual Activity    Alcohol use:  Yes    Drug use: Not Currently    Sexual activity: Not on file     Outpatient Medications Marked as Taking for the 11/02/21  encounter [facility] Encounter)   Medication Sig Dispense Refill    dicyclomine (BENTYL) 20 mg PO TABS Take 1 Tab by Mouth 4 Times Daily As Needed (abdominal pain). 30 Tab 0     No Known Allergies    Vital Signs:  Patient Vitals for the past 72 hrs:   Temp Heart Rate Resp BP BP Mean SpO2 Weight   11/02/21 2248 -- -- -- -- -- -- 97.5 kg (215 lb)   11/02/21 2247 97.4 F (36.3 C) 88 16 124/78 93 MM HG 98 % --   11/02/21 2230 -- -- -- -- -- -- 97.5 kg (215 lb)     Diagnostics:  Labs:  No results found for this visit on 11/02/21.  ECG:  No results found for this visit on 11/02/21.    Medications ordered/given in the ED  Medications   Alum-Mag Hydroxide-Simeth DS (Mylanta DS) 400-400-40 mg/5 mL suspension 15 mL (15 mL Oral Given 11/02/21 2323)   famotidine (Pepcid) tablet 20 mg (20 mg Oral Given 11/02/21 2323)       Electronically signed by Delmer Islam, MD at 11/03/2021 10:14 PM EDT

## 2021-11-02 NOTE — ED Notes (Signed)
Formatting of this note might be different from the original.  Pain assessment on discharge was 0.  Condition stable.  Patient discharged to home.  Patient education was completed:  yes  Education taught to:  patient  Teaching method used was discussion and handout.  Understanding of teaching was good.  Patient was discharged ambulatory.  Discharged with family.  Valuables were given to: patient.   Electronically signed by Celine Ahr, RN at 11/02/2021 11:51 PM EDT

## 2021-11-02 NOTE — ED Provider Notes (Signed)
Formatting of this note is different from the original.    [facility] EMERGENCY DEPARTMENT    Time of Arrival:   11/02/21 2225    Final diagnoses:   [R10.9] Abdominal pain, unspecified abdominal location (Primary)   [Z76.5] Malingering     Medical Decision Making:      Differential Diagnosis:       Suspect malingering, multiple ER visits. Could be MSK pain, gastritis, gastroenteritis.    Do not think UTI, obstruction, ileus, diverticulitis/osis, pyelo, AAA, colitis, appy, hernia, mesenteric ischemia, pancreatitis, cholecystitis, hepatitis, peritonitis, anginal equaivalent based off h&p. Abdominal exam is without peritoneal signs, therefore do not think this patient has surgical abdomen.    Social Determinants of Health:  homeless and untreated mental illness. No PCP assigned in epic. Provided contact information for PCP vs. specialist f/u in discharge instructions to ensure access to further health care. It is recommended/instructed to patient to have further evaluation of today?s visit and/or additional testing/outpatient therapy.                       Glasgow Coma Scale Score: 15          Supplemental Historians include:  patient    ED Course:     Abdominal pain x 2 month    Multiple ER visits, 11 over the past 2 weeks including today, suspect malingering vs related to untreated mental illness.    History of schizophrenia, states he is not taking medication. Denies SI/HI/hallucinations    Patient in no acute distress, vital signs stable, exam reassuring.    Soft, nontender abdomen.  No guarding or rigidity.    Recommend checking labs to rule out emergent etiology, patient declines.    Patient made aware that emergent etiology of abdominal pain cannot be ruled out without labs and/or imaging.  Patient adamantly declines.  He states he only wants medication for his symptoms.  He is made aware of risks of not obtaining labs or additional w/u and still declines.  GI cocktail and Pepcid  given.    We will send prescription for symptom management with follow-up instructions provided.    Discharge with outpatient follow-up seems reasonable at this time.  The results and plan of care were discussed, all questions sought and answered.  At time of d/c, pt appears well hydrated, and is in no acute distress, no apparent emergent issue at this time.  Pt will f/u with PCP vs GI and return as needed with any new or worsening symptoms or other concerns (given strict return precautions and advised to return for any new or worsening Sx). The patient is stable for discharge at this time.         Documentation/Prior Results Review:  Old medical records, Nursing notes, Care Everywhere record    Rhythm interpretation from monitor: N/A    Imaging Interpreted by me: Not Applicable    No orders to display     .     Discussion of Mangement with other Physicians, QHP or Appropriate Source:   case and management discussed with co-signing attending      .    Disposition:  Home    Discharge Medication List as of 11/02/2021 11:37 PM       START taking these medications    Details   dicyclomine (BENTYL) 20 mg PO TABS Take 1 Tab by Mouth 4 Times Daily As Needed (abdominal pain)., Disp-30 Tab, R-0, Normal           Chief Complaint   Patient presents with    ABDOMINAL PAIN     HPI     46 year old male complaining of upper mid abdominal pain intermittently x2 months.  States he did not take anything for symptoms.  States he is unsure what he was doing prior to symptom onset.  He states he is unsure what makes it better or worse.  Denies radiation.  Denies new foods in diet, sick contacts, new medication changes.  Denies history of known GI disorders.  Past medical history of schizophrenia.  He denies SI, HI, hallucinations.  Denies current fever, chest pain, shortness of breath, nausea, vomiting, stool changes, or any other symptoms or complaints at this time.    Review of Systems:  Constitutional:  Negative for fever.   HENT:   Negative for congestion.    Eyes:  Negative for discharge.   Respiratory:  Negative for shortness of breath.    Cardiovascular:  Negative for chest pain.   Gastrointestinal:  Positive for abdominal pain. Negative for nausea, vomiting, diarrhea, constipation and blood in stool.   Genitourinary:  Negative for dysuria and hematuria.   Skin:  Negative for color change, rash and wound.   Neurological:  Negative for syncope.   Hematological:  does not bruise/bleed easily.   Psychiatric/Behavioral:  Negative for substance abuse.      Physical Exam  Vitals reviewed.   Constitutional:       General: He is not in acute distress.     Appearance: He is well-developed. He is not ill-appearing or toxic-appearing.   HENT:      Head: Normocephalic and atraumatic.      Mouth/Throat:      Mouth: Mucous membranes are moist.      Pharynx: Oropharynx is clear.   Eyes:      Conjunctiva/sclera: Conjunctivae normal.   Cardiovascular:      Rate and Rhythm: Normal rate.   Pulmonary:      Effort: No respiratory distress.      Breath sounds: Normal breath sounds.   Abdominal:      General: There is no distension.      Palpations: Abdomen is soft. There is no mass.      Tenderness: There is no abdominal tenderness. There is no right CVA tenderness, left CVA tenderness, guarding or rebound.      Hernia: No hernia is present.   Musculoskeletal:      Cervical back: Neck supple.   Skin:     General: Skin is warm and dry.      Findings: No erythema or rash.   Neurological:      Mental Status: He is alert.   Psychiatric:         Mood and Affect: Mood normal.     Past Medical History:   Diagnosis Date    Schizophrenia, unspecified ([place])      Past Surgical History:   Procedure Laterality Date    NO SURGICAL PROCEDURES PERFORMED       Family History   Family history unknown: Yes     Social History     Occupational History    Not on file   Tobacco Use    Smoking status: Every Day    Smokeless tobacco: Never   Substance and Sexual Activity    Alcohol use:  Yes    Drug use: Not Currently    Sexual activity: Not on file     Outpatient Medications Marked as Taking for the 11/02/21  encounter [facility] Encounter)   Medication Sig Dispense Refill    dicyclomine (BENTYL) 20 mg PO TABS Take 1 Tab by Mouth 4 Times Daily As Needed (abdominal pain). 30 Tab 0     No Known Allergies    Vital Signs:  Patient Vitals for the past 72 hrs:   Temp Heart Rate Resp BP BP Mean SpO2 Weight   11/02/21 2248 -- -- -- -- -- -- 97.5 kg (215 lb)   11/02/21 2247 97.4 F (36.3 C) 88 16 124/78 93 MM HG 98 % --   11/02/21 2230 -- -- -- -- -- -- 97.5 kg (215 lb)     Diagnostics:  Labs:  No results found for this visit on 11/02/21.  ECG:  No results found for this visit on 11/02/21.    Medications ordered/given in the ED  Medications   Alum-Mag Hydroxide-Simeth DS (Mylanta DS) 400-400-40 mg/5 mL suspension 15 mL (15 mL Oral Given 11/02/21 2323)   famotidine (Pepcid) tablet 20 mg (20 mg Oral Given 11/02/21 2323)       Electronically signed by Delmer Islam, MD at 11/03/2021 10:14 PM EDT

## 2021-11-11 NOTE — ED Notes (Signed)
Formatting of this note might be different from the original.  Pain assessment on discharge was 0.  Condition stable.  Patient discharged to home.  Patient education was completed:  yes  Education taught to:  patient  Teaching method used was discussion.  Understanding of teaching was good.  Patient was discharged ambulatory.  Discharged with self.  Valuables were taken with patient.     Unit secretary arranging medicaid cab ride home for pt. Confirmed with pt correct address as listed on chart. Pt ambulated to waiting room to wait for cab.       Electronically signed by Wallis Mart, RN at 11/11/2021  5:32 AM EST

## 2021-11-11 NOTE — ED Triage Notes (Signed)
Formatting of this note might be different from the original.  Pt presents with complaint of L foot pain     Was just discharged from inpatient [facility] today     Is talking to himself during triage.     Denies SI or HI   Electronically signed by Benedetto Goad, RN at 11/11/2021 12:51 AM EST

## 2021-11-11 NOTE — ED Provider Notes (Signed)
Formatting of this note is different from the original.    [facility] EMERGENCY DEPARTMENT    Time of Arrival:   11/11/21 0036    Final diagnoses:   [M79.672] Left foot pain (Primary)   [Z86.59] History of schizophrenia     Medical Decision Making:      Differential Diagnosis:     Overuse  Sprain  Callus  Wart  Do not see signs of infectious or vascular etiology    Social Determinants of Health:  social factors reviewed, did not limit treatment                       Glasgow Coma Scale Score: 15          Supplemental Historians include:  patient    ED Course:   Pt is doodling in notebook drawing crosswords, pictures, diagrams and letters. He appears calm and cooperative. Review of medical records note he was discharged earlier from [facility]. No psych complaints at this time.    Declines xray but agrees to tylenol and warm pack. Declines new socks    Discussed conservative measures and suspect from overuse         Documentation/Prior Results Review:  Nursing notes, dc summary from [facility] a few hours ago    Rhythm interpretation from monitor: N/A    Imaging Interpreted by me: Not Applicable    No orders to display     .     Discussion of Mangement with other Physicians, QHP or Appropriate Source:   None    .    Disposition:  Home    Evaluated and treated patient under the supervision of  Dr Severiano Gilbert     This chart has been completed with the assistance of Paediatric nurse.  While every effort was made to proofread chart, there may be unrecognized errors in syntax and/or other voice recognition-related errors.  This chart may be edited at anytime to correct those errors.     Discharge Medication List as of 11/11/2021  4:53 AM       Chief Complaint   Patient presents with    FOOT PAIN     Zachary Mcintyre is a 46 y.o. male hx of schizophrenia c/o left foot pain today. He is not sure when it started hurting but sts it is sore on the bottom of his foot. Sts he has been walking a lot today but denies overt  injury. Denies swelling or discoloration. Denies numbness or tingling    History provided by:  Patient      Review of Systems:  Constitutional:  Negative for fever and chills.   HENT:  Negative for congestion and sore throat.    Eyes:  Negative for visual disturbance.   Respiratory:  Negative for cough and shortness of breath.    Cardiovascular:  Negative for chest pain and leg swelling.   Gastrointestinal:  Negative for nausea, vomiting and abdominal pain.   Genitourinary:  Negative for flank pain.   Musculoskeletal:  Negative for back pain.        Left foot pain   Skin:  Negative for rash.   Neurological:  Negative for headaches.     Physical Exam  Vitals and nursing note reviewed.   Constitutional:       General: He is not in acute distress.  HENT:      Head: Normocephalic and atraumatic.      Right Ear: External ear normal.  Left Ear: External ear normal.      Mouth/Throat:      Pharynx: Uvula midline.   Eyes:      Conjunctiva/sclera: Conjunctivae normal.   Cardiovascular:      Rate and Rhythm: Normal rate.   Pulmonary:      Effort: Pulmonary effort is normal.   Musculoskeletal:         General: No tenderness. Normal range of motion.      Cervical back: Normal range of motion and neck supple.      Comments: Large callus on plantar surface of left foot at level of the 4th mtp. Min tender on deep palpation. No overlying erythema or  fluctuance. No drainage or bleeding. Remainder of foot exam unremarkable. No skin breakdown, erythema or soft tissue swelling. No bony tenderness   Skin:     General: Skin is warm and dry.      Findings: No erythema.   Neurological:      Mental Status: He is alert and oriented to person, place, and time.     Past Medical History:   Diagnosis Date    Schizophrenia, unspecified (HCC)      Past Surgical History:   Procedure Laterality Date    NO SURGICAL PROCEDURES PERFORMED       Family History   Family history unknown: Yes     Social History     Occupational History    Not on file    Tobacco Use    Smoking status: Every Day    Smokeless tobacco: Never   Substance and Sexual Activity    Alcohol use: Yes    Drug use: Not Currently    Sexual activity: Not on file     No outpatient medications have been marked as taking for the 11/11/21 encounter [facility] Encounter).     No Known Allergies    Vital Signs:  Patient Vitals for the past 72 hrs:   Temp Heart Rate Resp BP BP Mean SpO2   11/11/21 0533 -- 76 18 114/76 89 MM HG 98 %   11/11/21 0047 97 F (36.1 C) 99 16 126/72 90 MM HG 98 %     Diagnostics:  Labs:  No results found for this visit on 11/11/21.  ECG:  No results found for this visit on 11/11/21.    Medications ordered/given in the ED  Medications - No data to display      Electronically signed by Sydell Axon, MD at 11/11/2021 10:00 PM EST

## 2021-11-11 NOTE — ED Provider Notes (Signed)
Formatting of this note is different from the original.    [facility] EMERGENCY DEPARTMENT    Time of Arrival:   11/11/21 0036    Final diagnoses:   [M79.672] Left foot pain (Primary)   [Z86.59] History of schizophrenia     Medical Decision Making:      Differential Diagnosis:     Overuse  Sprain  Callus  Wart  Do not see signs of infectious or vascular etiology    Social Determinants of Health:  social factors reviewed, did not limit treatment                       Glasgow Coma Scale Score: 15          Supplemental Historians include:  patient    ED Course:   Pt is doodling in notebook drawing crosswords, pictures, diagrams and letters. He appears calm and cooperative. Review of medical records note he was discharged earlier from [facility]. No psych complaints at this time.    Declines xray but agrees to tylenol and warm pack. Declines new socks    Discussed conservative measures and suspect from overuse         Documentation/Prior Results Review:  Nursing notes, dc summary from [facility] a few hours ago    Rhythm interpretation from monitor: N/A    Imaging Interpreted by me: Not Applicable    No orders to display     .     Discussion of Mangement with other Physicians, QHP or Appropriate Source:   None    .    Disposition:  Home    Evaluated and treated patient under the supervision of  Dr Severiano Gilbert     This chart has been completed with the assistance of Paediatric nurse.  While every effort was made to proofread chart, there may be unrecognized errors in syntax and/or other voice recognition-related errors.  This chart may be edited at anytime to correct those errors.     Discharge Medication List as of 11/11/2021  4:53 AM       Chief Complaint   Patient presents with    FOOT PAIN     Zachary Mcintyre is a 46 y.o. male hx of schizophrenia c/o left foot pain today. He is not sure when it started hurting but sts it is sore on the bottom of his foot. Sts he has been walking a lot today but denies overt  injury. Denies swelling or discoloration. Denies numbness or tingling    History provided by:  Patient      Review of Systems:  Constitutional:  Negative for fever and chills.   HENT:  Negative for congestion and sore throat.    Eyes:  Negative for visual disturbance.   Respiratory:  Negative for cough and shortness of breath.    Cardiovascular:  Negative for chest pain and leg swelling.   Gastrointestinal:  Negative for nausea, vomiting and abdominal pain.   Genitourinary:  Negative for flank pain.   Musculoskeletal:  Negative for back pain.        Left foot pain   Skin:  Negative for rash.   Neurological:  Negative for headaches.     Physical Exam  Vitals and nursing note reviewed.   Constitutional:       General: He is not in acute distress.  HENT:      Head: Normocephalic and atraumatic.      Right Ear: External ear normal.  Left Ear: External ear normal.      Mouth/Throat:      Pharynx: Uvula midline.   Eyes:      Conjunctiva/sclera: Conjunctivae normal.   Cardiovascular:      Rate and Rhythm: Normal rate.   Pulmonary:      Effort: Pulmonary effort is normal.   Musculoskeletal:         General: No tenderness. Normal range of motion.      Cervical back: Normal range of motion and neck supple.      Comments: Large callus on plantar surface of left foot at level of the 4th mtp. Min tender on deep palpation. No overlying erythema or  fluctuance. No drainage or bleeding. Remainder of foot exam unremarkable. No skin breakdown, erythema or soft tissue swelling. No bony tenderness   Skin:     General: Skin is warm and dry.      Findings: No erythema.   Neurological:      Mental Status: He is alert and oriented to person, place, and time.     Past Medical History:   Diagnosis Date    Schizophrenia, unspecified ([place])      Past Surgical History:   Procedure Laterality Date    NO SURGICAL PROCEDURES PERFORMED       Family History   Family history unknown: Yes     Social History     Occupational History    Not on file    Tobacco Use    Smoking status: Every Day    Smokeless tobacco: Never   Substance and Sexual Activity    Alcohol use: Yes    Drug use: Not Currently    Sexual activity: Not on file     No outpatient medications have been marked as taking for the 11/11/21 encounter [facility] Encounter).     No Known Allergies    Vital Signs:  Patient Vitals for the past 72 hrs:   Temp Heart Rate Resp BP BP Mean SpO2   11/11/21 0533 -- 76 18 114/76 89 MM HG 98 %   11/11/21 0047 97 F (36.1 C) 99 16 126/72 90 MM HG 98 %     Diagnostics:  Labs:  No results found for this visit on 11/11/21.  ECG:  No results found for this visit on 11/11/21.    Medications ordered/given in the ED  Medications - No data to display      Electronically signed by Shearon Balo, MD at 11/11/2021 10:00 PM EST

## 2021-11-11 NOTE — ED Triage Notes (Signed)
Formatting of this note might be different from the original.  Pt presents with complaint of L foot pain     Was just discharged from inpatient [facility] today     Is talking to himself during triage.     Denies SI or HI   Electronically signed by Janan Ridge, RN at 11/11/2021 12:51 AM EST

## 2021-11-11 NOTE — ED Notes (Signed)
Formatting of this note might be different from the original.  Pain assessment on discharge was 0.  Condition stable.  Patient discharged to home.  Patient education was completed:  yes  Education taught to:  patient  Teaching method used was discussion.  Understanding of teaching was good.  Patient was discharged ambulatory.  Discharged with self.  Valuables were taken with patient.     Unit secretary arranging medicaid cab ride home for pt. Confirmed with pt correct address as listed on chart. Pt ambulated to waiting room to wait for cab.       Electronically signed by Marylou Flesher, RN at 11/11/2021  5:32 AM EST

## 2021-11-13 NOTE — ED Notes (Signed)
Formatting of this note might be different from the original.  Patient provided bus pass for transport home.     Pain assessment on discharge was stable.  Condition stable.  Patient discharged to home.  Patient education was completed:  yes  Education taught to:  patient  Teaching method used was discussion and handout.  Understanding of teaching was poor.  Patient was discharged ambulatory.  Discharged with self.  Valuables were given to: patient.    Electronically signed by Shelly Coss, RN at 11/13/2021 10:29 AM EST

## 2021-11-13 NOTE — ED Notes (Signed)
Formatting of this note might be different from the original.  Attempted to get blood work from patient. Patient demanding with care, insisting on side rail being up, pointing to which vein to use, moving arm while this nurse assessing for best access point. This RN requested another staff member to obtain blood work.   Electronically signed by Shelly Coss, RN at 11/13/2021  9:40 AM EST

## 2021-11-13 NOTE — ED Notes (Signed)
Formatting of this note might be different from the original.  Assumed care of patient. Patient declines blood draw at this time stating "I can't feel my body, I need to calm down first". Patient offered ativan that had been ordered and patient requesting "something better like dilaudid or percocet". Explained that those are pain medications, not anxiety medications and he states "They help with emotional support". Dr. Marilynne Drivers made aware and patient given ativan and motrin per [facility].    Patient offered graham crackers and peanut butter as unit is out of box lunches at this time. Patient requesting a hot meal "can't you just order one, or heat up one of those microwave meals". Patient accepted crackers after being told those were not available in the unit at this time.     Patient aware that we need blood work and a urine sample. Will attempt blood work again shortly.  Electronically signed by Ferd Hibbs, RN at 11/13/2021  9:00 AM EST

## 2021-11-13 NOTE — Consults (Signed)
Associated Order(s): CONSULT TO PERS  Formatting of this note might be different from the original.  Patient evaluated by [facility] and is appropriate for discharge.  See discharge instructions.  Electronically signed by Irineo Axon L at 11/13/2021  9:49 AM EST

## 2021-11-13 NOTE — Progress Notes (Signed)
Formatting of this note is different from the original.      Emergency Psychiatric Assessment Documentation  EPAD    History of Present Illness   Patient Identification  Zachary Mcintyre is a 46 y.o. male.    Chief Complaint   Patient Active Problem List   Diagnosis    Mood disorder (HCC)    Tobacco use disorder    Alcohol use disorder     History Of Present Illness  (History of current episode including symptoms, signs, behavior indicative of DSM IV/5 behavior) Patient identified self by providing full name and date of birth and consented for assessment to be completed via telepsych.  There were no malfunctions in operations of audio or visual connectivity throughout assessment.    The patient is a 46 year old male who presents in a calm and cooperative manner.  When asked why he came to the ED, the patient stated, ?I need help getting a job and I don't have money.?    The patient was admitted for inpatient psych on Nov. 5th, and discharged on the 8th.  He returns to the ED, two days after being discharged, without acute psych symptoms.  The patient may be malingering.  He denies SI, Hi, and contracts for safety.  He has a handful of complaints related to work, finances, and living situation.  He also appears to be focused on food and watching television.  He says he has family in the area, but would prefer not to reach out to them.      Presenting Problem: stress    MSE: Alert.  The patient has limited insight.       Psych Hx: Inpatient psych last week.  Discharged two days ago.    Substance Abuse: denied    Stressors:  Slept outside last night, but does have a residence to live in.      Grief: denied.      Legal: denied.        Disposition: Patient to be discharged.         Is client a smoker?  Social History     Tobacco Use   Smoking Status Every Day   Smokeless Tobacco Never       Vital Signs   Patient Vitals for the past 24 hrs:   Temp Heart Rate Resp BP BP Mean SpO2 Weight   11/13/21 0756 -- -- -- -- -- -- 94.9  kg (209 lb 3.5 oz)   11/13/21 0755 96.4 F (35.8 C) 85 18 121/79 93 MM HG 100 % --     Recent Labs     Admission on 11/13/2021   Component Date Value Ref Range Status    [facility] PDMP 11/13/2021 1   Final    [facility] FREQUENCY 11/13/2021 1   Final    [facility] FACILITY 11/13/2021 1   Final     Social / Developmental History     Social History     Socioeconomic History    Marital status: Single   Tobacco Use    Smoking status: Every Day    Smokeless tobacco: Never   Substance and Sexual Activity    Alcohol use: Yes    Drug use: Not Currently     Family History   Family history unknown: Yes     General Evaluation of Patient's Support Systems   Questionably supportive: System is unsure if patient can be supported.  Describe support system available to patient:    Psychiatric Treatment History  Currently in treatment? no  Current [facility] client? no  If yes, city:     Level of Care Facility/Therapist Dates Voluntary or TDO LOS Release signed?   Inpatient            Level of Care Facility/Therapist Dates Type of treatment LOS Release signed?   Outpatient            Medical History   Primary Care Physician: None No PCP PCP, MD  [address] / [place] [address]  None  Allergies: No Known Allergies  Past Medical History:   Diagnosis Date    Schizophrenia, unspecified (HCC)      No current facility-administered medications for this encounter.    Current Outpatient Medications:     folic acid (FOLVITE) 1 mg PO TABS, Take 1 Tab by Mouth Once a Day., Disp: 30 Tab, Rfl: 0    ibuprofen (ADVIL;MOTRIN) 800 mg PO TABS, Take 1 Tab by Mouth 3 Times Daily As Needed for Moderate Pain (Pain Score 4-6)., Disp: 30 Tab, Rfl: 0    nystatin (MYCOSTATIN) 100,000 unit/gram Top CREA, Use 1 Application  to affected area Twice Daily., Disp: 15 g, Rfl: 1    OLANZapine (ZYPREXA) 5 mg PO TABS, Take 1 Tab by Mouth 2 (two) times a day., Disp: 60 Tab, Rfl: 0    therapeutic multivitamin-minerals (THERAGRAN-M) PO TABS, Take 1 Tab by Mouth Once a Day.,  Disp: 30 Tab, Rfl: 0    thiamine 100 mg PO TABS, Take 1 Tab by Mouth Once a Day., Disp: 30 Tab, Rfl: 0  No current facility-administered medications on file prior to encounter.     Current Outpatient Medications on File Prior to Encounter   Medication Sig Dispense Refill    folic acid (FOLVITE) 1 mg PO TABS Take 1 Tab by Mouth Once a Day. 30 Tab 0    ibuprofen (ADVIL;MOTRIN) 800 mg PO TABS Take 1 Tab by Mouth 3 Times Daily As Needed for Moderate Pain (Pain Score 4-6). 30 Tab 0    nystatin (MYCOSTATIN) 100,000 unit/gram Top CREA Use 1 Application  to affected area Twice Daily. 15 g 1    OLANZapine (ZYPREXA) 5 mg PO TABS Take 1 Tab by Mouth 2 (two) times a day. 60 Tab 0    therapeutic multivitamin-minerals (THERAGRAN-M) PO TABS Take 1 Tab by Mouth Once a Day. 30 Tab 0    thiamine 100 mg PO TABS Take 1 Tab by Mouth Once a Day. 30 Tab 0     Disposition   Admission Date: 11/13/2021  8:02 AM  Admitting Physician: No admitting provider for patient encounter.    Disposition: discharge  Referred to : see discharge instructions  Appointments: TBD    Electronically signed by Irineo Axon L at 11/13/2021  9:46 AM EST

## 2021-11-13 NOTE — Consults (Signed)
Associated Order(s): CONSULT TO PERS  Formatting of this note might be different from the original.  Patient evaluated by [facility] and is appropriate for discharge.  See discharge instructions.  Electronically signed by Selinda Eon L at 11/13/2021  9:49 AM EST

## 2021-11-13 NOTE — ED Notes (Signed)
Formatting of this note might be different from the original.  Patient provided bus pass for transport home.     Pain assessment on discharge was stable.  Condition stable.  Patient discharged to home.  Patient education was completed:  yes  Education taught to:  patient  Teaching method used was discussion and handout.  Understanding of teaching was poor.  Patient was discharged ambulatory.  Discharged with self.  Valuables were given to: patient.    Electronically signed by Ferd Hibbs, RN at 11/13/2021 10:29 AM EST

## 2021-11-13 NOTE — Progress Notes (Signed)
Formatting of this note is different from the original.      Emergency Psychiatric Assessment Documentation  EPAD    History of Present Illness   Patient Identification  Zachary Mcintyre is a 46 y.o. male.    Chief Complaint   Patient Active Problem List   Diagnosis    Mood disorder (HCC)    Tobacco use disorder    Alcohol use disorder     History Of Present Illness  (History of current episode including symptoms, signs, behavior indicative of DSM IV/5 behavior) Patient identified self by providing full name and date of birth and consented for assessment to be completed via telepsych.  There were no malfunctions in operations of audio or visual connectivity throughout assessment.    The patient is a 46 year old male who presents in a calm and cooperative manner.  When asked why he came to the ED, the patient stated, ?I need help getting a job and I don't have money.?    The patient was admitted for inpatient psych on Nov. 5th, and discharged on the 8th.  He returns to the ED, two days after being discharged, without acute psych symptoms.  The patient may be malingering.  He denies SI, Hi, and contracts for safety.  He has a handful of complaints related to work, finances, and living situation.  He also appears to be focused on food and watching television.  He says he has family in the area, but would prefer not to reach out to them.      Presenting Problem: stress    MSE: Alert.  The patient has limited insight.       Psych Hx: Inpatient psych last week.  Discharged two days ago.    Substance Abuse: denied    Stressors:  Slept outside last night, but does have a residence to live in.      Grief: denied.      Legal: denied.        Disposition: Patient to be discharged.         Is client a smoker?  Social History     Tobacco Use   Smoking Status Every Day   Smokeless Tobacco Never       Vital Signs   Patient Vitals for the past 24 hrs:   Temp Heart Rate Resp BP BP Mean SpO2 Weight   11/13/21 0756 -- -- -- -- -- -- 94.9  kg (209 lb 3.5 oz)   11/13/21 0755 96.4 F (35.8 C) 85 18 121/79 93 MM HG 100 % --     Recent Labs     Admission on 11/13/2021   Component Date Value Ref Range Status    [facility] PDMP 11/13/2021 1   Final    [facility] FREQUENCY 11/13/2021 1   Final    [facility] FACILITY 11/13/2021 1   Final     Social / Developmental History     Social History     Socioeconomic History    Marital status: Single   Tobacco Use    Smoking status: Every Day    Smokeless tobacco: Never   Substance and Sexual Activity    Alcohol use: Yes    Drug use: Not Currently     Family History   Family history unknown: Yes     General Evaluation of Patient's Support Systems   Questionably supportive: System is unsure if patient can be supported.  Describe support system available to patient:    Psychiatric Treatment History  Currently in treatment? no  Current [facility] client? no  If yes, city:     Level of Care Facility/Therapist Dates Voluntary or TDO LOS Release signed?   Inpatient            Level of Care Facility/Therapist Dates Type of treatment LOS Release signed?   Outpatient            Medical History   Primary Care Physician: None No PCP PCP, MD  [address] / [place] [address]  None  Allergies: No Known Allergies  Past Medical History:   Diagnosis Date    Schizophrenia, unspecified ([place])      No current facility-administered medications for this encounter.    Current Outpatient Medications:     folic acid (FOLVITE) 1 mg PO TABS, Take 1 Tab by Mouth Once a Day., Disp: 30 Tab, Rfl: 0    ibuprofen (ADVIL;MOTRIN) 800 mg PO TABS, Take 1 Tab by Mouth 3 Times Daily As Needed for Moderate Pain (Pain Score 4-6)., Disp: 30 Tab, Rfl: 0    nystatin (MYCOSTATIN) 100,000 unit/gram Top CREA, Use 1 Application  to affected area Twice Daily., Disp: 15 g, Rfl: 1    OLANZapine (ZYPREXA) 5 mg PO TABS, Take 1 Tab by Mouth 2 (two) times a day., Disp: 60 Tab, Rfl: 0    therapeutic multivitamin-minerals (THERAGRAN-M) PO TABS, Take 1 Tab by Mouth Once a Day.,  Disp: 30 Tab, Rfl: 0    thiamine 100 mg PO TABS, Take 1 Tab by Mouth Once a Day., Disp: 30 Tab, Rfl: 0  No current facility-administered medications on file prior to encounter.     Current Outpatient Medications on File Prior to Encounter   Medication Sig Dispense Refill    folic acid (FOLVITE) 1 mg PO TABS Take 1 Tab by Mouth Once a Day. 30 Tab 0    ibuprofen (ADVIL;MOTRIN) 800 mg PO TABS Take 1 Tab by Mouth 3 Times Daily As Needed for Moderate Pain (Pain Score 4-6). 30 Tab 0    nystatin (MYCOSTATIN) 100,000 unit/gram Top CREA Use 1 Application  to affected area Twice Daily. 15 g 1    OLANZapine (ZYPREXA) 5 mg PO TABS Take 1 Tab by Mouth 2 (two) times a day. 60 Tab 0    therapeutic multivitamin-minerals (THERAGRAN-M) PO TABS Take 1 Tab by Mouth Once a Day. 30 Tab 0    thiamine 100 mg PO TABS Take 1 Tab by Mouth Once a Day. 30 Tab 0     Disposition   Admission Date: 11/13/2021  8:02 AM  Admitting Physician: No admitting provider for patient encounter.    Disposition: discharge  Referred to : see discharge instructions  Appointments: TBD    Electronically signed by Selinda Eon L at 11/13/2021  9:46 AM EST

## 2021-11-13 NOTE — Progress Notes (Signed)
Formatting of this note might be different from the original.  PERS Note:  Responded. Consult requested by Dr. Bourke for patient presenting with hx schizophrenia, anxiety, denied SI/HI, reported being in "emotional pain".  PERS to see as soon as possible.      A. Rivera MSW, QMHP-A  Supervisee in Social Work  PERS Clinician [phone]  Electronically signed by Rivera, Amber R at 11/13/2021  8:39 AM EST

## 2021-11-13 NOTE — ED Triage Notes (Signed)
Formatting of this note might be different from the original.  Patient reports that he's been having shortness of breath.  Patient speaking in full sentences.  No apparent distress.    When further questioned, patient reports that he's been having "emotional pain"    Denies SI/HI.    Patient requesting to speak with a counselor.      Electronically signed by Holly Bodily, RN at 11/13/2021  8:02 AM EST

## 2021-11-13 NOTE — ED Provider Notes (Signed)
Formatting of this note is different from the original.    [facility] EMERGENCY DEPARTMENT    Time of Arrival:   11/13/21 0751    Final diagnoses:   [F20.9] Schizophrenia, unspecified type (HCC) (Primary)     Medical Decision Making:      Differential Diagnosis:     Schizophrenia, request for psych evaulation. No physical complaints currently, etoh, uds, ativan for anxiety, no current SI, HI  8:33 AM    Social Determinants of Health:  substance use                              Supplemental Historians include:  patient    ED Course:     Seen by pers, no role for inpatient psych.         Documentation/Prior Results Review:  Nursing notes    Rhythm interpretation from monitor: N/A    Imaging Interpreted by me: Not Applicable    No orders to display     .     Discussion of Mangement with other Physicians, QHP or Appropriate Source:   PERS and discussion for potential role for inpatient psychiatric admission not deemed necessary at this time    .    Disposition:  Home    New Prescriptions    No medications on file     Chief Complaint   Patient presents with    PSYCH EVALUATION (SCREENING)     HPI     46 year old male history of schizophrenia who presents emergency department today saying that he has "emotional pain" and would like to talk to a counselor denies suicidal ideation or homicidal ideation. Denies physical symptoms to me. Reports no ETOH in a few days. Reports Tobacco use.     Review of Systems    Physical Exam  Vitals and nursing note reviewed.   Constitutional:       General: He is not in acute distress.     Appearance: He is not diaphoretic.      Comments: Smells of smoke   HENT:      Head: Normocephalic and atraumatic.   Eyes:      Pupils: Pupils are equal, round, and reactive to light.   Cardiovascular:      Rate and Rhythm: Normal rate and regular rhythm.      Heart sounds: Normal heart sounds. No murmur heard.  Pulmonary:      Effort: Pulmonary effort is normal.      Breath sounds:  Normal breath sounds.   Abdominal:      General: There is no distension.      Palpations: Abdomen is soft.      Tenderness: There is no abdominal tenderness.   Musculoskeletal:         General: Normal range of motion.      Cervical back: Normal range of motion and neck supple.   Skin:     General: Skin is warm and dry.   Neurological:      Mental Status: He is alert and oriented to person, place, and time.   Psychiatric:         Mood and Affect: Mood normal.         Behavior: Behavior normal.     Past Medical History:   Diagnosis Date    Schizophrenia, unspecified (HCC)      Past Surgical History:   Procedure Laterality Date    NO SURGICAL  PROCEDURES PERFORMED       Family History   Family history unknown: Yes     Social History     Occupational History    Not on file   Tobacco Use    Smoking status: Every Day    Smokeless tobacco: Never   Substance and Sexual Activity    Alcohol use: Yes    Drug use: Not Currently    Sexual activity: Not on file     No outpatient medications have been marked as taking for the 11/13/21 encounter [facility] Encounter).     No Known Allergies    Vital Signs:  Patient Vitals for the past 72 hrs:   Temp Heart Rate Resp BP BP Mean SpO2 Weight   11/13/21 0756 -- -- -- -- -- -- 94.9 kg (209 lb 3.5 oz)   11/13/21 0755 96.4 F (35.8 C) 85 18 121/79 93 MM HG 100 % --     Diagnostics:  Labs:  No results found for this visit on 11/13/21.  ECG:  No results found for this visit on 11/13/21.    Medications ordered/given in the ED  Medications   LORazepam (Ativan) tablet 0.5 mg (0.5 mg Oral Given 11/13/21 0846)   ibuprofen (Advil;Motrin) tablet 600 mg (600 mg Oral Given 11/13/21 0846)       Electronically signed by Darden Dates, MD at 11/13/2021  9:51 AM EST

## 2021-11-13 NOTE — ED Notes (Signed)
Formatting of this note might be different from the original.  Attempted to get blood work from patient. Patient demanding with care, insisting on side rail being up, pointing to which vein to use, moving arm while this nurse assessing for best access point. This RN requested another staff member to obtain blood work.   Electronically signed by Ferd Hibbs, RN at 11/13/2021  9:40 AM EST

## 2021-11-13 NOTE — ED Provider Notes (Signed)
Formatting of this note is different from the original.    [facility]    Time of Arrival:   11/13/21 0751    Final diagnoses:   [F20.9] Schizophrenia, unspecified type ([place]) (Primary)     Medical Decision Making:      Differential Diagnosis:     Schizophrenia, request for psych evaulation. No physical complaints currently, etoh, uds, ativan for anxiety, no current SI, HI  8:33 AM    Social Determinants of Health:  substance use                              Supplemental Historians include:  patient    ED Course:     Seen by pers, no role for inpatient psych.         Documentation/Prior Results Review:  Nursing notes    Rhythm interpretation from monitor: N/A    Imaging Interpreted by me: Not Applicable    No orders to display     .     Discussion of Mangement with other Physicians, QHP or Appropriate Source:   PERS and discussion for potential role for inpatient psychiatric admission not deemed necessary at this time    .    Disposition:  Home    New Prescriptions    No medications on file     Chief Complaint   Patient presents with    PSYCH EVALUATION (SCREENING)     HPI     46 year old male history of schizophrenia who presents emergency department today saying that he has "emotional pain" and would like to talk to a counselor denies suicidal ideation or homicidal ideation. Denies physical symptoms to me. Reports no ETOH in a few days. Reports Tobacco use.     Review of Systems    Physical Exam  Vitals and nursing note reviewed.   Constitutional:       General: He is not in acute distress.     Appearance: He is not diaphoretic.      Comments: Smells of smoke   HENT:      Head: Normocephalic and atraumatic.   Eyes:      Pupils: Pupils are equal, round, and reactive to light.   Cardiovascular:      Rate and Rhythm: Normal rate and regular rhythm.      Heart sounds: Normal heart sounds. No murmur heard.  Pulmonary:      Effort: Pulmonary effort is normal.      Breath sounds:  Normal breath sounds.   Abdominal:      General: There is no distension.      Palpations: Abdomen is soft.      Tenderness: There is no abdominal tenderness.   Musculoskeletal:         General: Normal range of motion.      Cervical back: Normal range of motion and neck supple.   Skin:     General: Skin is warm and dry.   Neurological:      Mental Status: He is alert and oriented to person, place, and time.   Psychiatric:         Mood and Affect: Mood normal.         Behavior: Behavior normal.     Past Medical History:   Diagnosis Date    Schizophrenia, unspecified ([place])      Past Surgical History:   Procedure Laterality Date    NO SURGICAL  PROCEDURES PERFORMED       Family History   Family history unknown: Yes     Social History     Occupational History    Not on file   Tobacco Use    Smoking status: Every Day    Smokeless tobacco: Never   Substance and Sexual Activity    Alcohol use: Yes    Drug use: Not Currently    Sexual activity: Not on file     No outpatient medications have been marked as taking for the 11/13/21 encounter [facility] Encounter).     No Known Allergies    Vital Signs:  Patient Vitals for the past 72 hrs:   Temp Heart Rate Resp BP BP Mean SpO2 Weight   11/13/21 0756 -- -- -- -- -- -- 94.9 kg (209 lb 3.5 oz)   11/13/21 0755 96.4 F (35.8 C) 85 18 121/79 93 MM HG 100 % --     Diagnostics:  Labs:  No results found for this visit on 11/13/21.  ECG:  No results found for this visit on 11/13/21.    Medications ordered/given in the ED  Medications   LORazepam (Ativan) tablet 0.5 mg (0.5 mg Oral Given 11/13/21 0846)   ibuprofen (Advil;Motrin) tablet 600 mg (600 mg Oral Given 11/13/21 0846)       Electronically signed by Darden Dates, MD at 11/13/2021  9:51 AM EST

## 2021-11-17 NOTE — ED Provider Notes (Signed)
Formatting of this note is different from the original.      [facility] EMERGENCY DEPARTMENT    Final diagnoses:   None     Assessment: Consider Joint Sprain, Bone Fracture, Muscle Strain, Soft Tissue Contusion, Dislocation, Bursitis, Tendonitis, Ligament injury, less likely septic joint, dvt, osteo    Plan:  Pt is well-appearing, nontoxic, and afebrile.  Pt is hemodynamically stable. No s/sx septic joint. Plan is for expectant and symptomatic care.   Pt to follow-up with PCP  and Return to ER with ANY new or worsening symptoms.      Disposition:  Home    New Prescriptions    No medications on file     Chief Complaint:  Foot Injury/Pain    HPI:  46 y.o. male with history of schizophrenia and homelessness to is well-known to the department.  He generally complains of foot pain and feels better after Tylenol.  He denies injury.  No redness or swelling.    Patient able to walk.  Patient noted no swelling.    ROS:  Patient denies other joint pain, fevers, erythema    Past/Family/Social History:  Patient has no previous injury to this foot    Past Medical History:   Diagnosis Date    Schizophrenia, unspecified (HCC)      No outpatient medications have been marked as taking for the 11/17/21 encounter [facility] Encounter).     No Known Allergies    Exam:   Vital Signs:  Patient Vitals for the past 72 hrs:   Temp Heart Rate Resp BP BP Mean SpO2 Weight   11/17/21 0520 -- -- -- -- -- -- 101.5 kg (223 lb 12.3 oz)   11/17/21 0519 98.2 F (36.8 C) 106 20 140/76 97 MM HG 97 % --     General:  Nontoxic alert and oriented.  Ankle & Midfoot: bilateral  Inspection:  normal and calluses.    Patient able to weight bear in ED.  Palpation:  Tenderness not present.  Proximal fibula nontender.  Neurovascular exam intact for perfusion and function.    Diagnostics:    No results found for this or any previous visit (from the past 36 hour(s)).      Electronically signed by Nance Pew, MD at 11/17/2021  7:36 PM EST

## 2021-11-17 NOTE — ED Triage Notes (Signed)
Formatting of this note might be different from the original.  Complaining of bilateral foot pain for 3-4 months worse for last 25 hours.  Here 4 days ago for Schizophrenia.  Talking to himself.  Poor Historian.  Denies Hx of Gout.  Electronically signed by Yisroel Ramming, RN at 11/17/2021  5:19 AM EST

## 2021-11-17 NOTE — ED Triage Notes (Signed)
Formatting of this note might be different from the original.  Complaining of bilateral foot pain for 3-4 months worse for last 25 hours.  Here 4 days ago for Schizophrenia.  Talking to himself.  Poor Historian.  Denies Hx of Gout.  Electronically signed by Idell Pickles, RN at 11/17/2021  5:19 AM EST

## 2021-11-17 NOTE — ED Notes (Signed)
Formatting of this note might be different from the original.  Special instructions given to Patient.  Follow-up with PCP.  Discharged to [facility] wants a Medicaid cab.  Electronically signed by Yisroel Ramming, RN at 11/17/2021  6:26 AM EST

## 2021-11-17 NOTE — ED Notes (Signed)
Formatting of this note might be different from the original.  Special instructions given to Patient.  Follow-up with PCP.  Discharged to [place] wants a Medicaid cab.  Electronically signed by Idell Pickles, RN at 11/17/2021  6:26 AM EST

## 2021-11-17 NOTE — ED Provider Notes (Signed)
Formatting of this note is different from the original.      [place]    Final diagnoses:   None     Assessment: Consider Joint Sprain, Bone Fracture, Muscle Strain, Soft Tissue Contusion, Dislocation, Bursitis, Tendonitis, Ligament injury, less likely septic joint, dvt, osteo    Plan:  Pt is well-appearing, nontoxic, and afebrile.  Pt is hemodynamically stable. No s/sx septic joint. Plan is for expectant and symptomatic care.   Pt to follow-up with PCP  and Return to ER with ANY new or worsening symptoms.      Disposition:  Home    New Prescriptions    No medications on file     Chief Complaint:  Foot Injury/Pain    HPI:  46 y.o. male with history of schizophrenia and homelessness to is well-known to the department.  He generally complains of foot pain and feels better after Tylenol.  He denies injury.  No redness or swelling.    Patient able to walk.  Patient noted no swelling.    ROS:  Patient denies other joint pain, fevers, erythema    Past/Family/Social History:  Patient has no previous injury to this foot    Past Medical History:   Diagnosis Date    Schizophrenia, unspecified ([place])      No outpatient medications have been marked as taking for the 11/17/21 encounter [facility] Encounter).     No Known Allergies    Exam:   Vital Signs:  Patient Vitals for the past 72 hrs:   Temp Heart Rate Resp BP BP Mean SpO2 Weight   11/17/21 0520 -- -- -- -- -- -- 101.5 kg (223 lb 12.3 oz)   11/17/21 0519 98.2 F (36.8 C) 106 20 140/76 97 MM HG 97 % --     General:  Nontoxic alert and oriented.  Ankle & Midfoot: bilateral  Inspection:  normal and calluses.    Patient able to weight bear in ED.  Palpation:  Tenderness not present.  Proximal fibula nontender.  Neurovascular exam intact for perfusion and function.    Diagnostics:    No results found for this or any previous visit (from the past 36 hour(s)).      Electronically signed by Shelda Pal, MD at 11/17/2021  7:36 PM EST

## 2021-12-05 NOTE — ED Triage Notes (Signed)
Formatting of this note might be different from the original.  Pt to ED with complains with full body ache and right foot pain X 6 hours. Pt reluctant to answer questions in triage.  Electronically signed by Liz Beach, RN at 12/05/2021  2:13 AM EST

## 2021-12-05 NOTE — ED Notes (Signed)
Formatting of this note might be different from the original.  Called multiple times in ER waiting room. No answer. Checked with security who states the patient left with his bike.  Electronically signed by Gweneth Dimitri, RN at 12/05/2021  3:57 AM EST

## 2021-12-05 NOTE — ED Provider Notes (Signed)
Formatting of this note might be different from the original.  Left prior to being seen by EPT  Electronically signed by Lanier Clam, MD at 12/05/2021  4:13 AM EST

## 2021-12-05 NOTE — ED Triage Notes (Signed)
Formatting of this note might be different from the original.  Pt to ED with complains with full body ache and right foot pain X 6 hours. Pt reluctant to answer questions in triage.  Electronically signed by Melburn Hake, RN at 12/05/2021  2:13 AM EST

## 2021-12-05 NOTE — ED Notes (Signed)
Formatting of this note might be different from the original.  Called multiple times in ER waiting room. No answer. Checked with security who states the patient left with his bike.  Electronically signed by Marica Otter, RN at 12/05/2021  3:57 AM EST

## 2021-12-05 NOTE — ED Provider Notes (Signed)
Formatting of this note might be different from the original.  Left prior to being seen by EPT  Electronically signed by Alvy Beal, MD at 12/05/2021  4:13 AM EST

## 2021-12-07 NOTE — ED Notes (Signed)
Formatting of this note might be different from the original.  Pain assessment on discharge was n/a.  Condition stable.  Patient discharged to home.  Patient education was completed:  yes  Education taught to:  patient  Teaching method used was discussion.  Understanding of teaching was good.  Patient was discharged ambulatory.  Discharged with self.  Valuables were given to: patient.    Electronically signed by Janace Aris, RN at 12/07/2021  2:50 PM EST

## 2021-12-07 NOTE — ED Notes (Signed)
Formatting of this note might be different from the original.  Probation officer huddled with charge nurse, ED Merchandiser, retail about pt preferring to stay in [place] as opposed to in the [place]. Pt has not been presenting as a danger to himself or others, but has been wandering to the main hospital and outside to smoke.     Pt states he does not want to come back into the [facility] and doesn't want to talk to [place]. Pt states he needs help with getting on a computer to get an code he needs to cash his disability checks. Offered to find him a [employer] and bus pass, which he accepted. Discussed with PA, who is ok to discharge with this plan.  Electronically signed by Janace Aris, RN at 12/07/2021  2:39 PM EST

## 2021-12-07 NOTE — Progress Notes (Signed)
Formatting of this note might be different from the original.  Telepsych requested    Telepsych setup pending, PERS will attempt again as soon as possible  Electronically signed by Rosary Lively at 12/07/2021 12:54 PM EST

## 2021-12-07 NOTE — ED Provider Notes (Signed)
Formatting of this note might be different from the original.  Pt has declined PERS evaluation, he denies any SI or HI, has place to go. Will discharge home.     Delora Fuel, PA-C  Electronically signed by Darden Dates, MD at 12/08/2021 11:19 PM EST

## 2021-12-07 NOTE — Consults (Signed)
Associated Order(s): CONSULT TO PERS  Formatting of this note might be different from the original.  PERS Consult Complete: Pt discharged prior to [facility] evaluation.   Electronically signed by Maximino Greenland at 12/07/2021  2:54 PM EST

## 2021-12-07 NOTE — ED Notes (Signed)
Formatting of this note might be different from the original.  Assumed care of pt. Awaiting PERS assessment per pt request for stress.  Electronically signed by Martinez, Jennifer P, RN at 12/07/2021 10:09 AM EST

## 2021-12-07 NOTE — ED Provider Notes (Signed)
Formatting of this note might be different from the original.  Pt has declined PERS evaluation, he denies any SI or HI, has place to go. Will discharge home.     Nadara Eaton, PA-C  Electronically signed by Carole Civil, MD at 12/08/2021 11:19 PM EST

## 2021-12-07 NOTE — ED Notes (Signed)
Formatting of this note might be different from the original.  Clinical research associate huddled with charge nurse, ED Company secretary about pt preferring to stay in [facility] as opposed to in the [facility]. Pt has not been presenting as a danger to himself or others, but has been wandering to the main hospital and outside to smoke.     Pt states he does not want to come back into the [facility] and doesn't want to talk to PERS. Pt states he needs help with getting on a computer to get an code he needs to cash his disability checks. Offered to find him a [employer] and bus pass, which he accepted. Discussed with PA, who is ok to discharge with this plan.  Electronically signed by Levester Fresh, RN at 12/07/2021  2:39 PM EST

## 2021-12-07 NOTE — ED Notes (Signed)
Formatting of this note might be different from the original.  Patient refused blood draw at this time. Stated "dont want it dont need it"    Explained why it was needed and he said "no blood work"    Producer, television/film/video by Michaela Corner at 12/07/2021  3:10 AM EST

## 2021-12-07 NOTE — ED Notes (Signed)
Formatting of this note might be different from the original.  Pain assessment on discharge was n/a.  Condition stable.  Patient discharged to home.  Patient education was completed:  yes  Education taught to:  patient  Teaching method used was discussion.  Understanding of teaching was good.  Patient was discharged ambulatory.  Discharged with self.  Valuables were given to: patient.    Electronically signed by Levester Fresh, RN at 12/07/2021  2:50 PM EST

## 2021-12-07 NOTE — ED Provider Notes (Signed)
Formatting of this note is different from the original.    [facility] EMERGENCY DEPARTMENT    Time of Arrival:   12/07/21 0147    Final diagnoses:   [F43.0] Stress reaction (Primary)   [F20.9] Schizophrenia, unspecified type [facility])     Medical Decision Making:      Differential Diagnosis:   malingering, AH, VH, psychosis, medication non compliance    Social Determinants of Health:  no primary care provider                               Supplemental Historians include:  patient    ED Course:     Patient here stating that he needs to speak to a counselor as he has been having increased stress recently.  He denies any SI or HI.  Also denies any auditory and visual hallucinations.  He knows he supposed be on medications but states that he cannot afford them and is not taking any at the moment.  Although he states he has somewhere to stay he is quite disheveled and I query whether or not he may actually be homeless.    ED Course as of 12/07/21 0808   Tue Dec 07, 2021   0615 ETHANOL SERUM: <=0.01 [OA]   0615 Amphetamine Screen: NDET [OA]   0615 Opiate Screen: NDET [OA]   5366 Cocaine Screen: NDET [OA]   0615 Cannabinoids Screen: NDET [OA]   0615 Barbiturates Screen Urine: NDET [OA]   0615 Benzodiazepine Screen: NDET [OA]   4403 Hydrocodone Screen Urine: NDET [OA]   4742 Oxycodone Screen: NDET [OA]   5956 6-Acetylmorphine Screen: NDET [OA]     Documentation/Prior Results Review:  Old medical records, Nursing notes    Multiple ED visits for various complaints requesting psychiatric evaluation.  Reviewed ED visits from 11/05,11/09,11/11,11/15    Psych admission 11/07/2021 to 11/09/2021 - supposed to be on risperidone 1mg  BID, hydroxyzine for anxiety, trazadone for sleep    Rhythm interpretation from monitor: N/A    Imaging Interpreted by me: Not Applicable    No orders to display     .     Discussion of Management with other Physicians, QHP or Appropriate Source:   PERS Sabrina - in consideration for  escalation of care/psychiatric admission    .    Disposition:   PERS    New Prescriptions    No medications on file     Chief Complaint   Patient presents with    [facility] EVALUATION (SCREENING)     Zachary Mcintyre is a 46 y.o. male with h/o schizophrenia here to speak with a counselor.  States staying in a shelter, but has been stressed out and states he can't function because of the stress level.  He denies AH and VH.  Denies SI and HI as well.  Drinks alcohol and smokes marijuana occasionally.  Smokes cigarettes daily.  "I just need a couple of days to get admitted and get better."        Review of Systems:  Constitutional:  Negative for fever.   HENT:  Negative for congestion and sore throat.    Gastrointestinal:  Negative for vomiting and diarrhea.   Skin:  Negative for rash.        See HPI   Hematological:  does not bruise/bleed easily.   Psychiatric/Behavioral:  Positive for substance abuse. Negative for suicidal ideas, hallucinations and thougths of harming others.  Physical Exam  Vitals and nursing note reviewed.   Constitutional:       Appearance: He is not toxic-appearing.      Comments: Sitting in recliner, disheveled   HENT:      Head: Normocephalic.   Eyes:      General: No scleral icterus.     Extraocular Movements: Extraocular movements intact.      Pupils: Pupils are equal, round, and reactive to light.   Cardiovascular:      Rate and Rhythm: Normal rate and regular rhythm.      Heart sounds: Normal heart sounds. No murmur heard.     No friction rub. No gallop.   Pulmonary:      Effort: Pulmonary effort is normal. No respiratory distress.      Breath sounds: Normal breath sounds. No wheezing.   Abdominal:      General: Bowel sounds are normal. There is no distension.      Palpations: Abdomen is soft.      Tenderness: There is no abdominal tenderness.   Musculoskeletal:      Cervical back: Normal range of motion and neck supple.      Right lower leg: No edema.      Left lower leg: No edema.   Skin:      General: Skin is warm and dry.   Neurological:      Mental Status: He is alert.     Past Medical History:   Diagnosis Date    Schizophrenia, unspecified (HCC)      Past Surgical History:   Procedure Laterality Date    NO SURGICAL PROCEDURES PERFORMED       Family History   Family history unknown: Yes     Social History     Occupational History    Not on file   Tobacco Use    Smoking status: Every Day    Smokeless tobacco: Never   Substance and Sexual Activity    Alcohol use: Yes    Drug use: Not Currently    Sexual activity: Not on file     No outpatient medications have been marked as taking for the 12/07/21 encounter [facility] Encounter).     No Known Allergies    Vital Signs:  Patient Vitals for the past 72 hrs:   Temp Heart Rate Resp BP BP Mean SpO2 Weight   12/07/21 0153 -- -- -- -- -- -- 98.6 kg (217 lb 6 oz)   12/07/21 0152 98.3 F (36.8 C) 90 20 148/73 98 MM HG 99 % --     Diagnostics:  Labs:    Results for orders placed or performed during the hospital encounter of 12/07/21   ALCOHOL ETHYL   Result Value Ref Range    ETHANOL SERUM <=0.01 <=0.02 g/dL   Urine Drug Screen 9   Result Value Ref Range    Amphetamine Screen NDET NDET    Opiate Screen NDET NDET    Cocaine Screen NDET NDET    Cannabinoids Screen NDET NDET    Barbiturates Screen Urine NDET NDET    Benzodiazepine Screen NDET NDET    Hydrocodone Screen Urine NDET NDET    Oxycodone Screen NDET NDET    6-Acetylmorphine Screen NDET NDET    pH Urine Drug 6.0 4.5 - 8.0    SPECIFIC GRAVITY URINE 1.015 1.003 - 1.030     ECG:  No results found for this visit on 12/07/21.    Medications ordered/given in the ED  Medications -  No data to display    Dr. Dolores Patty  Electronically signed by Shelda Pal, MD at 12/07/2021 11:23 AM EST

## 2021-12-07 NOTE — Progress Notes (Signed)
Formatting of this note might be different from the original.  PERS Note:  Responded. Consult requested by PA Esperanza Sheets for Pt presenting with a history of schizophrenia and feeling stressed; requesting to speak with someone.  Pt denies SI, HI, and A/V hallucinations. Pt has a history of trespassing when D/C and security usually has to get involved. It is believed he is homeless and at the ED for secondary gain.  PERS to see as soon as possible.       Sabrina L. High, LPC, CSAC, CADC  PERS Clinician  [phone]   Electronically signed by High, Sabrina at 12/07/2021  6:16 AM EST

## 2021-12-07 NOTE — ED Notes (Signed)
Formatting of this note might be different from the original.  Pt provided with lunch tray. Pt has been wandering outside to smoke despite being told by staff and security it is against the policy.    Electronically signed by Janace Aris, RN at 12/07/2021  1:50 PM EST

## 2021-12-07 NOTE — ED Notes (Signed)
Formatting of this note might be different from the original.  Pt provided with lunch tray. Pt has been wandering outside to smoke despite being told by staff and security it is against the policy.    Electronically signed by Levester Fresh, RN at 12/07/2021  1:50 PM EST

## 2021-12-07 NOTE — Consults (Signed)
Associated Order(s): CONSULT TO PERS  Formatting of this note might be different from the original.  PERS Consult Complete: Pt discharged prior to [facility] evaluation.   Electronically signed by Thurmond Butts at 12/07/2021  2:54 PM EST

## 2021-12-07 NOTE — ED Triage Notes (Signed)
Formatting of this note might be different from the original.  Brought in by [facility] states his whole body hurts aching.  Feels stressed for 3 months.  Psych history.  Denies any SI or HI.  Electronically signed by Idell Pickles, RN at 12/07/2021  1:52 AM EST

## 2021-12-07 NOTE — Progress Notes (Signed)
Formatting of this note might be different from the original.  Telepsych requested    Telepsych setup pending, PERS will attempt again as soon as possible  Electronically signed by Loraine Maple at 12/07/2021 12:54 PM EST

## 2021-12-07 NOTE — Progress Notes (Signed)
Formatting of this note might be different from the original.  PERS Note:  Responded. Consult requested by PA Harriette Ohara for Pt presenting with a history of schizophrenia and feeling stressed; requesting to speak with someone.  Pt denies SI, HI, and A/V hallucinations. Pt has a history of trespassing when D/C and security usually has to get involved. It is believed he is homeless and at the ED for secondary gain.  PERS to see as soon as possible.       Sabrina L. High, LPC, CSAC, CADC  PERS Clinician  [phone]   Electronically signed by High, Sabrina at 12/07/2021  6:16 AM EST

## 2021-12-11 ENCOUNTER — Inpatient Hospital Stay
Admit: 2021-12-11 | Discharge: 2021-12-11 | Disposition: A | Discharge status: Home / Self Care | Payer: MEDICAID | Attending: Emergency Medicine

## 2021-12-11 DIAGNOSIS — R52 Pain, unspecified: Secondary | ICD-10-CM

## 2021-12-11 LAB — POC CHEM 8
BUN: 22 mg/dl (ref 7–25)
Calcium, Ionized: 4.8 mg/dL (ref 4.40–5.40)
Chloride: 105 mEq/L (ref 98–107)
Creatinine: 0.9 mg/dl (ref 0.6–1.3)
Glucose: 113 mg/dL — ABNORMAL HIGH (ref 74–106)
Hematocrit: 46 % (ref 40–54)
Hemoglobin: 15.6 gm/dl (ref 13.0–17.2)
Potassium: 3.7 mEq/L (ref 3.5–4.9)
Sodium: 141 mEq/L (ref 136–145)
Total CO2: 26 mmol/L (ref 21–32)

## 2021-12-11 LAB — EKG 12-LEAD
Atrial Rate: 96 {beats}/min
Calculated P Axis: 67 degrees
Calculated R Axis: 61 degrees
Calculated T Axis: 44 degrees
DIAGNOSIS, 93000: NORMAL
P-R Interval: 174 ms
Q-T Interval: 348 ms
QRS Duration: 92 ms
QTC Calculation (Bezet): 439 ms
Ventricular Rate: 96 {beats}/min

## 2021-12-11 NOTE — ED Triage Notes (Signed)
Pt arrives via ems    Ambulatory  Pt is homeless  Pt has been having generalized bodyaches

## 2021-12-11 NOTE — ED Notes (Signed)
Pt walked outside appearing upset      Shelbia Scinto, Garvin Fila, RN  12/11/21 0430

## 2021-12-11 NOTE — ED Notes (Signed)
Pt walked outside appearing upset      Tayden Duran, Jearld Lesch, RN  12/11/21 0430

## 2021-12-11 NOTE — ED Notes (Signed)
Pt making requests for copies of signed forms at registration. Registration provided him with what he was requesting.      Lenda Kelp, RN  12/11/21 [phone]

## 2021-12-11 NOTE — ED Notes (Signed)
ETA of cab is 1000     Lenda Kelp, [place]  12/11/21 [phone]

## 2021-12-11 NOTE — ED Triage Notes (Signed)
Pt arrives via ems for all over pain. Per ems reports patient homeless and was picked up outside of [facility] for complaints of pain.

## 2021-12-11 NOTE — ED Notes (Signed)
6:09 AM  12/11/21    Discharge instructions given to patient with verbalization of understanding.  Pt accompanied by self.  Pt discharged with the following prescriptions      Medication List      You have not been prescribed any medications.     .  Pt discharged to home. Will attempt to arrange medicaid cab for patient     Shirlean Schlein, RN      Shirlean Schlein, RN  12/11/21 [phone]

## 2021-12-11 NOTE — ED Notes (Signed)
Pt. Given a Lunch box and a soda with ice     Laasia Arcos, CNA  12/11/21 0601

## 2021-12-11 NOTE — ED Provider Notes (Signed)
[facility]  Emergency Department Treatment Report        Patient: Zachary Mcintyre Age: 46 y.o. Sex: male    Date of Birth: July 21, 1975 Admit Date: 12/11/2021 PCP: No primary care provider on file.   MRN: [id number]  CSN: [id number]  ATTENDING: Despina Hick, MD   Room: T02/T02 Time Dictated: 6:06 AM APP: [facility]       Chief Complaint   Dizziness and generalized abdominal pain    History of Present Illness   This is a 46 y.o. male with a history of schizophrenia, tobacco dependence present to the Ed for feeling faint and dizzy for the last 3 days that comes and goes. He denies chest pain or shortness of breath. He denies fevers, chills, but is reporting nausea and vomiting today. He denies abdominal pain. He denies alcohol use. He is reporting pain all over his body for the last 3 weeks. Denies hallucination, homicidal ideation or suicidal ideation.     [facility]  so he could be admitted to psych ward for a few weeks.   Review of Systems   As outlined in HPI  Past Medical/Surgical History     Past Medical History:   Diagnosis Date    Alcohol use disorder 11/08/2021    Mood disorder (HCC) 11/07/2021    Tobacco use disorder 11/08/2021     No past surgical history on file.    Social History     Social History     Socioeconomic History    Marital status: Unknown     Spouse name: Not on file    Number of children: Not on file    Years of education: Not on file    Highest education level: Not on file   Occupational History    Not on file   Tobacco Use    Smoking status: Not on file    Smokeless tobacco: Not on file   Substance and Sexual Activity    Alcohol use: Not on file    Drug use: Not on file    Sexual activity: Not on file   Other Topics Concern    Not on file   Social History Narrative    Not on file     Social Determinants of Health     Financial Resource Strain: Not on file   Food Insecurity: Not on file   Transportation Needs: Not on file   Physical  Activity: Not on file   Stress: Not on file   Social Connections: Not on file   Intimate Partner Violence: Not on file   Housing Stability: Not on file     Smoke 2 packs daily  Denies drug use.   Denies alcohol use.   Family History   Unknown.  Current Medications     Denies taking any medications.       Allergies   No Known Allergies    Physical Exam     ED Triage Vitals [12/11/21 0245]   Enc Vitals Group      BP (!) 138/107      Pulse 70      Respirations 18      Temp 98.1 F (36.7 C)      Temp Source Oral      SpO2 100 %      Weight       Height       Head Circumference       Peak Flow  Pain Score       Pain Loc       Pain Edu?       Excl. in GC?         Constitutional: Patient appears well developed and well nourished.  Appearance and behavior are age and situation appropriate. Very sleepy but awakes with verbal stimuli.  Patient smells of tobacco.  HEENT: Conjunctivae clear.  PERRL. Mucous membranes moist, non-erythematous.   Neck: supple, non tender, symmetrical, no masses or JVD.   Respiratory: lungs clear to auscultation, nonlabored respirations. No tachypnea or accessory muscle use.  Cardiovascular: heart regular rate and rhythm without murmur rubs or gallops. No peripheral edema or significant variscosities.  Distal pulses 2+ bilaterally: dorsalis pedis and radial.  Gastrointestinal:  Abdomen soft, nontender without complaint of pain to palpation. No guarding, distension or rigidity.   Musculoskeletal: Normal ROM, non-tender.  No obvious deformities, moves all extremities without difficulty  Integumentary: warm and dry without rashes or lesions  Neurologic: alert and oriented, Sensation intact, motor strength equal and symmetric. NO ataxia. No focal neurologic deficit. Cranial nerves intact. No drift of upper or lowe extremities. Finger to nose intact.   Impression and Management Plan   This is a 46 year old male presents the emergency department with several vague complaints including dizziness and  feeling like he is going to fall out for several days as well as myalgias for the past 3 weeks.  Differentials include but are not limited to dehydration, electrolyte abnormality, anemia, malingering.  Diagnostic Studies   Lab:   Recent Results (from the past 24 hour(s))   POC CHEM 8    Collection Time: 12/11/21  4:56 AM   Result Value Ref Range    Sodium 141 136 - 145 mEq/L    Potassium 3.7 3.5 - 4.9 mEq/L    Chloride 105 98 - 107 mEq/L    Total CO2 26 21 - 32 mmol/L    Glucose 113 (H) 74 - 106 mg/dL    BUN 22 7 - 25 mg/dl    Creatinine 0.9 0.6 - 1.3 mg/dl    Hematocrit 46 40 - 54 %    Hemoglobin 15.6 13.0 - 17.2 gm/dl    Calcium, Ionized 0.86 4.40 - 5.40 mg/dL        Imaging:    No results found.     EKG interpretation: as interpreted by myself and discussed with Dr. Candie Echevaria showed no acute ST segment or T wave abnormalities that are consistent with acute ischemia or infarction.  Normal sinus rhythm with a rate of 96 bpm, PR interval 174 MS, QRS duration 92 MS, QTc 439 MS.      Other studies:  My interpretation of other studies is that they show, among other things, i-STAT showed a glucose of 113 otherwise unremarkable normal hemoglobin hematocrit    ED Course/Medical Decision Making   RECORDS REVIEWED/EXTERNAL RESULTS REVIEWED:  I reviewed the patient's previous records here at [facility] and available outside facilities and note that patient was seen at [facility] on 12/07/[facility]21 and offered psych evaluation but patient left prior to evaluation.  Patient has multiple visits for pain in his joint foot and ankles.    Threat to body function without evaluation and management: Cardiac, dehydration      SOCIAL DETERMINANTS  impacting Evaluation and Management: Patient is homeless.     Patient's care has been impacted by health system issues including ED overcrowding, boarding, and functioning over capacity.    NARRATIVE:  Patient  remained completely stable throughout his stay here in the emergency department he had no focal  neurologic deficit he had no ataxia he was in no acute distress his vitals are reassuring his i-STAT and EKG are reassuring.  I believe there is a component of malingering to his visit.  He is homeless and that he expressed the desire to be treated for inpatient for a couple of weeks because he needs to figure out his home situation.  He has history of schizophrenia but he is not acutely manic he is not having any suicidal homicidal ideations he is not having any hallucinations I do not believe he meets any criteria for inpatient treatment.  I believe he is stable for discharge home to follow-up with his psych resources that have been provided for him as well as the homeless resources that have been previously provided for him.  He was given return precautions.      Final Diagnosis     1. Body aches    2. Dizziness    3. Homelessness         Disposition   Discharged home in stable condition.        The patient was personally evaluated by myself and discussed with Despina Hick, MD who agrees with the above assessment and plan        Cicero Duck  December 11, 2021    My signature above authenticates this document and my orders, the final    diagnosis (es), discharge prescription (s), and instructions in the Epic    record.  If you have any questions please contact [phone].     Nursing notes have been reviewed by the physician/ advanced practice    Clinician.      Dragon medical dictation software was used for portions of this report. Unintended voice recognition errors may occur.     Vivi Martens, [place]  12/11/21 [phone]

## 2021-12-11 NOTE — ED Notes (Signed)
ETA of cab is 1000     Shirlean Schlein, [place]  12/11/21 [phone]

## 2021-12-11 NOTE — ED Notes (Signed)
6:09 AM  12/11/21    Discharge instructions given to patient with verbalization of understanding.  Pt accompanied by self.  Pt discharged with the following prescriptions      Medication List      You have not been prescribed any medications.     .  Pt discharged to home. Will attempt to arrange medicaid cab for patient     Lenda Kelp, RN      Lenda Kelp, RN  12/11/21 [phone]

## 2021-12-13 NOTE — Progress Notes (Unsigned)
Zachary Mcintyre 46 y.o. male  presents to the office today for follow up on chronic conditions. He is accompanied by his wife.     Blood pressure 138/86, pulse 64, temperature 97.6 F (36.4 C), temperature source Temporal, resp. rate 16, height 1.727 m (5\' 8" ), weight 86.2 kg (190 lb), SpO2 99 %. Body mass index is 28.89 kg/m.   Chief Complaint   Patient presents with    Follow-up Chronic Condition    Diabetes    COPD    Depression    Cholesterol Problem      Pt reports he is doing okay. He has had some cardiac issues. He has angina. He has been seeing cardiologist, Dr. Dahlia Client and was started on Ranexa 500mg  BID. He had a stress test, holter monitor study, PET/CT, and echocardiogram. He has been having some dizziness and palpitations.     DM2: A1c per POC today 6.9, same as A1c of 6.9 on 06/07/21. He is not on diabetic medication. Hypoglycemia symptoms include dizziness and headaches. Pertinent negatives for hypoglycemia include no nervousness/anxiousness. Associated symptoms include chest pain.     Hyperlipidemia: Lipid panel on 03/02/21 was normal apart from elevated triglycerides at 202. Pt takes simvastatin 20mg  every day.      Hypertension: BP at office today 162/110 and 138/86 on manual recheck. Pt takes Lisinopril 40mg  daily. He doesn't check BP at home, but was told his BP was high during his last cardiologist visit. He believes worrying about his heart issues is increasing his blood pressure. He denies blurred vision. He has had some headaches, which he believes is from smelling paint fumes while painting his house.     Pt's former urologist, Dr. Corbin Ade has left their practice. Pt is looking for a recommendation for a another urologist.     Pt also has some knee pain, which he attributes to old age.     COPD: There is no cough or shortness of breath. Associated symptoms include chest pain and headaches. Pertinent negatives include no fever.     Depression: Patient is not experiencing:  nervousness/anxiety, palpitations and shortness of breath.      Health Maintenance:   Flu Shot: given today    Current Outpatient Medications   Medication Sig Dispense Refill    ranolazine (RANEXA) 500 MG extended release tablet Take 1 tablet by mouth 2 times daily      memantine (NAMENDA) 10 MG tablet Take 1 tablet by mouth 2 times daily 180 tablet 1    Selenium 100 MCG TABS Take by mouth      aspirin 81 MG EC tablet Take 1 tablet by mouth daily      cetirizine (ZYRTEC) 10 MG tablet Take by mouth      coenzyme Q10 100 MG CAPS capsule Take 1 capsule by mouth daily      isosorbide mononitrate (IMDUR) 30 MG extended release tablet TAKE ONE TABLET BY MOUTH DAILY      lisinopril (PRINIVIL;ZESTRIL) 40 MG tablet TAKE ONE TABLET BY MOUTH DAILY      magnesium oxide (MAG-OX) 400 MG tablet TAKE ONE TABLET BY MOUTH DAILY      simvastatin (ZOCOR) 20 MG tablet Take 1 tablet by mouth nightly 90 tablet 1     No current facility-administered medications for this visit.     Allergies   Allergen Reactions    Penicillins Rash     Past Medical History:   Diagnosis Date    Acid indigestion  Anxiety     Arthritis     Asthma     CAD (coronary artery disease)     Calculus of kidney     20 stones    Chronic obstructive pulmonary disease (HCC)     being worked up for COPD    Depression     ED (erectile dysfunction)     Enlarged prostate     Gastrointestinal disorder     Hypercholesterolemia     Hypertension     S/P cholecystectomy 12/17/2019    Angelica Pou     Ureterolithiasis      Past Surgical History:   Procedure Laterality Date    APPENDECTOMY  07/07/2016    laparoscopic appendectomy by Dr. Marton Redwood.    COLONOSCOPY N/A 05/04/2016    COLONOSCOPY performed by Jennings Books, MD at [facility] ENDOSCOPY    GI      COLONOSCOPY    ORTHOPEDIC SURGERY Right 09/22/2017    carpaltunel    UROLOGICAL SURGERY      removed kidney stones x 2    VASECTOMY       Family History   Problem Relation Age of Onset    Cancer Mother         breast  cancer    Hypertension Mother     Diabetes Mother     Diabetes Father     Lung Disease Sister         HAD DOUBLE LUNG TRANSPLANT     Social History     Tobacco Use    Smoking status: Former     Packs/day: 1.00     Years: 15.00     Additional pack years: 0.00     Total pack years: 15.00     Types: Cigarettes     Start date: 01/1974     Quit date: 05/17/2016     Years since quitting: 5.5     Passive exposure: Past    Smokeless tobacco: Never   Substance Use Topics    Alcohol use: No        Review of Systems   Constitutional:  Negative for chills and fever.   HENT:  Negative for hearing loss and tinnitus.    Eyes:  Negative for visual disturbance.   Respiratory:  Negative for cough and shortness of breath.    Cardiovascular:  Positive for chest pain. Negative for palpitations.   Gastrointestinal:  Negative for nausea and vomiting.   Genitourinary:  Negative for dysuria and frequency.   Musculoskeletal:  Positive for arthralgias (BL knee pain). Negative for back pain.   Skin:  Negative for rash.   Allergic/Immunologic: Negative for environmental allergies.   Neurological:  Positive for dizziness and headaches. Negative for syncope.   Psychiatric/Behavioral:  Negative for dysphoric mood. The patient is not nervous/anxious.          Physical Exam  Vitals reviewed.   Constitutional:       Appearance: Normal appearance.   HENT:      Head: Normocephalic and atraumatic.      Right Ear: Tympanic membrane, ear canal and external ear normal.      Left Ear: Tympanic membrane, ear canal and external ear normal.      Nose: Nose normal.      Mouth/Throat:      Mouth: Mucous membranes are moist.      Pharynx: Oropharynx is clear.   Eyes:      Extraocular Movements: Extraocular movements intact.  Conjunctiva/sclera: Conjunctivae normal.      Pupils: Pupils are equal, round, and reactive to light.   Cardiovascular:      Rate and Rhythm: Normal rate. Rhythm irregular.      Pulses: Normal pulses.      Heart sounds: Normal heart  sounds.   Pulmonary:      Effort: Pulmonary effort is normal.      Breath sounds: Normal breath sounds.   Abdominal:      General: Abdomen is flat. Bowel sounds are normal.      Palpations: Abdomen is soft.   Musculoskeletal:         General: Normal range of motion.      Cervical back: Normal range of motion and neck supple.   Skin:     General: Skin is warm and dry.   Neurological:      General: No focal deficit present.      Mental Status: He is alert and oriented to person, place, and time.   Psychiatric:         Mood and Affect: Mood normal.         Behavior: Behavior normal.         Judgment: Judgment normal.         ASSESSMENT and PLAN  Zachary Mcintyre was seen today for follow-up chronic condition, diabetes, copd, depression and cholesterol problem.    Diagnoses and all orders for this visit:    Type 2 diabetes mellitus with hyperglycemia, with long-term current use of insulin (HCC)  A1c is stable at 6.9. Pt will continue current regimen. Recheck CMP.   -     AMB POC HEMOGLOBIN A1C  -     Comprehensive Metabolic Panel; Future    Essential (primary) hypertension  BP was elevated when first checked today. I advised pt to start checking his BP at home and send the readings via MyChart in 2 weeks. He will continue with lisinopril 40mg  daily for now. His wife has an appointment here tomorrow and will bring their BP cuff in for a comparison reading. We also discussed working on weight loss through diet modifications. Pt will have updated lab work performed during today's OV.   -     Comprehensive Metabolic Panel; Future  -     CBC; Future    Mixed hyperlipidemia  Presumed stable, will recheck labs today. Pt will continue current regimen. Pt requests a refill of their medication, which I have granted.  -     simvastatin (ZOCOR) 20 MG tablet; Take 1 tablet by mouth nightly  -     Lipid Panel; Future    Coronary artery disease involving native coronary artery of native heart with unstable angina pectoris (HCC)  Followed by  cardiologist, Dr. Dahlia Client. I advised pt to take it easy while his cardiac issues are being evaluated. He has been climbing a ladder to paint his house. I advised against doing this since he has been have some dizziness and a fall from a ladder could result in serious injury. Recheck CMP.         -     Comprehensive Metabolic Panel; Future     Cardiac arrhythmia, unspecified cardiac arrhythmia type  See above.     Encounter for screening for malignant neoplasm of prostate  His urologist, Dr. Corbin Ade has left their practice. I recommended seeing Dr. Eulah Pont. Will recheck PSA today.   -     AFL - Bruna Potter, MD, Urology, [facility] [address])  -  PSA Screening; Future    Primary osteoarthritis of both knees  He will continue to manage this on his own as he has been. When he reaches the point of wanting to discuss getting the knees replaced, he will let me know. I also encouraged him to work on weight loss.     Medication risks/benefits/costs/interactions/alternatives discussed with patient.  Advised patient to call back or return to office if symptoms worsen/change/persist.  If patient cannot reach [place] or should anything more severe/urgent arise he/she should proceed directly to the nearest emergency department.  Discussed expected course/resolution/complications of diagnosis in detail with patient.    Patient given a written after visit summary which includes diagnoses, current medications and vitals.  Patient expressed understanding with the diagnosis and plan.    Written by Casimiro Needle, scribe, as dictated by Shelle Iron, M.D.    Total time spent with the patient 20 minutes, greater than 50% of time spent counseling patient.

## 2023-09-11 ENCOUNTER — Emergency Department: Payer: Medicaid (Managed Care)

## 2023-09-11 ENCOUNTER — Inpatient Hospital Stay
Admit: 2023-09-11 | Discharge: 2023-09-12 | Disposition: A | Discharge status: Home / Self Care | Payer: Medicaid (Managed Care) | Arrived: VH

## 2023-09-11 DIAGNOSIS — L84 Corns and callosities: Principal | ICD-10-CM

## 2023-09-11 NOTE — ED Provider Notes (Signed)
 [facility] EMERGENCY DEPARTMENT  EMERGENCY DEPARTMENT ENCOUNTER       Pt Name: Zachary Mcintyre  MRN: [id number]  Birthdate 04/03/1975  Date of evaluation: 09/11/2023  PCP: No primary care provider on file.  Note Started: 9:01 PM 09/11/23     CHIEF COMPLAINT       Chief Complaint   Patient presents with    Leg Pain        HISTORY OF PRESENT ILLNESS: 1 or more elements      History From: Patient  HPI Limitations: None  Chronic Conditions: Schizophrenia, depression, EtOH use  Social Determinants affecting Dx or Tx: none       Omarius Grantham is a 48 y.o. male with history of schizophrenia who presents to ED c/o left foot pain.  Patient states I think it is broken but is unable to cite injury or localize pain well.  Patient is also asking for a rehab center for homelessness.  No severe headache, dizziness, vision changes, chest pain or shortness of breath, focal weakness or paresthesias.     Nursing Notes were all reviewed and agreed with or any disagreements were addressed in the HPI.    PAST HISTORY     Past Medical History:  Past Medical History:   Diagnosis Date    Alcohol use disorder 11/08/2021    Mood disorder 11/07/2021    Tobacco use disorder 11/08/2021       Past Surgical History:  No past surgical history on file.    Family History:  No family history on file.    Social History:  Social History     Socioeconomic History    Marital status: Single     Social Drivers of Health     Food Insecurity: Patient Unable To Answer (10/24/2022)    Received from [facility]    Hunger Vital Sign     Within the past 12 months, you worried that your food would run out before you got the money to buy more.: Patient unable to answer     Within the past 12 months, the food you bought just didn't last and you didn't have money to get more.: Patient unable to answer   Transportation Needs: Patient Unable To Answer (10/24/2022)    Received from [facility] - Transportation     Lack of Transportation (Medical): Patient  unable to answer     Lack of Transportation (Non-Medical): Patient unable to answer       Allergies:  No Known Allergies    CURRENT MEDICATIONS      No current facility-administered medications for this encounter.     No current outpatient medications on file.          PHYSICAL EXAM      Vitals:    09/11/23 1853 09/11/23 2030   BP: (!) 173/135 114/84   Pulse: 94 95   Resp: 18    Temp: 97.5 F (36.4 C)    TempSrc: Tympanic    SpO2: 99%      Physical Exam  Vitals and nursing note reviewed.   Constitutional:       General: He is not in acute distress.     Appearance: He is not toxic-appearing.      Comments: Patient is unkempt and poorly groomed   HENT:      Head: Normocephalic and atraumatic.      Right Ear: External ear normal.      Left Ear: External ear normal.  Nose: Nose normal.      Mouth/Throat:      Mouth: Mucous membranes are moist.   Eyes:      Extraocular Movements: Extraocular movements intact.      Conjunctiva/sclera: Conjunctivae normal.   Cardiovascular:      Pulses: Normal pulses.   Pulmonary:      Effort: Pulmonary effort is normal.   Musculoskeletal:         General: Normal range of motion.      Cervical back: Normal range of motion and neck supple.      Comments: Generalized calluses to feet and onychomycosis toenails, no erythema or warmth, no visualized swelling or wound   Skin:     General: Skin is warm and dry.      Capillary Refill: Capillary refill takes less than 2 seconds.   Neurological:      General: No focal deficit present.      Mental Status: He is alert. Mental status is at baseline.   Psychiatric:         Mood and Affect: Affect is inappropriate.         Behavior: Behavior is uncooperative.         Thought Content: Thought content does not include homicidal or suicidal plan.      Comments: Patient is uncooperative, inappropriate affect, answering questions angrily and refusing workup.              DIAGNOSTIC RESULTS   LABS:    No results found for this or any previous visit (from  the past 24 hours).    Labs Reviewed - No data to display              PROCEDURES   Unless otherwise noted below, none  Procedures         CRITICAL CARE TIME   None     EMERGENCY DEPARTMENT COURSE and DIFFERENTIAL DIAGNOSIS/MDM   Vitals:    Vitals:    09/11/23 1853 09/11/23 2030   BP: (!) 173/135 114/84   Pulse: 94 95   Resp: 18    Temp: 97.5 F (36.4 C)    TempSrc: Tympanic    SpO2: 99%        Patient was given the following medications:  Medications - No data to display        Records Reviewed (source and summary): Old medical records.  Nursing notes.    CLINICAL MANAGEMENT TOOLS:        ED COURSE       Medial Decision Making:  DDX: Fracture, dislocation, sprain/strain, cellulitis, gout, plantar fasciitis, OA, malingering behavior    Alert, unkempt but nontoxic-appearing 48 y.o. male  with history of schizophrenia who presents to ED c/o left foot pain.  Patient states I think it is broken but is unable to cite injury or localize pain well.  Patient is also asking for a rehab center for homelessness. In evaluation of the above differential diagnosis, consideration was given to the following tests and treatments: Patient is in no acute distress, vital signs are stable.  BP markedly elevated in triage, this had resolved on recheck.  Patient is unkempt and poorly groomed, no obvious infection or injury to foot.  Patient is refusing workup.  Patient states I just need to rest my foot.  Patient was given a meal tray.  Upon review of patient's chart, he has been seen in multiple ERs and has been given housing resources, most recently last week at [facility].  As it is after hours, case management is not currently available to evaluate patient.  I discussed each of these tests and considerations with the patient. They agree with the plan of discharge and outpatient follow-up with case management.  Return precautions given.      FINAL IMPRESSION     1. Left foot pain    2. Homelessness            DISPOSITION/PLAN    DISPOSITION Decision To Discharge 09/11/2023 08:54:57 PM   DISPOSITION CONDITION Stable                PATIENT REFERRED TO:  [facility]  [address]  [place]  [id number]  [phone]  Schedule an appointment as soon as possible for a visit   For ER follow-up    [facility] Emergency Department  [address]  [place]  [phone]  [phone]    As needed, If symptoms worsen         DISCHARGE MEDICATIONS:     Medication List      You have not been prescribed any medications.              I am the Primary Clinician of Record.       (Please note that parts of this dictation were completed with voice recognition software. Quite often unanticipated grammatical, syntax, homophones, and other interpretive errors are inadvertently transcribed by the computer software. Please disregards these errors. Please excuse any errors that have escaped final proofreading.)      Halina Annabella SAUNDERS, APRN - NP  09/11/23 2101

## 2023-09-11 NOTE — ED Triage Notes (Signed)
 Pt arrived to ED with complaints of a broken leg. Pt ambulatory to triage
